# Patient Record
Sex: Male | Born: 1950 | Race: White | Hispanic: No | Marital: Married | Smoking: Never smoker
Health system: Southern US, Community
[De-identification: ages and names within clinical notes are randomized; demographics above are authoritative.]

## PROBLEM LIST (undated history)

## (undated) DIAGNOSIS — R32 Unspecified urinary incontinence: Secondary | ICD-10-CM

## (undated) DIAGNOSIS — Z86711 Personal history of pulmonary embolism: Secondary | ICD-10-CM

## (undated) DIAGNOSIS — Z8719 Personal history of other diseases of the digestive system: Secondary | ICD-10-CM

## (undated) DIAGNOSIS — T7840XA Allergy, unspecified, initial encounter: Secondary | ICD-10-CM

## (undated) DIAGNOSIS — J45909 Unspecified asthma, uncomplicated: Secondary | ICD-10-CM

## (undated) DIAGNOSIS — Z8711 Personal history of peptic ulcer disease: Secondary | ICD-10-CM

## (undated) DIAGNOSIS — R569 Unspecified convulsions: Secondary | ICD-10-CM

## (undated) DIAGNOSIS — J449 Chronic obstructive pulmonary disease, unspecified: Secondary | ICD-10-CM

## (undated) DIAGNOSIS — M1990 Unspecified osteoarthritis, unspecified site: Secondary | ICD-10-CM

## (undated) DIAGNOSIS — IMO0002 Reserved for concepts with insufficient information to code with codable children: Secondary | ICD-10-CM

## (undated) DIAGNOSIS — J189 Pneumonia, unspecified organism: Secondary | ICD-10-CM

## (undated) DIAGNOSIS — Z8619 Personal history of other infectious and parasitic diseases: Secondary | ICD-10-CM

## (undated) DIAGNOSIS — K635 Polyp of colon: Secondary | ICD-10-CM

## (undated) DIAGNOSIS — G4730 Sleep apnea, unspecified: Secondary | ICD-10-CM

## (undated) DIAGNOSIS — G709 Myoneural disorder, unspecified: Secondary | ICD-10-CM

## (undated) DIAGNOSIS — C801 Malignant (primary) neoplasm, unspecified: Secondary | ICD-10-CM

## (undated) DIAGNOSIS — R911 Solitary pulmonary nodule: Secondary | ICD-10-CM

## (undated) DIAGNOSIS — M8580 Other specified disorders of bone density and structure, unspecified site: Secondary | ICD-10-CM

## (undated) DIAGNOSIS — K219 Gastro-esophageal reflux disease without esophagitis: Secondary | ICD-10-CM

## (undated) DIAGNOSIS — H269 Unspecified cataract: Secondary | ICD-10-CM

## (undated) DIAGNOSIS — D689 Coagulation defect, unspecified: Secondary | ICD-10-CM

## (undated) DIAGNOSIS — I10 Essential (primary) hypertension: Secondary | ICD-10-CM

## (undated) DIAGNOSIS — M109 Gout, unspecified: Secondary | ICD-10-CM

## (undated) DIAGNOSIS — E039 Hypothyroidism, unspecified: Secondary | ICD-10-CM

## (undated) DIAGNOSIS — E079 Disorder of thyroid, unspecified: Secondary | ICD-10-CM

## (undated) DIAGNOSIS — E7800 Pure hypercholesterolemia, unspecified: Secondary | ICD-10-CM

## (undated) DIAGNOSIS — G4733 Obstructive sleep apnea (adult) (pediatric): Secondary | ICD-10-CM

## (undated) DIAGNOSIS — Z5189 Encounter for other specified aftercare: Secondary | ICD-10-CM

## (undated) HISTORY — PX: TOOTH EXTRACTION: SUR596

## (undated) HISTORY — DX: Polyp of colon: K63.5

## (undated) HISTORY — PX: JOINT REPLACEMENT: SHX530

## (undated) HISTORY — PX: COLONOSCOPY: SHX174

## (undated) HISTORY — PX: COSMETIC SURGERY: SHX468

## (undated) HISTORY — PX: HIP SURGERY: SHX245

## (undated) HISTORY — DX: Encounter for other specified aftercare: Z51.89

## (undated) HISTORY — DX: Other specified disorders of bone density and structure, unspecified site: M85.80

## (undated) HISTORY — DX: Hypothyroidism, unspecified: E03.9

## (undated) HISTORY — DX: Essential (primary) hypertension: I10

## (undated) HISTORY — DX: Solitary pulmonary nodule: R91.1

## (undated) HISTORY — DX: Unspecified osteoarthritis, unspecified site: M19.90

## (undated) HISTORY — DX: Personal history of other diseases of the digestive system: Z87.19

## (undated) HISTORY — DX: Personal history of pulmonary embolism: Z86.711

## (undated) HISTORY — DX: Pneumonia, unspecified organism: J18.9

## (undated) HISTORY — DX: Myoneural disorder, unspecified: G70.9

## (undated) HISTORY — DX: Personal history of peptic ulcer disease: Z87.11

## (undated) HISTORY — DX: Unspecified urinary incontinence: R32

## (undated) HISTORY — DX: Personal history of other infectious and parasitic diseases: Z86.19

## (undated) HISTORY — DX: Allergy, unspecified, initial encounter: T78.40XA

## (undated) HISTORY — DX: Reserved for concepts with insufficient information to code with codable children: IMO0002

## (undated) HISTORY — DX: Gastro-esophageal reflux disease without esophagitis: K21.9

## (undated) HISTORY — PX: SINOSCOPY: SHX187

## (undated) HISTORY — DX: Unspecified cataract: H26.9

## (undated) HISTORY — DX: Sleep apnea, unspecified: G47.30

## (undated) HISTORY — PX: HERNIA REPAIR: SHX51

## (undated) HISTORY — PX: COLON SURGERY: SHX602

## (undated) HISTORY — PX: BRAIN SURGERY: SHX531

## (undated) HISTORY — DX: Unspecified convulsions: R56.9

## (undated) HISTORY — DX: Chronic obstructive pulmonary disease, unspecified: J44.9

## (undated) HISTORY — DX: Obstructive sleep apnea (adult) (pediatric): G47.33

## (undated) HISTORY — DX: Coagulation defect, unspecified: D68.9

---

## 1998-10-26 ENCOUNTER — Ambulatory Visit (HOSPITAL_COMMUNITY): Admission: RE | Admit: 1998-10-26 | Discharge: 1998-10-26 | Payer: Self-pay | Admitting: *Deleted

## 1998-10-29 ENCOUNTER — Encounter: Payer: Self-pay | Admitting: *Deleted

## 1998-10-29 ENCOUNTER — Ambulatory Visit (HOSPITAL_COMMUNITY): Admission: RE | Admit: 1998-10-29 | Discharge: 1998-10-29 | Payer: Self-pay | Admitting: *Deleted

## 1999-04-26 HISTORY — PX: CHOLECYSTECTOMY: SHX55

## 2002-12-13 ENCOUNTER — Observation Stay (HOSPITAL_COMMUNITY): Admission: EM | Admit: 2002-12-13 | Discharge: 2002-12-14 | Payer: Self-pay | Admitting: Emergency Medicine

## 2002-12-13 ENCOUNTER — Encounter: Payer: Self-pay | Admitting: Emergency Medicine

## 2002-12-13 ENCOUNTER — Encounter (INDEPENDENT_AMBULATORY_CARE_PROVIDER_SITE_OTHER): Payer: Self-pay | Admitting: *Deleted

## 2002-12-13 ENCOUNTER — Encounter: Payer: Self-pay | Admitting: General Surgery

## 2002-12-16 ENCOUNTER — Inpatient Hospital Stay (HOSPITAL_COMMUNITY): Admission: EM | Admit: 2002-12-16 | Discharge: 2002-12-19 | Payer: Self-pay | Admitting: Emergency Medicine

## 2002-12-16 ENCOUNTER — Encounter: Payer: Self-pay | Admitting: Emergency Medicine

## 2002-12-17 ENCOUNTER — Encounter (INDEPENDENT_AMBULATORY_CARE_PROVIDER_SITE_OTHER): Payer: Self-pay | Admitting: *Deleted

## 2005-10-05 ENCOUNTER — Encounter (INDEPENDENT_AMBULATORY_CARE_PROVIDER_SITE_OTHER): Payer: Self-pay | Admitting: Specialist

## 2005-10-05 ENCOUNTER — Ambulatory Visit (HOSPITAL_COMMUNITY): Admission: RE | Admit: 2005-10-05 | Discharge: 2005-10-05 | Payer: Self-pay | Admitting: *Deleted

## 2007-04-26 HISTORY — PX: PROSTATECTOMY: SHX69

## 2007-07-05 ENCOUNTER — Encounter: Payer: Self-pay | Admitting: Urology

## 2007-07-05 ENCOUNTER — Inpatient Hospital Stay (HOSPITAL_COMMUNITY): Admission: RE | Admit: 2007-07-05 | Discharge: 2007-07-06 | Payer: Self-pay | Admitting: Urology

## 2007-07-17 ENCOUNTER — Ambulatory Visit (HOSPITAL_COMMUNITY): Admission: RE | Admit: 2007-07-17 | Discharge: 2007-07-17 | Payer: Self-pay | Admitting: Urology

## 2010-09-07 NOTE — Op Note (Signed)
NAME:  [name], [name]             ACCOUNT NO.:  [id number]   MEDICAL RECORD NO.:  [id number]          PATIENT TYPE:  INP   LOCATION:  0001                         FACILITY:  [facility]   PHYSICIAN:  Thornton Park. Daphine Deutscher, MD  DATE OF BIRTH:  1950-11-18   DATE OF PROCEDURE:  07/05/2007  DATE OF DISCHARGE:                               OPERATIVE REPORT   PREOPERATIVE DIAGNOSIS:  Umbilical hernia.   POSTOPERATIVE DIAGNOSIS:  Umbilical hernia with previous port site  hernia   DESCRIPTION OF PROCEDURE:  Mr. Summerville had undergone a robotic  prostatectomy and after Dr. Annabell Howells had extracted the specimen through the  supraumbilical incision this was extended down into the hernia.  I went  ahead and excised the hernia sac which was beneath the umbilical skin.  Because of the prostatectomy  and although slight chance of  contamination, I  elected to go ahead and close this hernia primarily  rather than put in artificial material.  The edges were well delineated  and were closed with interrupted zero Prolene sutures.  I checked to  make sure nothing was incarcerated prior to doing the final closure, but  all these were tied down and had a nice secure closure present.  The  area was irrigated.  It was infiltrated with half percent Marcaine with  epinephrine and then closed with a stapler.  The patient was taken to  the recovery room in satisfactory condition.      Thornton Park Daphine Deutscher, MD  Electronically Signed     MBM/MEDQ  D:  07/05/2007  T:  07/06/2007  Job:  [id number]

## 2010-09-07 NOTE — Op Note (Signed)
NAME:  Michael [name], Michael [name]             ACCOUNT NO.:  [id number]   MEDICAL RECORD NO.:  [id number]          PATIENT TYPE:  INP   LOCATION:  1436                         FACILITY:  [facility]   PHYSICIAN:  Excell Seltzer. Annabell Howells, M.D.    DATE OF BIRTH:  May 30, 1950   DATE OF PROCEDURE:  07/05/2007  DATE OF DISCHARGE:  07/06/2007                               OPERATIVE REPORT   PROCEDURE:  Robot-assisted laparoscopic radical prostatectomy.   PREOPERATIVE DIAGNOSIS:  Prostate cancer.   POSTOPERATIVE DIAGNOSIS:  Prostate cancer.   SURGEON:  Bjorn Pippin, MD   ASSISTANT:  Heloise Purpura, MD   ANESTHESIA:  General.   SPECIMEN:  Prostate and seminal vesicles.   DRAIN:  A 20-French Foley catheter and #10 Blake drain.   BLOOD LOSS:  350 mL.   COMPLICATIONS:  None.   INDICATIONS:  Michael [name] is a 60 year old white male who was diagnosed  with prostate cancer following an elevated PSA.  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  He is potent and nerve-sparing prostatectomy was chosen for  therapy.   FINDINGS AND PROCEDURE:  The patient received a routine prep bowel prep.  He was given IV Ancef.  He was taken to the operating room, where a  general anesthetic was induced.  He was placed in the low lithotomy  position.  His lower abdomen was clipped.  He was prepped with Betadine  solution.  He was then placed in steep Trendelenburg in the standard  fashion with the Mayo stand over the head to protect the patient's face  and endotracheal tube.  He was then draped in the usual sterile fashion.  A 20-French Foley catheter was inserted.  The balloon was filled with 30  mL of sterile fluid and the bladder was drained.  The top of the pubis  was then marked and then an incision site was marked 18 cm superior to  the pubis just above the umbilicus.  The patient also has an umbilical  hernia and is to undergo an umbilical hernia repair by Dr.  Daphine Deutscher at the  end of the case, so it was felt that the supraumbilical incision was  most appropriate.  This incision was then made with a knife.  The fascia  was opened, allowing entry into the peritoneal cavity.  A finger was  placed to ensure a lack of adhesions.  The abdominal wall was clear.  The 12-mm camera port was placed without difficulty.  The skin and  subcutaneous tissues were secured with a figure-of-eight 0 Vicryl and  the abdomen was insufflated.  Once insufflated, the remaining port sites  were marked in the standard fashion with the 2 left robot ports, the 1  right robot port, a 12-mm working port and 5-mm working port of the  standard configuration.  Once the port sites were marked, they were  infiltrated with local anesthetic, the port site incisions were made and  the trocars were placed under visual guidance.  The robot was then  docked to the ports after  the legs were lowered.  The Foley catheter was  placed to irrigation tubing and the bladder was filled with  approximately 200 mL of fluid.  Using cautery scissors and the PK  dissector, the bladder was taken down off the anterior abdominal wall,  exposing the retropubic space.  The bladder was then drained.  The  endopelvic fascia was incised on each side of the pelvis and the lateral  prostate was exposed bilaterally.  The dissection was carried out to the  groove along the dorsal vein, the puboprostatic were divided and an Endo-  GIA stapler was then used to divide the dorsal vein complex.  We then  turned our attention to the bladder neck, which was identified with the  aid of the Foley balloon.  The bladder neck was then opened transversely  exposed.  Once the Foley catheter was exposed, it was brought up out of  the bladder neck incision and used to provide anterior traction using  the fourth arm.  The posterior bladder neck was then divided with great  care taken to avoid injury to the posterior bladder neck  mucosa and  ureters.  Indigo carmine was given to aid visualization.  Once the  posterior bladder neck was divided, the structures were identified.  The  vas deferens on the left was dissected out and divided and used to  provide anterior traction.  The left seminal vesicle was dissected out  using cautery and sharp dissection.  The right vasa was then dissected  out and divided, followed by the right seminal vesicle.  Once the  structures had been dissected out, they were used to provide anterior  traction.  Once again with the fourth arm, the Denonvilliers' fascia was  incised.  The PK dissector was then used to hold the structures while  the ProGrasp was used to develop the plane between the rectum and the  prostate along the Denonvilliers' fascia.  Once this plan had been fully  developed, we turned our attention to the nerve spare.  The right  prostatic fascia was incised and the neurovascular bundle was developed  off the lateral aspect of the prostate; this was then repeated on the  left side.  Once both nerve spares had been performed, the pedicles were  taken down first on the left using large clips.  There was a prostatic  pedicle vessel that was not initially controlled with a clip that  required a second clip for placement, leading to moderate, but not  excessive blood loss.  The right pedicle was then taken down in  identical fashion.  The residual lateral attachments beyond the pedicle  were taken down sharply.  The remaining dorsal vein complex was divided  using cautery and sharp dissection.  The anterior urethra was opened,  exposing the Foley.  The Foley was backed out, the posterior urethra was  divided and the residual apical attachments were taken down.  The  prostate was then moved out of the pelvic fossa.  The fossa was  irrigated.  The rectum was insufflated with a red rubber catheter and  Asepto syringe that had been placed preoperatively.  No evidence of  rectal  injury was identified.  The fossa was then aspirated free of  irrigant.  Some minor bleeding adjacent to the dorsal vein complex on  each side was controlled with the bipolar.  We then proceeded to the  anastomosis.  A 2-0 Vicryl was used to provide a traction suture and  initially  incorporated the cut edge of Denonvilliers' fascia, the  posterior bladder neck, the urethra and the rectourethralis muscles;  this was brought down using a slip-knot and then tied securely.  We then  used a bicolor 4-0 Monocryl to complete a running vesicourethral  anastomosis without complications.  Irrigation of the bladder following  the anastomosis revealed a watertight closure. The irrigant returned  blue, consistent with indigo carmine excretion.  Once the anastomosis  had been completed and hemostasis was assured, a #10 round Blake drain  was placed through the fourth arm port and the trocar was removed; this  was positioned appropriately in the pelvis.  The robot was then  undocked.  The prostate was then secured in an entrapment sac. The 12-mm  right assistant port was closed using a 0 Vicryl suture and a suture  passer.  The remaining ports were removed under direct vision.  The  umbilical port was then removed.  The fascial incision was extended down  into the umbilical hernia defect and the prostate within the entrapment  sac was removed.  At this point, Dr. Wenda Low came in and completed  an umbilical hernia repair.  The port sites were infiltrated with 0.25%  Marcaine and closed with skin clips.  Dressings were  applied.  The Foley catheter was irrigated with return of blue urine and  no clots and placed to straight drainage.  The patient was taken down  from the lithotomy position, his anesthetic was reversed and he was  moved to the recovery room in stable condition.  There were no  complications.      Excell Seltzer. Annabell Howells, M.D.  Electronically Signed     JJW/MEDQ  D:  07/05/2007  T:   07/07/2007  Job:  [id number]   cc:   Thornton Park Daphine Deutscher, MD  [address]  [place]  [place] [address]

## 2010-09-10 NOTE — H&P (Signed)
NAME:  Michael [name], LYNCH                         ACCOUNT NO.:  [id number]   MEDICAL RECORD NO.:  [id number]                   PATIENT TYPE:  EMS   LOCATION:  MAJO                                 FACILITY:  [facility]   PHYSICIAN:  Gabrielle Dare. Janee Morn, M.D.             DATE OF BIRTH:  08-Jan-1951   DATE OF ADMISSION:  12/13/2002  DATE OF DISCHARGE:                                HISTORY & PHYSICAL   CHIEF COMPLAINT:  Right upper quadrant abdominal pain.   HISTORY OF PRESENT ILLNESS:  The patient is a 60 year old gentleman who  works in [place] and commutes and lives in [place], mostly on the  weekends.  The patient has had some right upper quadrant pain a couple years  ago, but he developed it again on Wednesday when he was in [place].  He  went to see his doctor, and was diagnosed with likely symptomatic  cholelithiasis, and he was referred to come and be evaluated when he came  home.  Since that time, he has been taking some pain medication.  The  initial bad pain, which was on Wednesday night, has gone away, but every  time he eats, he gets a recurrence of symptoms with pain in the right upper  quadrant.  He is not having any fevers, and has no other complaints.   PAST MEDICAL HISTORY:  1. Noninsulin-dependent diabetes.  2. Asthma.   PAST SURGICAL HISTORY:  1. Endoscopic sinus surgery.  2. Tonsillectomy.   MEDICATIONS AT HOME:  1. Advair 500/550.  2. Singulair 10 mg daily.  3. Niaspan 1500 mg p.o. q.h.s.  4. Claritin one tablet q.a.m.   ALLERGIES:  1. PENICILLIN.  2. PHENOBARBITAL.   SOCIAL HISTORY:  He does not smoke, and rarely drinks alcohol.   REVIEW OF SYSTEMS:  GENERAL:  Negative.  PULMONARY:  Negative.  CARDIOVASCULAR:  Negative.  GI:  Please see the history of present illness.  MUSCULOSKELETAL:  Negative.  NEUROPSYCHIATRIC:  Negative.   PHYSICAL EXAMINATION:  VITAL SIGNS:  Temperature 98.9, blood pressure  114/63, pulse 53, respirations 18.  GENERAL:  He  is awake, alert, in no acute distress.  HEENT:  Pupils equal, round and reactive.  Sclerae are non-icteric.  NECK:  Supple.  LUNGS:  Clear to auscultation bilaterally.  HEART:  Regular rate and rhythm.  PMI is palpable in the left chest.  Distal  pulses are 2+ and equal bilaterally.  ABDOMEN:  Soft.  He has a primary ventral hernia with a defect a couple  centimeters above his umbilicus.  This is easily reducible.  He also has  some mild to moderate pain on deep palpation in his right upper quadrant.  No other masses or tenderness are noted.  SKIN:  Warm and dry with no rashes.   LABORATORY DATA:  Data reviewed includes ultrasound which shows  pericholecystic fluid and gallbladder wall thickening.  Labs revealed  amylase 58, lipase 26.  LFTs within normal limits.  Chemistry is also within  normal limits.  White blood cell count normal also.   IMPRESSION AND PLAN:  Acute cholecystitis in this patient who does have  diabetes and two days of pain.  I feel it would be safest to proceed with a  cholecystectomy at this time.  The procedures, risks and benefits including  bleeding, infection, ________ injury were discussed with the patient.  We  will plan on proceeding with laparoscopic cholecystectomy with  intraoperative cholangiogram.  The patient and his wife's questions were  answered.                                                Gabrielle Dare Janee Morn, M.D.    BET/MEDQ  D:  12/13/2002  T:  12/14/2002  Job:  [id number]

## 2010-09-10 NOTE — Discharge Summary (Signed)
   NAME:  Michael [name], Michael [name]                         ACCOUNT NO.:  [id number]   MEDICAL RECORD NO.:  [id number]                   PATIENT TYPE:  INP   LOCATION:  5739                                 FACILITY:  [facility]   PHYSICIAN:  Thornton Park. Daphine Deutscher, M.D.             DATE OF BIRTH:  1950-12-08   DATE OF ADMISSION:  12/16/2002  DATE OF DISCHARGE:  12/19/2002                                 DISCHARGE SUMMARY   ADMITTING DIAGNOSES:  Acute incarceration of epigastric hernia status post  laparoscopic cholecystectomy three days ago.   DISCHARGE DIAGNOSES:  Acute incarceration of epigastric hernia status post  laparoscopic cholecystectomy three days ago with viable bowel.   HOSPITAL COURSE:  Michael [name] is a 60 year old man who had previously  undergone a laparoscopic cholecystectomy on the Friday prior to this  admission.  He went home and started having nausea, vomiting, and abdominal  pain.  He was seen in the emergency room by myself and a CT scan obtained  showed a loop of bowel caught up in an epigastric hernia.  This was not a  trocar site hernia, but was a separate spontaneous epigastric hernia.  The  patient was then taken to the operating room where takedown was performed  and this loop of bowel was found to be not infarcted and it was felt to be  viable, and thus was allowed to pink up and returned to the abdomen.  The  transverse defect was enclosed with Prolene sutures and the patient seemed  to do fine.  He had cessation of pain.  Laboratory was checked and he had  begun having bowel movements and he was started on liquids and was ready for  discharge on December 19, 2002, which was postoperative day #3.  He was doing  well and was asked to return to the office in approximately 1-2 weeks.                                                Thornton Park Daphine Deutscher, M.D.    MBM/MEDQ  D:  12/31/2002  T:  12/31/2002  Job:  [id number]

## 2010-09-10 NOTE — Op Note (Signed)
NAME:  Michael [name], Michael [name]                         ACCOUNT NO.:  [id number]   MEDICAL RECORD NO.:  [id number]                   PATIENT TYPE:  OBV   LOCATION:  5733                                 FACILITY:  [facility]   PHYSICIAN:  Gabrielle Dare. Janee Morn, M.D.             DATE OF BIRTH:  February 11, 1951   DATE OF PROCEDURE:  12/13/2002  DATE OF DISCHARGE:  12/14/2002                                 OPERATIVE REPORT   PREOPERATIVE DIAGNOSIS:  Acute cholecystitis.   POSTOPERATIVE DIAGNOSIS:  Acute cholecystitis.   PROCEDURE:  Laparoscopic cholecystectomy with intraoperative cholangiogram.   SURGEON:  Gabrielle Dare. Janee Morn, M.D.   ASSISTANT:  Ollen Gross. Carolynne Edouard, M.D.   ANESTHESIA:  General.   HISTORY OF PRESENT ILLNESS:  The patient is a 60 year old white male with a  history of asthma and noninsulin-dependent diabetes who had a sudden-onset  development of right upper quadrant pain while working up in [place] on  Wednesday night.  He was evaluated by a physician up there and told to have  this addressed on his return to [facility]  He came to the  emergency room.  Workup revealed acute cholecystitis, and he is brought to  the operating room for urgent cholecystectomy.   PROCEDURE IN DETAIL:  Informed consent was obtained.  The patient received  intravenous antibiotics.  His abdomen was prepped and draped in a sterile  fashion after induction of general anesthesia.  A curvilinear infraumbilical  incision was made.  The patient was noted on preoperative evaluation to have  a primary ventral hernia.  This was avoided.  Subcutaneous tissues were  dissected down, anterior fascia was divided.  The peritoneal cavity was then  entered under direct vision without difficulty.  A 0 Vicryl pursestring  suture was placed around the fascial opening and the Hasson trocar was  inserted in the abdomen.  The abdomen was insufflated with carbon dioxide in  standard fashion.  Subsequently under direct vision an  11 mm epigastric and  two 5 mm lateral ports were placed.  The dome of the gallbladder was  retracted superomedially and the infundibulum was retracted inferolaterally.  The gallbladder was quite inflamed and edematous.  Dissection was begun  laterally and progressed medially, gradually revealing the cystic duct  without difficulty.  A nice window was made between the cystic duct and  infundibulum of the gallbladder and the liver.  During this dissection we  also revealed the cystic artery.  This was circumferentially dissected as  well.  Once the anatomy was clear, we placed a clip in the infundibulum-  cystic duct junction.  A small nick was made in the cystic duct and a  Reddick cholangiogram catheter was inserted.  An intraoperative  cholangiogram was short, which demonstrated no common bile duct filling  defect.  The patient's entire system was notably of small caliber, though,  barely larger than the Reddick cholangiogram catheter.  Once the  cholangiogram was completed, the Reddick cholangiogram catheter was removed  and three clips were placed proximally on the cystic duct and it was  divided.  Subsequently the cystic artery was clipped three times proximally,  once distally, and divided.  At this time the gallbladder was then taken off  the liver bed gradually with Bovie cautery.  Several areas of the liver bed  were cauterized to get excellent hemostasis.  The gallbladder was placed in  an EndoCatch bag and removed via the umbilical port site.  The liver bed was  then cauterized again for some scattered small areas of oozing.  Once  hemostasis was obtained the abdomen was copiously irrigated with 2 L of  saline until the irrigant returned clear and the gallbladder bed was  rechecked and noted to be hemostatic.  A piece of Surgicel was placed in the  gallbladder bed and after checking again that good hemostasis was obtained,  the rest of the irrigation fluid was removed and it was  clear.  Pneumoperitoneum was released.  The trocars were removed under direct vision  and the Hasson trocar was removed.  The anterior umbilical fascia was closed  by tying the pursestring suture, taking care not to entrap any of the intra-  abdominal contents.  Once this was completed, all four wounds were copiously  irrigated.  Marcaine 0.25% with epinephrine had been used for a local  anesthetic at all port sites.  The skin was then closed at each wound with  running 4-0 Vicryl subcuticular stitch.  Sponge, needle, and instrument  counts were correct.  Benzoin and Steri-Strips and sterile dressings were  applied.  The patient tolerated the procedure without any apparent  complications.  He was taken to the recovery room in stable condition.                                               Gabrielle Dare Janee Morn, M.D.    BET/MEDQ  D:  12/14/2002  T:  12/15/2002  Job:  [id number]

## 2010-09-10 NOTE — Op Note (Signed)
NAME:  Michael [name], Michael [name]             ACCOUNT NO.:  [id number]   MEDICAL RECORD NO.:  [id number]          PATIENT TYPE:  AMB   LOCATION:  ENDO                         FACILITY:  [facility]   PHYSICIAN:  Georgiana Spinner, M.D.    DATE OF BIRTH:  [birth year]   DATE OF PROCEDURE:  DATE OF DISCHARGE:                                 OPERATIVE REPORT   PROCEDURE:  Colonoscopy with polypectomy and biopsy.   INDICATIONS:  Colon polyps.   ANESTHESIA:  Demerol 80 and Versed 9 mg.   PROCEDURE:  With the patient mildly sedated in the left lateral decubitus  position, a rectal examination was performed and to my exam was  unremarkable.  Subsequently, the Olympus videoscopic colonoscope PCF-160 was  inserted in the rectum and passed under direct vision to the cecum with  pressure applied to the abdomen.  Cecum was identified by ileocecal valve  and appendiceal orifice and from this point colonoscope was slowly withdrawn  taking circumferential views of colonic mucosa stopping in the right colon  first and cecum, where there were 2 polyps seen and both were removed using  hot biopsy forceps technique, setting of 20/200 blended current.  We next  stopped in the right colon just above the cecum, where 2 more polyps were  seen and 1 was removed using hot biopsy forceps technique as was the second  and a third was removed using snare cautery technique.  It was photographed  prior to its removal.  The endoscope was then withdrawn as noted all the way  to the rectum, taking circumferential views of colonic mucosa stopping only  in the rectum, which appeared normal on direct and showed hemorrhoids on  retroflex view.  The endoscope was straightened and withdrawn.  The  patient's vital signs, pulse oximeter remained stable.  The patient  tolerated procedure well without apparent complications.   FINDINGS:  1.  Internal hemorrhoids,  2.  Diverticulosis mild of sigmoid colon.  3.  Polyps of right colon as  described.   PLAN:  Await biopsy report.  The patient will call me for results and follow-  up with me as an outpatient.           ______________________________  Georgiana Spinner, M.D.     GMO/MEDQ  D:  10/05/2005  T:  10/05/2005  Job:  [id number]

## 2010-09-10 NOTE — Op Note (Signed)
NAME:  Michael [name], Michael [name]                         ACCOUNT NO.:  [id number]   MEDICAL RECORD NO.:  [id number]                   PATIENT TYPE:  INP   LOCATION:  5739                                 FACILITY:  [facility]   PHYSICIAN:  Thornton Park. Daphine Deutscher, M.D.             DATE OF BIRTH:  1950/11/05   DATE OF PROCEDURE:  12/16/2002  DATE OF DISCHARGE:                                 OPERATIVE REPORT   PREOPERATIVE DIAGNOSIS:  Incarcerated epigastric hernia.   POSTOPERATIVE DIAGNOSIS:  Incarcerated epigastric hernia with viable bowel.   PROCEDURE:   SURGEON:  Thornton Park. Daphine Deutscher, M.D.   INDICATIONS:  Michael [name] is a 60 year old white male who underwent a  laparoscopic cholecystectomy on 12/13/02 for acute cholecystitis on December 13, 2002, for an acute cholecystitis having returned from [place] where he had been sick for a few days.  An epigastric hernia  was seen and felt and because of his cholecystitis it was not a good time to  try to repair that.  He was discharged over the weekend but began having  nausea and vomiting.  He was sent to the emergency room this morning and x-  rays suggested a partial small bowel obstruction, and a CT scan was obtained  to see if this could possibly involve a loop of bowel in the umbilical  hernia or any one of the trocar sites or if it was related to the epigastric  hernia.  This hernia involved bowel, so he was brought straight away to the  OR.   DESCRIPTION OF PROCEDURE:  The patient was taken back to room 16 where an  awake intubation was performed.  The abdomen was prepped with Betadine and  draped sterilely.  He received 1 g of Ancef.  I made a transverse incision  and cut down over this and encountered the hernia sac which I dissected free  down to the neck.  I then enlarged the fascial cut transversely and then  opened the incarcerated hernia sac and found the bowel to appear viable but  yet purplish.  After opening the  fascia, I brought it out further and then  put a Babcock clamp on the distal side that appeared normal and  decompressed, and then I allowed this to kind of return in the abdominal  cavity while I cleaned up the fascial defect and removed the hernia sac.  At  the completion of this, I brought everything else out of the body and looked  at the bowel and followed the peristaltic wave, and it seemed to go through  the area that had previously been incarcerated and was not a lot less  purple, and looking much more viable to conduct a peristaltic wave very  adequately, and the bowel appeared viable.  I elected at this point to  return the bowel to the abdominal cavity, and then I repaired the ventral  hernia.  This  was done with 0 Prolene popoffs with seven such sutures placed  transversely and then putting my finger in as I did just to make sure that  it did not encumber anything and these were tied down, and the defect was  completely repaired.  The fascia was injected with 0.5% Marcaine and was  closed with 4-0 Vicryl subcutaneously and with the stapler.  The patient  seemed to tolerate the procedure well.  He was taken to the recovery room in  satisfactory condition on December 16, 2002, at approximately 2035 hours.    FINAL DIAGNOSIS:  Incarcerated epigastric hernia away from the trocar site,  status post reduction of incarceration and return of viable bowel to the  abdomen.  The umbilical trocar site was palpated and appeared to be  unremarkable.  There were no other obvious abnormalities noted through the  limited incision that I made.                                               Thornton Park Daphine Deutscher, M.D.    MBM/MEDQ  D:  12/16/2002  T:  12/17/2002  Job:  [id number]   cc:   [facility]

## 2011-01-17 LAB — BASIC METABOLIC PANEL
BUN: 15
BUN: 7
Calcium: 8.2 — ABNORMAL LOW
Calcium: 9.2
Creatinine, Ser: 1.13
Creatinine, Ser: 1.14
GFR calc non Af Amer: >60
GFR calc non Af Amer: >60
Potassium: 3.8
Potassium: 4.2

## 2011-01-17 LAB — HEMOGLOBIN AND HEMATOCRIT, BLOOD
HCT: 39.6
HCT: 44
Hemoglobin: 13.9
Hemoglobin: 15.5

## 2011-01-17 LAB — DIFFERENTIAL
Basophils Absolute: 0
Basophils Relative: 1
Eosinophils Absolute: 0.1
Eosinophils Relative: 2
Lymphocytes Relative: 19
Lymphs Abs: 1.5
Neutro Abs: 5.7
Neutrophils Relative %: 70

## 2011-01-17 LAB — TYPE AND SCREEN
ABO/RH(D): A NEG
Antibody Screen: NEGATIVE

## 2011-01-17 LAB — CBC
Platelets: 221
WBC: 8.2

## 2011-01-17 LAB — URINALYSIS, ROUTINE W REFLEX MICROSCOPIC
Bilirubin Urine: NEGATIVE
Nitrite: NEGATIVE
Specific Gravity, Urine: 1.014
pH: 6

## 2011-04-26 LAB — HM COLONOSCOPY

## 2011-05-17 ENCOUNTER — Other Ambulatory Visit: Payer: Self-pay | Admitting: Dermatology

## 2012-04-25 HISTORY — PX: SINUS SURGERY WITH INSTATRAK: SHX5215

## 2012-09-11 ENCOUNTER — Emergency Department (HOSPITAL_BASED_OUTPATIENT_CLINIC_OR_DEPARTMENT_OTHER)
Admission: EM | Admit: 2012-09-11 | Discharge: 2012-09-11 | Disposition: A | Discharge status: Home / Self Care | Payer: BC Managed Care – PPO | Attending: Emergency Medicine | Admitting: Emergency Medicine

## 2012-09-11 ENCOUNTER — Encounter (HOSPITAL_BASED_OUTPATIENT_CLINIC_OR_DEPARTMENT_OTHER): Payer: Self-pay | Admitting: Emergency Medicine

## 2012-09-11 DIAGNOSIS — J45901 Unspecified asthma with (acute) exacerbation: Secondary | ICD-10-CM | POA: Insufficient documentation

## 2012-09-11 DIAGNOSIS — R42 Dizziness and giddiness: Secondary | ICD-10-CM | POA: Insufficient documentation

## 2012-09-11 DIAGNOSIS — Z862 Personal history of diseases of the blood and blood-forming organs and certain disorders involving the immune mechanism: Secondary | ICD-10-CM | POA: Insufficient documentation

## 2012-09-11 DIAGNOSIS — T368X5A Adverse effect of other systemic antibiotics, initial encounter: Secondary | ICD-10-CM | POA: Insufficient documentation

## 2012-09-11 DIAGNOSIS — Z8546 Personal history of malignant neoplasm of prostate: Secondary | ICD-10-CM | POA: Insufficient documentation

## 2012-09-11 DIAGNOSIS — Z8639 Personal history of other endocrine, nutritional and metabolic disease: Secondary | ICD-10-CM | POA: Insufficient documentation

## 2012-09-11 DIAGNOSIS — T7840XA Allergy, unspecified, initial encounter: Secondary | ICD-10-CM

## 2012-09-11 HISTORY — DX: Unspecified asthma, uncomplicated: J45.909

## 2012-09-11 HISTORY — DX: Disorder of thyroid, unspecified: E07.9

## 2012-09-11 HISTORY — DX: Malignant (primary) neoplasm, unspecified: C80.1

## 2012-09-11 HISTORY — DX: Pure hypercholesterolemia, unspecified: E78.00

## 2012-09-11 HISTORY — DX: Gout, unspecified: M10.9

## 2012-09-11 MED ORDER — PREDNISONE 10 MG PO TABS: 50.0000 mg | ORAL_TABLET | Freq: Every day | ORAL | Status: DC

## 2012-09-11 MED ORDER — PREDNISONE 50 MG PO TABS
60.0000 mg | ORAL_TABLET | Freq: Once | ORAL | Status: AC
  Administered 2012-09-11: 60 mg via ORAL
  Filled 2012-09-11: qty 1

## 2012-09-11 MED ORDER — DIPHENHYDRAMINE HCL 25 MG PO CAPS
50.0000 mg | ORAL_CAPSULE | Freq: Once | ORAL | Status: AC
  Administered 2012-09-11: 50 mg via ORAL
  Filled 2012-09-11: qty 2

## 2012-09-11 NOTE — ED Provider Notes (Signed)
History     CSN: [id number]  Arrival date & time 09/11/12  1637   First MD Initiated Contact with Patient 09/11/12 1645      Chief Complaint  Patient presents with  . Chest Pain    (Consider location/radiation/quality/duration/timing/severity/associated sxs/prior treatment) Patient is a 62 y.o. male presenting with chest pain.  Chest Pain Associated symptoms: shortness of breath    Patient saw his primary care physician Dr.Pang earlier today for sinus drainage and "asthma attack". He  was treated with with his steroid injection and with Avelox at 4 PM today. Upon driving home from the hospital he became lightheaded developed diffuse redness developed shortness of breath and had chest pain at left anterior chest lasting 10 minutes. He arrived home and was generally tremulous. He feels improved now without further treatment. History reviewed. No pertinent past medical history. Past medical history asthma prostate cancer gout No past surgical history on file.  No family history on file.  History  Substance Use Topics  . Smoking status: Not on file  . Smokeless tobacco: Not on file  . Alcohol Use: Not on file   no tobacco no alcohol no drugs    Review of Systems  Constitutional: Negative.   HENT: Positive for congestion.        Sinus congestion for approximately one week  Respiratory: Positive for shortness of breath.   Cardiovascular: Positive for chest pain.  Gastrointestinal: Negative.   Musculoskeletal: Negative.   Skin: Positive for color change.  Neurological: Positive for light-headedness.  Psychiatric/Behavioral: Negative.   All other systems reviewed and are negative.    Allergies  Review of patient's allergies indicates not on file.  Home Medications  No current outpatient prescriptions on file.  Pulse 84  Resp 30  Ht 5\' 8"  (1.727 m)  Wt 225 lb (102.059 kg)  BMI 34.22 kg/m2  SpO2 100%  Physical Exam  Nursing note and vitals  reviewed. Constitutional: He appears well-developed and well-nourished.  HENT:  Head: Normocephalic and atraumatic.  No swelling of tongue lips or uvula  Eyes: Conjunctivae are normal. Pupils are equal, round, and reactive to light.  Neck: Neck supple. No tracheal deviation present. No thyromegaly present.  Cardiovascular: Normal rate and regular rhythm.   No murmur heard. Pulmonary/Chest: Effort normal and breath sounds normal.  Abdominal: Soft. Bowel sounds are normal. He exhibits no distension. There is no tenderness.  Musculoskeletal: Normal range of motion. He exhibits no edema and no tenderness.  Neurological: He is alert. Coordination normal.  Skin: Skin is warm and dry. No rash noted.  Reddened face trunk and extremities in large patchy areas.  Psychiatric: He has a normal mood and affect.    ED Course  Procedures (including critical care time)  Date: 09/11/2012  Rate: 80  Rhythm: normal sinus rhythm  QRS Axis: normal  Intervals: normal  ST/T Wave abnormalities: nonspecific T wave changes  Conduction Disutrbances:none  Narrative Interpretation:   Old EKG Reviewed: Unchanged from 06/29/2007 interpreted by me 7:30 PM patient resting comfortably. Asymptomatic. 8:33 resting comfortably. Asymptomatic. Watching television   10 PM rash is minimal patient is  Medical decision-making: I believe the patient had allergic reaction to Avelox Plan Benadryl 50 mg every 6 hours as needed for rash Stop Avelox patient's instructed to tell all of his physicians that he is allergic Prescription prednisone 50 mg daily for 3 days starting tomorrow Diagnosis allergic reaction  Doug Sou, MD 09/11/12 2208

## 2012-09-11 NOTE — ED Notes (Signed)
Patient ambulates to restroom without difficulty or assistance.  

## 2012-09-11 NOTE — ED Notes (Signed)
SOB all day with no relief from home meds.  Saw PMD and received Prednisone shot and Avelox.  CP started on the way home. No radiation. Intermittent pain.  Feels like he is going to pass out.  Skin flushed. Pt trembling.

## 2012-09-11 NOTE — ED Notes (Signed)
Pt reports feeling better, vs stable, respirations normal, pain to left shoulder resolves with position changes

## 2012-09-11 NOTE — ED Notes (Signed)
MD at bedside. 

## 2012-12-27 ENCOUNTER — Other Ambulatory Visit: Payer: Self-pay | Admitting: Dermatology

## 2013-04-25 DIAGNOSIS — G4733 Obstructive sleep apnea (adult) (pediatric): Secondary | ICD-10-CM

## 2013-04-25 HISTORY — DX: Obstructive sleep apnea (adult) (pediatric): G47.33

## 2014-04-25 DIAGNOSIS — Z86711 Personal history of pulmonary embolism: Secondary | ICD-10-CM

## 2014-04-25 DIAGNOSIS — Z8719 Personal history of other diseases of the digestive system: Secondary | ICD-10-CM

## 2014-04-25 HISTORY — DX: Personal history of pulmonary embolism: Z86.711

## 2014-04-25 HISTORY — DX: Personal history of other diseases of the digestive system: Z87.19

## 2014-04-25 HISTORY — PX: PENILE PROSTHESIS IMPLANT: SHX240

## 2015-04-26 HISTORY — PX: BASAL CELL CARCINOMA EXCISION: SHX1214

## 2016-06-23 HISTORY — PX: TOTAL HIP ARTHROPLASTY: SHX124

## 2017-01-23 HISTORY — PX: SQUAMOUS CELL CARCINOMA EXCISION: SHX2433

## 2017-02-09 ENCOUNTER — Encounter: Payer: Self-pay | Admitting: Allergy and Immunology

## 2017-02-09 ENCOUNTER — Ambulatory Visit (INDEPENDENT_AMBULATORY_CARE_PROVIDER_SITE_OTHER): Payer: Managed Care, Other (non HMO) | Admitting: Allergy and Immunology

## 2017-02-09 VITALS — BP 140/82 | HR 67 | Temp 98.0°F | Resp 16 | Ht 65.75 in | Wt 199.3 lb

## 2017-02-09 DIAGNOSIS — J3089 Other allergic rhinitis: Secondary | ICD-10-CM | POA: Diagnosis not present

## 2017-02-09 DIAGNOSIS — Z888 Allergy status to other drugs, medicaments and biological substances status: Secondary | ICD-10-CM | POA: Diagnosis not present

## 2017-02-09 DIAGNOSIS — Z886 Allergy status to analgesic agent status: Secondary | ICD-10-CM

## 2017-02-09 DIAGNOSIS — J309 Allergic rhinitis, unspecified: Secondary | ICD-10-CM | POA: Insufficient documentation

## 2017-02-09 DIAGNOSIS — Z8709 Personal history of other diseases of the respiratory system: Secondary | ICD-10-CM | POA: Diagnosis not present

## 2017-02-09 DIAGNOSIS — J320 Chronic maxillary sinusitis: Secondary | ICD-10-CM

## 2017-02-09 DIAGNOSIS — Z881 Allergy status to other antibiotic agents status: Secondary | ICD-10-CM | POA: Insufficient documentation

## 2017-02-09 DIAGNOSIS — J4550 Severe persistent asthma, uncomplicated: Secondary | ICD-10-CM | POA: Insufficient documentation

## 2017-02-09 DIAGNOSIS — J329 Chronic sinusitis, unspecified: Secondary | ICD-10-CM | POA: Insufficient documentation

## 2017-02-09 MED ORDER — MONTELUKAST SODIUM 10 MG PO TABS: 10.0000 mg | ORAL_TABLET | Freq: Every day | ORAL | 3 refills | Status: DC

## 2017-02-09 MED ORDER — MOMETASONE FURO-FORMOTEROL FUM 200-5 MCG/ACT IN AERO: 2.0000 | INHALATION_SPRAY | Freq: Two times a day (BID) | RESPIRATORY_TRACT | 3 refills | Status: DC

## 2017-02-09 MED ORDER — TIOTROPIUM BROMIDE MONOHYDRATE 1.25 MCG/ACT IN AERS: 2.0000 | INHALATION_SPRAY | Freq: Every day | RESPIRATORY_TRACT | 3 refills | Status: DC

## 2017-02-09 MED ORDER — FLUTICASONE PROPIONATE 93 MCG/ACT NA EXHU: 1.0000 | INHALANT_SUSPENSION | Freq: Two times a day (BID) | NASAL | 5 refills | Status: DC

## 2017-02-09 NOTE — Assessment & Plan Note (Signed)
   Continue appropriate allergen avoidance measures and montelukast 10 mg daily bedtime.  A prescription has been provided for [facility], 2 actuations per nostril twice a day. Proper technique has been discussed and demonstrated.  I have also recommended nasal saline spray (i.e., Simply Saline) or nasal saline lavage (i.e., NeilMed) as needed and prior to medicated nasal sprays.

## 2017-02-09 NOTE — Patient Instructions (Addendum)
Severe persistent asthma Currently with suboptimal control.  Lab order form has been provided for CBC with differential to assess candidacy for benralizumab or mepolizomab.  A prescription has been provided for [facility] (mometasone/formoterol) 200/5 g,  2 inhalations via spacer device twice a day.  A prescription has been provided for Spiriva Respimat 1.25 g, 2 inhalations daily.  Continue montelukast 10 mg daily at bedtime and DuoNeb every 6 hours if needed.  Discontinue Advair and Qvar.  Subjective and objective measures of pulmonary function will be followed and the treatment plan will be adjusted accordingly.  Allergic rhinitis  Continue appropriate allergen avoidance measures and montelukast 10 mg daily bedtime.  A prescription has been provided for [facility], 2 actuations per nostril twice a day. Proper technique has been discussed and demonstrated.  I have also recommended nasal saline spray (i.e., Simply Saline) or nasal saline lavage (i.e., NeilMed) as needed and prior to medicated nasal sprays.  History of nasal polyp  Xhance and nasal saline irrigation (as above).  The importance of consistent daily use of a nasal steroid is emphasized.  Aspirin sensitivity Persistent asthma, history of nasal polyposis, and aspirin sensitivity.  Aspirin triad.  Continue avoidance of aspirin and other related NSAIDs.  Acetaminophen may be used if needed.   Return in about 2 months (around 04/11/2017), or if symptoms worsen or fail to improve.

## 2017-02-09 NOTE — Assessment & Plan Note (Signed)
Currently with suboptimal control.  Lab order form has been provided for CBC with differential to assess candidacy for benralizumab or mepolizomab.  A prescription has been provided for [facility] (mometasone/formoterol) 200/5 g, 2 inhalations via spacer device twice a day.  A prescription has been provided for Spiriva Respimat 1.25 g, 2 inhalations daily.  Continue montelukast 10 mg daily at bedtime and DuoNeb every 6 hours if needed.  Discontinue Advair and Qvar.  Subjective and objective measures of pulmonary function will be followed and the treatment plan will be adjusted accordingly.

## 2017-02-09 NOTE — Progress Notes (Signed)
New Patient Note  RE: Michael [name] MRN: [id number] DOB: 03/25/1951 Date of Office Visit: 02/09/2017  Referring provider: No ref. provider found Primary care provider: Debbrah Alar, NP  Chief Complaint: Asthma and Sinus Problem   History of present illness: Michael [name] is a 66 y.o. male with a complex medical history including severe persistent asthma on omalizumab therapy, chronic sinusitis, history of nasal polyposis presenting today for his initial evaluation.  He is transferring his care from [place].  He reports that he was diagnosed with asthma when he was 66 years old.  His asthma symptoms consist of coughing, dyspnea, chest tightness, and wheezing, however coughing has been his most prominent feature over the past few years.  He states that his asthma is so severe that he is "lucky to be alive."  He currently receives omalizumab injections every 28 days and in addition takes Advair 230-21 g, 2 inhalations via spacer device twice a day, Qvar 80 g, 2 inhalations via spacer device twice a day, montelukast 10 mg daily at bedtime, and Combivent or DuoNeb every 6 hours as needed.  Despite these medications, he reports that his asthma is still poorly controlled and that he requires frequent courses of systemic steroids.  He states that he typically receives 80 mg Kenalog injection with every Xolair injection and requires prednisone burst/taper in addition.  He has just finished a prednisone taper which she has been taking over the past 2 or 3 weeks.  His asthma symptoms are more active during the spring and fall, with fall being his worst season.  Typically in September he requires DuoNeb 2 or 3 times per day.  He was started on the Xolair approximately one and half years ago but states that he would like to switch off of this medication if possible because of lack of efficacy and his concerns regarding potential side effects.  He reports that he had CBC with differential  drawn in March 2015 with 114 eosinophil count while on systemic steroids and in November 2015 the eosinophil count was 146 while on systemic steroids.  He reports that taking aspirin seems to exacerbate his asthma, so he typically takes acetaminophen when needed. He reports that he has had 4 surgical procedures on his sinuses, the most recent having taken place in 2015, including polypectomy.  He switches back and forth between Flonase nasal spray and Nasacort AQ still experiences nasal congestion and reports that his sense of smell is diminishing.   Assessment and plan: Severe persistent asthma Currently with suboptimal control.  Lab order form has been provided for CBC with differential to assess candidacy for benralizumab or mepolizomab.  A prescription has been provided for [facility] (mometasone/formoterol) 200/5 g,  2 inhalations via spacer device twice a day.  A prescription has been provided for Spiriva Respimat 1.25 g, 2 inhalations daily.  Continue montelukast 10 mg daily at bedtime and DuoNeb every 6 hours if needed.  Discontinue Advair and Qvar.  Subjective and objective measures of pulmonary function will be followed and the treatment plan will be adjusted accordingly.  Allergic rhinitis  Continue appropriate allergen avoidance measures and montelukast 10 mg daily bedtime.  A prescription has been provided for [facility], 2 actuations per nostril twice a day. Proper technique has been discussed and demonstrated.  I have also recommended nasal saline spray (i.e., Simply Saline) or nasal saline lavage (i.e., NeilMed) as needed and prior to medicated nasal sprays.  History of nasal polyp  Xhance and nasal saline  irrigation (as above).  The importance of consistent daily use of a nasal steroid is emphasized.  Aspirin sensitivity Persistent asthma, history of nasal polyposis, and aspirin sensitivity.  Aspirin triad.  Continue avoidance of aspirin and other related  NSAIDs.  Acetaminophen may be used if needed.   Meds ordered this encounter  Medications  . mometasone-formoterol (DULERA) 200-5 MCG/ACT AERO    Sig: Inhale 2 puffs into the lungs 2 (two) times daily.    Dispense:  3 Inhaler    Refill:  3  . Tiotropium Bromide Monohydrate (SPIRIVA RESPIMAT) 1.25 MCG/ACT AERS    Sig: Inhale 2 puffs into the lungs daily.    Dispense:  3 Inhaler    Refill:  3  . montelukast (SINGULAIR) 10 MG tablet    Sig: Take 1 tablet (10 mg total) by mouth at bedtime.    Dispense:  90 tablet    Refill:  3  . Fluticasone Propionate (XHANCE) 93 MCG/ACT EXHU    Sig: Place 1 spray into the nose 2 (two) times daily.    Dispense:  32 mL    Refill:  5    Diagnostics: Spirometry: FVC was 3.53 L (sees 88% predicted) with an FEV1 of 2.27 L (76% predicted), FEV1 ratio of 84% with 230 mL postbronchodilator improvement.   Please see scanned spirometry results for details.    Physical examination: Blood pressure 140/82, pulse 67, temperature 98 F (36.7 C), temperature source Oral, resp. rate 16, height 5' 5.75" (1.67 m), weight 199 lb 4.7 oz (90.4 kg), SpO2 96 %.  General: Alert, interactive, in no acute distress. HEENT: TMs pearly gray, turbinates moderately edematous with thick discharge, post-pharynx mildly erythematous. Neck: Supple without lymphadenopathy. Lungs: Mildly decreased breath sounds bilaterally without wheezing, rhonchi or rales. CV: Normal S1, S2 without murmurs. Abdomen: Nondistended, nontender. Skin: Warm and dry, without lesions or rashes. Thin skin and bruising of the upper extremities. Extremities:  No clubbing, cyanosis or edema. Neuro:   Grossly intact.  Review of systems:  Review of systems negative except as noted in HPI / PMHx or noted below: Review of Systems  Constitutional: Negative.   HENT: Negative.   Eyes: Negative.   Respiratory: Negative.   Cardiovascular: Negative.   Gastrointestinal: Negative.   Genitourinary: Negative.    Musculoskeletal: Negative.   Skin: Negative.   Neurological: Negative.   Endo/Heme/Allergies: Negative.   Psychiatric/Behavioral: Negative.     Past medical history:  Past Medical History:  Diagnosis Date  . Asthma   . Cancer ([place])   . Gout   . High cholesterol   . Thyroid disease     Past surgical history:  Past Surgical History:  Procedure Laterality Date  . PENILE PROSTHESIS IMPLANT  2016  . PROSTATECTOMY    . SINUS SURGERY WITH INSTATRAK  2014  . TOTAL HIP ARTHROPLASTY  2018    Family history: Family History  Problem Relation Age of Onset  . Asthma Mother   . Asthma Father     Social history: Social History   Social History  . Marital status: Married    Spouse name: N/A  . Number of children: N/A  . Years of education: N/A   Occupational History  . Not on file.   Social History Main Topics  . Smoking status: Never Smoker  . Smokeless tobacco: Never Used  . Alcohol use No  . Drug use: No  . Sexual activity: Not on file   Other Topics Concern  . Not on file  Social History Narrative  . No narrative on file   Environmental History: The patient lives in a 64 year old house with carpeting in the bedroom, gassy, and central air.  There are 3 dogs in the home which do not have access to his bedroom.  He is a nonsmoker.  There is no known mold/water damage in his home.  Allergies as of 02/09/2017      Reactions   Colchicine Other (See Comments)   Pancreatitis   Moxifloxacin Palpitations   Doxycycline Hives   Statins Other (See Comments)   Avelox [moxifloxacin Hcl In Nacl]    Penicillins    Adhesive  [tape] Other (See Comments)   "tears skin"      Medication List       Accurate as of 02/09/17  6:06 PM. Always use your most recent med list.          ADVAIR HFA 230-21 MCG/ACT inhaler Generic drug:  fluticasone-salmeterol Inhale into the lungs.   albuterol 108 (90 Base) MCG/ACT inhaler Commonly known as:  PROVENTIL HFA;VENTOLIN  HFA Inhale into the lungs.   beclomethasone 80 MCG/ACT inhaler Commonly known as:  QVAR Inhale into the lungs.   docusate sodium 100 MG capsule Commonly known as:  COLACE Take 200 mg by mouth.   EPINEPHrine 0.3 mg/0.3 mL Soaj injection Commonly known as:  EPI-PEN Inject 0.3 mg into the muscle.   febuxostat 40 MG tablet Commonly known as:  ULORIC TAKE 1 TABLET BY MOUTH EVERY DAY   fexofenadine 180 MG tablet Commonly known as:  ALLEGRA Take 180 mg by mouth.   Fluticasone Propionate 93 MCG/ACT Exhu Commonly known as:  XHANCE Place 1 spray into the nose 2 (two) times daily.   Ipratropium-Albuterol 20-100 MCG/ACT Aers respimat Commonly known as:  COMBIVENT Inhale into the lungs.   ipratropium-albuterol 0.5-2.5 (3) MG/3ML Soln Commonly known as:  DUONEB 3 mLs.   levothyroxine 75 MCG tablet Commonly known as:  SYNTHROID, LEVOTHROID Take 75 mcg by mouth daily before breakfast.   losartan 25 MG tablet Commonly known as:  COZAAR TAKE 1 TABLET BY MOUTH DAILY   MAGNESIUM PO Take 500 mg by mouth.   mometasone-formoterol 200-5 MCG/ACT Aero Commonly known as:  DULERA Inhale 2 puffs into the lungs 2 (two) times daily.   montelukast 10 MG tablet Commonly known as:  SINGULAIR TAKE 1 TABLET BY MOUTH NIGHTLY.   montelukast 10 MG tablet Commonly known as:  SINGULAIR Take 1 tablet (10 mg total) by mouth at bedtime.   mupirocin ointment 2 % Commonly known as:  BACTROBAN Apply topically.   OCUVITE EXTRA PO Take by mouth.   omalizumab 150 MG injection Commonly known as:  XOLAIR Inject 300 mg into the skin.   predniSONE 10 MG tablet Commonly known as:  DELTASONE Take 5 tablets (50 mg total) by mouth daily.   Tiotropium Bromide Monohydrate 1.25 MCG/ACT Aers Commonly known as:  SPIRIVA RESPIMAT Inhale 2 puffs into the lungs daily.   Vitamin D3 5000 units Caps Take by mouth.       Known medication allergies: Allergies  Allergen Reactions  . Colchicine Other  (See Comments)    Pancreatitis  . Moxifloxacin Palpitations  . Doxycycline Hives  . Statins Other (See Comments)  . Avelox [Moxifloxacin Hcl In Nacl]   . Penicillins   . Adhesive  [Tape] Other (See Comments)    "tears skin"    I appreciate the opportunity to take part in Salem's care. Please do not hesitate to contact  me with questions.  Sincerely,   R. Edgar Frisk, MD

## 2017-02-09 NOTE — Assessment & Plan Note (Addendum)
   Xhance and nasal saline irrigation (as above).  The importance of consistent daily use of a nasal steroid is emphasized.

## 2017-02-09 NOTE — Assessment & Plan Note (Signed)
Persistent asthma, history of nasal polyposis, and aspirin sensitivity.  Aspirin triad.  Continue avoidance of aspirin and other related NSAIDs.  Acetaminophen may be used if needed.

## 2017-02-10 ENCOUNTER — Telehealth: Payer: Self-pay | Admitting: *Deleted

## 2017-02-10 LAB — CBC WITH DIFFERENTIAL/PLATELET
Basophils Absolute: 0 10*3/uL (ref 0.0–0.2)
Basos: 0 %
EOS (ABSOLUTE): 0.1 10*3/uL (ref 0.0–0.4)
Eos: 0 %
Hematocrit: 45.3 % (ref 37.5–51.0)
Hemoglobin: 15.4 g/dL (ref 13.0–17.7)
Immature Grans (Abs): 0.1 10*3/uL (ref 0.0–0.1)
Immature Granulocytes: 1 %
Lymphocytes Absolute: 1.5 10*3/uL (ref 0.7–3.1)
Lymphs: 12 %
MCH: 32 pg (ref 26.6–33.0)
MCHC: 34 g/dL (ref 31.5–35.7)
MCV: 94 fL (ref 79–97)
Monocytes Absolute: 1 10*3/uL — ABNORMAL HIGH (ref 0.1–0.9)
Monocytes: 8 %
Neutrophils Absolute: 9.9 10*3/uL — ABNORMAL HIGH (ref 1.4–7.0)
Neutrophils: 79 %
Platelets: 340 10*3/uL (ref 150–379)
RBC: 4.82 x10E6/uL (ref 4.14–5.80)
RDW: 13.2 % (ref 12.3–15.4)
WBC: 12.5 10*3/uL — ABNORMAL HIGH (ref 3.4–10.8)

## 2017-02-10 NOTE — Telephone Encounter (Signed)
Plan does not cover Spiriva respimat 1.6mcg. Alternatives include incruse ellipta and anoro ellipta. Please advise.

## 2017-02-13 ENCOUNTER — Other Ambulatory Visit: Payer: Self-pay

## 2017-02-13 MED ORDER — UMECLIDINIUM BROMIDE 62.5 MCG/INH IN AEPB: 1.0000 | INHALATION_SPRAY | Freq: Every day | RESPIRATORY_TRACT | 5 refills | Status: DC

## 2017-02-13 NOTE — Telephone Encounter (Signed)
Will call Incruse in to pharmacy but pt also wanted to know if there was an alternative to [facility]. Please advise

## 2017-02-13 NOTE — Telephone Encounter (Signed)
What does his insurance cover to replace Dulera?

## 2017-02-13 NOTE — Telephone Encounter (Signed)
Sent in incruse

## 2017-02-13 NOTE — Telephone Encounter (Signed)
Incruse, 1 puff daily

## 2017-02-14 ENCOUNTER — Telehealth: Payer: Self-pay | Admitting: Allergy

## 2017-02-14 MED ORDER — BUDESONIDE-FORMOTEROL FUMARATE 160-4.5 MCG/ACT IN AERO: 2.0000 | INHALATION_SPRAY | Freq: Two times a day (BID) | RESPIRATORY_TRACT | 5 refills | Status: DC

## 2017-02-14 NOTE — Telephone Encounter (Signed)
Faxed in prescription and will informed patient.

## 2017-02-14 NOTE — Telephone Encounter (Signed)
Ok this is what I found on [employer]'s formulary, fluticasone propionate susp and budesonide susp both tier 2s

## 2017-02-14 NOTE — Telephone Encounter (Signed)
Confirm directions. Is it two puffs twice a day?

## 2017-02-14 NOTE — Telephone Encounter (Signed)
Dr. Verlin Fester, talked with insurance company and they said patient had to try Symbicort, Breo,or Fluticasone-Salmeterol powder before they will approve Dulera.

## 2017-02-14 NOTE — Telephone Encounter (Signed)
symbicort 160 

## 2017-02-16 ENCOUNTER — Telehealth: Payer: Self-pay

## 2017-02-16 ENCOUNTER — Ambulatory Visit: Payer: Self-pay | Admitting: Allergy and Immunology

## 2017-02-16 NOTE — Telephone Encounter (Signed)
Received a fax from pt.'s Pharmacy [facility] [place] for refill on dulera in which his insurance doesn't cover. Left message for  Pt. That I left a sample of symbicort 160 mcg and a discount card at front desk for pt. To pick up and will send into pt.'s pharmacy along with refill on spiriva respimat 1.25. Linda did call pt. And he is aware of medication change to symbicort 125mcg. I will call pharmacy regarding refills on these medications listed above.

## 2017-02-16 NOTE — Telephone Encounter (Signed)
Spoke to rachel the pharmacist and the incruse ellipta was taken the place of the spiriva respimat. Will call pt. Back to let him know that the incruse ellipta was sent in on October to the [facility] [place].

## 2017-02-16 NOTE — Telephone Encounter (Signed)
Spoke to pt. He is aware of the medication changes. Told pt. His symbicort 160 has been sent to his pharmacy  I told pt. I will leave a sample of symbicort 160 and dc card at front desk for him to pick up. Pt. Aware and know we are closed from 12:30-1:30 for lunch.

## 2017-03-01 ENCOUNTER — Ambulatory Visit: Payer: Self-pay | Admitting: Family

## 2017-03-10 ENCOUNTER — Ambulatory Visit: Payer: Self-pay | Admitting: Family

## 2017-03-10 ENCOUNTER — Telehealth: Payer: Self-pay | Admitting: Family

## 2017-03-10 NOTE — Telephone Encounter (Signed)
No charge. 
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
## 2017-03-20 ENCOUNTER — Ambulatory Visit: Payer: Self-pay | Admitting: Family

## 2017-03-24 ENCOUNTER — Encounter: Payer: Self-pay | Admitting: Family

## 2017-03-24 ENCOUNTER — Ambulatory Visit: Payer: Managed Care, Other (non HMO) | Admitting: Family

## 2017-03-24 VITALS — BP 141/98 | HR 65 | Temp 98.2°F | Resp 16 | Ht 66.0 in | Wt 209.2 lb

## 2017-03-24 DIAGNOSIS — J4550 Severe persistent asthma, uncomplicated: Secondary | ICD-10-CM | POA: Diagnosis not present

## 2017-03-24 DIAGNOSIS — I10 Essential (primary) hypertension: Secondary | ICD-10-CM

## 2017-03-24 DIAGNOSIS — Z86711 Personal history of pulmonary embolism: Secondary | ICD-10-CM | POA: Diagnosis not present

## 2017-03-24 DIAGNOSIS — M1A9XX0 Chronic gout, unspecified, without tophus (tophi): Secondary | ICD-10-CM | POA: Diagnosis not present

## 2017-03-24 DIAGNOSIS — J3089 Other allergic rhinitis: Secondary | ICD-10-CM

## 2017-03-24 DIAGNOSIS — G4733 Obstructive sleep apnea (adult) (pediatric): Secondary | ICD-10-CM | POA: Diagnosis not present

## 2017-03-24 DIAGNOSIS — C4492 Squamous cell carcinoma of skin, unspecified: Secondary | ICD-10-CM | POA: Diagnosis not present

## 2017-03-24 DIAGNOSIS — C61 Malignant neoplasm of prostate: Secondary | ICD-10-CM

## 2017-03-24 DIAGNOSIS — IMO0002 Reserved for concepts with insufficient information to code with codable children: Secondary | ICD-10-CM | POA: Insufficient documentation

## 2017-03-24 DIAGNOSIS — Z8546 Personal history of malignant neoplasm of prostate: Secondary | ICD-10-CM | POA: Insufficient documentation

## 2017-03-24 DIAGNOSIS — Z860100 Personal history of colon polyps, unspecified: Secondary | ICD-10-CM

## 2017-03-24 DIAGNOSIS — M109 Gout, unspecified: Secondary | ICD-10-CM | POA: Insufficient documentation

## 2017-03-24 DIAGNOSIS — Z1159 Encounter for screening for other viral diseases: Secondary | ICD-10-CM

## 2017-03-24 DIAGNOSIS — E039 Hypothyroidism, unspecified: Secondary | ICD-10-CM | POA: Diagnosis not present

## 2017-03-24 DIAGNOSIS — E785 Hyperlipidemia, unspecified: Secondary | ICD-10-CM

## 2017-03-24 LAB — LIPID PANEL
CHOLESTEROL: 198 mg/dL (ref 0–200)
HDL: 43.2 mg/dL (ref >39.00)
LDL CALC: 125 mg/dL — AB (ref 0–99)
NonHDL: 155.25
Total CHOL/HDL Ratio: 5
Triglycerides: 151 mg/dL — ABNORMAL HIGH (ref 0.0–149.0)
VLDL: 30.2 mg/dL (ref 0.0–40.0)

## 2017-03-24 LAB — COMPREHENSIVE METABOLIC PANEL
ALT: 15 U/L (ref 0–53)
AST: 12 U/L (ref 0–37)
Albumin: 4.2 g/dL (ref 3.5–5.2)
Alkaline Phosphatase: 58 U/L (ref 39–117)
BILIRUBIN TOTAL: 0.6 mg/dL (ref 0.2–1.2)
BUN: 13 mg/dL (ref 6–23)
CO2: 31 meq/L (ref 19–32)
Calcium: 9.6 mg/dL (ref 8.4–10.5)
Chloride: 104 mEq/L (ref 96–112)
Creatinine, Ser: 1.1 mg/dL (ref 0.40–1.50)
GFR: 71.43 mL/min (ref >60.00)
GLUCOSE: 98 mg/dL (ref 70–99)
POTASSIUM: 4.3 meq/L (ref 3.5–5.1)
Sodium: 139 mEq/L (ref 135–145)
Total Protein: 6.8 g/dL (ref 6.0–8.3)

## 2017-03-24 LAB — TSH: TSH: 2.27 u[IU]/mL (ref 0.35–4.50)

## 2017-03-24 MED ORDER — LOSARTAN POTASSIUM 25 MG PO TABS: 25.0000 mg | ORAL_TABLET | Freq: Every day | ORAL | 1 refills | Status: DC

## 2017-03-24 MED ORDER — FEBUXOSTAT 40 MG PO TABS: 40.0000 mg | ORAL_TABLET | Freq: Every day | ORAL | 1 refills | Status: DC

## 2017-03-24 NOTE — Assessment & Plan Note (Signed)
Completed 6 month course of xarelto.  Happened following surgery/5 hour plane ride.  (2016).

## 2017-03-24 NOTE — Assessment & Plan Note (Signed)
Stable/improved, management per Dr. Verlin Fester.

## 2017-03-24 NOTE — Patient Instructions (Signed)
Please complete lab work prior to leaving.   

## 2017-03-24 NOTE — Assessment & Plan Note (Signed)
Reports he is due for follow up colo- will refer to GI.

## 2017-03-24 NOTE — Assessment & Plan Note (Signed)
Stable on current meds, management per Dr. Verlin Fester.

## 2017-03-24 NOTE — Assessment & Plan Note (Signed)
Nasal dorsum, s/p Moh's procedure 10/18.

## 2017-03-24 NOTE — Assessment & Plan Note (Signed)
Obtain follow up TSH.  

## 2017-03-24 NOTE — Progress Notes (Signed)
Subjective:    Patient ID: Michael [name], male    DOB: July 12, 1950, 66 y.o.   MRN: [id number]  HPI   Michael [name] is a 66 yr old male who presents today to establish care.   Pmhx is significant for the following:  Thyroid disease- (hypothyroid)- diagnosed about 15 years ago. Reports that it has been fairly stable recently. Has been going to weight watchers and has 25 pounds over the last 4-5 months   Hyperlipidemia- intolerant to statins- due to joint stiffness.  Tries to watch his diet.   Gout- maintained on Uloric. Has taken colchicine in the past but had pancreatitis bout and it was d/c'd.  Reports that he tries to avoid offending foods.  Gets some knee/great toe and ankle flares intermittently.    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 Followed by phillip Williford- Dermatology.   Asthma (sever persistent)- followed by Dr. Verlin Fester- Asthma/Allergy, was maintained on omalizumab but this has been discontinued. Recently started on Incruse and spiriva respimat. Continues singulair.   Allergic rhinitis- managed by allergist.   S/p radical prostatectomy 2009 hx of prostate cancer. He sees urology Q6 months. Sees Dr Rosana Hoes.  (at [facility])   AVN- s/p L THA.    OSA- reports severe OSA.  Has had it "pretty much my whole life."  Reports 40-60 events/hour. Uses CPAP faithfully.  He is followed by Dr. Queen Slough.    Hx of PE- 2016,  He completed 6 months of xarelto.  Reports hx of admission with chest pain 4/18.  Denies chest pain.   Last colo 2013- was due for follow 2018  HTN- ran out medication.     Review of Systems  Constitutional: Negative for unexpected weight change.  HENT:       Nasal congestion is stable.   Respiratory:       Reports asthma symptoms are stable.    Cardiovascular:       Has some chronic RLE swelling- has been present since prior to his PE.   Gastrointestinal: Negative for constipation and diarrhea.  Genitourinary:         Has stress incontinence since his prostatectomy  Musculoskeletal: Positive for arthralgias.  Skin:       Reports + eczema  Neurological: Negative for headaches.  Hematological: Negative for adenopathy.  Psychiatric/Behavioral:       Denies depression/anxiety symptoms   Past Medical History:  Diagnosis Date  . Allergy   . Asthma   . Cancer (HCC)    basal cell and squamous cell carcinoma  . Cancer [facility])    prostate  . Colon polyps   . GERD (gastroesophageal reflux disease)   . Gout   . High cholesterol   . History of hepatitis    due to drug interaction?  . History of pancreatitis 2016   ?due to colchicine  . History of pulmonary embolus (PE) 2016  . History of stomach ulcers   . Hypertension   . OSA (obstructive sleep apnea) 2015   has CPAP  . Thyroid disease    hypothyroid  . Urinary incontinence      Social History   Socioeconomic History  . Marital status: Married    Spouse name: Not on file  . Number of children: Not on file  . Years of education: Not on file  . Highest education level: Not on file  Social Needs  . Financial resource strain: Not on file  . Food insecurity - worry: Not on  file  . Food insecurity - inability: Not on file  . Transportation needs - medical: Not on file  . Transportation needs - non-medical: Not on file  Occupational History  . Not on file  Tobacco Use  . Smoking status: Never Smoker  . Smokeless tobacco: Never Used  Substance and Sexual Activity  . Alcohol use: Yes    Alcohol/week: 0.6 oz    Types: 1 Glasses of wine per week  . Drug use: No  . Sexual activity: Not on file  Other Topics Concern  . Not on file  Social History Narrative   Former Lawyer at [employer], now Optician, dispensing.    Married   3 daughters- all live locally   Has 7 grandchildren and 1 great grandchild   Enjoys reading, Geophysicist/field seismologist    Past Surgical History:  Procedure Laterality Date  . BASAL CELL CARCINOMA EXCISION   2017   MOHS.  beneath right eye  . CHOLECYSTECTOMY  2001  . PENILE PROSTHESIS IMPLANT  2016  . PROSTATECTOMY  2009  . SINUS SURGERY WITH INSTATRAK  2014  . SQUAMOUS CELL CARCINOMA EXCISION  01/2017   nose  . TOTAL HIP ARTHROPLASTY  06/2016    Family History  Problem Relation Age of Onset  . Asthma Mother   . Arthritis Mother   . Skin cancer Mother        basal cell carcinoma  . Asthma Father   . Arthritis Father   . Diabetes Father   . Heart attack Father   . Hyperlipidemia Father   . Hypertension Father   . Kidney disease Father   . Diabetes Brother   . Breast cancer Maternal Grandmother   . Diabetes Paternal Grandfather   . Arthritis Brother   . Hyperlipidemia Brother   . Alcohol abuse Brother     Allergies  Allergen Reactions  . Colchicine Other (See Comments)    Pancreatitis  . Moxifloxacin Palpitations  . Doxycycline Hives  . Statins Other (See Comments)    Joint stiffness  . Avelox [Moxifloxacin Hcl In Nacl] Other (See Comments)    SEIZURE  . Adhesive  [Tape] Other (See Comments)    "tears skin"  . Penicillins Rash    Current Outpatient Medications on File Prior to Visit  Medication Sig Dispense Refill  . albuterol (PROVENTIL HFA;VENTOLIN HFA) 108 (90 Base) MCG/ACT inhaler Inhale into the lungs.    . budesonide-formoterol (SYMBICORT) 160-4.5 MCG/ACT inhaler Inhale 2 puffs into the lungs 2 (two) times daily. Rinse gargle and spit after use. 1 Inhaler 5  . Cholecalciferol (VITAMIN D3) 5000 units CAPS Take by mouth.    . docusate sodium (COLACE) 100 MG capsule Take 200 mg by mouth.    . EPINEPHrine 0.3 mg/0.3 mL IJ SOAJ injection Inject 0.3 mg into the muscle.    . fexofenadine (ALLEGRA) 180 MG tablet Take 180 mg by mouth.    . Fluticasone Propionate (XHANCE) 93 MCG/ACT EXHU Place 1 spray into the nose 2 (two) times daily. 32 mL 5  . ipratropium-albuterol (DUONEB) 0.5-2.5 (3) MG/3ML SOLN 3 mLs.    Marland Kitchen levothyroxine (SYNTHROID, LEVOTHROID) 75 MCG tablet Take  75 mcg by mouth daily before breakfast.    . MAGNESIUM PO Take 500 mg by mouth.    . montelukast (SINGULAIR) 10 MG tablet Take 1 tablet (10 mg total) by mouth at bedtime. 90 tablet 3  . Multiple Vitamins-Minerals (OCUVITE EXTRA PO) Take by mouth.    . Tiotropium Bromide Monohydrate (  SPIRIVA RESPIMAT) 1.25 MCG/ACT AERS Inhale 2 puffs into the lungs daily. 3 Inhaler 3  . umeclidinium bromide (INCRUSE ELLIPTA) 62.5 MCG/INH AEPB Inhale 1 puff into the lungs daily. 30 each 5   No current facility-administered medications on file prior to visit.     BP (!) 141/98 (BP Location: Right Arm, Cuff Size: Normal)   Pulse 65   Temp 98.2 F (36.8 C) (Oral)   Resp 16   Ht 5\' 6"  (1.676 m)   Wt 209 lb 3.2 oz (94.9 kg)   SpO2 99%   BMI 33.77 kg/m       Objective:   Physical Exam  Constitutional: He is oriented to person, place, and time. He appears well-developed and well-nourished. No distress.  HENT:  Head: Normocephalic and atraumatic.  Right Ear: Tympanic membrane and ear canal normal.  Left Ear: Tympanic membrane and ear canal normal.  Mouth/Throat: No oropharyngeal exudate, posterior oropharyngeal edema or posterior oropharyngeal erythema.  Eyes: No scleral icterus.  Neck: Neck supple. No thyromegaly present.  Cardiovascular: Normal rate and regular rhythm.  No murmur heard. Pulmonary/Chest: Effort normal and breath sounds normal. No respiratory distress. He has no wheezes. He has no rales.  Abdominal: Soft. Bowel sounds are normal.  + rectus diastasis  Musculoskeletal: He exhibits no edema.  Lymphadenopathy:    He has no cervical adenopathy.  Neurological: He is alert and oriented to person, place, and time.  Skin: Skin is warm and dry.  Psychiatric: He has a normal mood and affect. His behavior is normal. Thought content normal.          Assessment & Plan:

## 2017-03-24 NOTE — Assessment & Plan Note (Signed)
Stable on uloric, continue same.

## 2017-03-24 NOTE — Assessment & Plan Note (Signed)
Reports hx of severe OSA, excellent CPAP compliance.  CPAP managed by Dr. Queen Slough.

## 2017-03-24 NOTE — Assessment & Plan Note (Signed)
Intolerant to statin. Obtain follow up lipid panel. He has lost weight so I expect it will be improved.

## 2017-03-24 NOTE — Assessment & Plan Note (Signed)
S/p radical prostatectomy 2009.  Followed by Dr. Rosana Hoes at [facility], has some stress incontinence.

## 2017-03-24 NOTE — Assessment & Plan Note (Signed)
Uncontrolled, restart losartan.

## 2017-03-25 LAB — HEPATITIS C ANTIBODY
Hepatitis C Ab: NONREACTIVE
SIGNAL TO CUT-OFF: 0.02 (ref <1.00)

## 2017-03-26 ENCOUNTER — Encounter: Payer: Self-pay | Admitting: Family

## 2017-04-06 ENCOUNTER — Other Ambulatory Visit: Payer: Self-pay

## 2017-04-06 ENCOUNTER — Encounter: Payer: Self-pay | Admitting: Allergy and Immunology

## 2017-04-06 DIAGNOSIS — J4550 Severe persistent asthma, uncomplicated: Secondary | ICD-10-CM

## 2017-04-06 MED ORDER — TIOTROPIUM BROMIDE MONOHYDRATE 1.25 MCG/ACT IN AERS: 2.0000 | INHALATION_SPRAY | Freq: Every day | RESPIRATORY_TRACT | 3 refills | Status: DC

## 2017-04-24 ENCOUNTER — Encounter: Payer: Self-pay | Admitting: Family

## 2017-04-25 ENCOUNTER — Encounter: Payer: Self-pay | Admitting: Family

## 2017-05-01 ENCOUNTER — Encounter: Payer: Self-pay | Admitting: Internal Medicine

## 2017-05-12 ENCOUNTER — Telehealth: Payer: Self-pay | Admitting: *Deleted

## 2017-05-12 NOTE — Telephone Encounter (Signed)
Sounds good

## 2017-05-12 NOTE — Telephone Encounter (Signed)
I contacted patient regarding biologic therapy for his asthma.  I have gotten patient approved through appeal for Nucala but due to his age 67 this month and able to get [facility] they will not give copay card for drug. I can get copay for Berna Bue so I called and he is okay with starting [place] therapy.  I will send Rx and be in contact with him in the next week

## 2017-05-19 ENCOUNTER — Telehealth: Payer: Self-pay | Admitting: *Deleted

## 2017-05-19 NOTE — Telephone Encounter (Signed)
Patient has some concerns about Michael [name] and would like to discuss his concerns with you. Could you please call him to discuss his concerns? Patient is aware Dr. Verlin Fester is out of office until Monday.

## 2017-05-22 NOTE — Telephone Encounter (Signed)
Yes, Xolair and Berna Bue work via completely different mechanisms. Based upon his history, he needs to take PE precautions on a 5 hour flight regardless of the medications he is taking.

## 2017-05-22 NOTE — Telephone Encounter (Signed)
Patient aware.

## 2017-05-22 NOTE — Telephone Encounter (Signed)
I spoke with Michael [name] this morning. He was concerned about the fact that while on Xolair he developed multiple Pes and had a cardiac event. He said that it could have been the 5 hour flight he was on, but was just concerned about that occurring again. He also says that because he is a cancer survivor he wanted to check and see if you had to monitor that as well. I let him know that when an occurrence happens with any medication it is listed with the side effects/possible adverse reactions. He did tell me as well that he would like to move forward with the [place], he just had some concerns. He thanked me for easing his mind.

## 2017-05-22 NOTE — Telephone Encounter (Signed)
Could you ask what his concerns are? Thanks.

## 2017-06-08 ENCOUNTER — Ambulatory Visit: Payer: Managed Care, Other (non HMO) | Admitting: Allergy and Immunology

## 2017-06-08 ENCOUNTER — Encounter: Payer: Self-pay | Admitting: Allergy and Immunology

## 2017-06-08 VITALS — BP 110/80 | HR 62 | Temp 97.9°F | Resp 16

## 2017-06-08 DIAGNOSIS — J019 Acute sinusitis, unspecified: Secondary | ICD-10-CM | POA: Insufficient documentation

## 2017-06-08 DIAGNOSIS — J45901 Unspecified asthma with (acute) exacerbation: Secondary | ICD-10-CM

## 2017-06-08 DIAGNOSIS — J3089 Other allergic rhinitis: Secondary | ICD-10-CM

## 2017-06-08 DIAGNOSIS — Z8709 Personal history of other diseases of the respiratory system: Secondary | ICD-10-CM | POA: Diagnosis not present

## 2017-06-08 DIAGNOSIS — J0100 Acute maxillary sinusitis, unspecified: Secondary | ICD-10-CM

## 2017-06-08 MED ORDER — PREDNISONE 1 MG PO TABS: 10.0000 mg | ORAL_TABLET | Freq: Every day | ORAL | Status: AC

## 2017-06-08 MED ORDER — METHYLPREDNISOLONE ACETATE 80 MG/ML IJ SUSP
80.0000 mg | Freq: Once | INTRAMUSCULAR | Status: AC
  Administered 2017-06-08: 80 mg via INTRAMUSCULAR

## 2017-06-08 MED ORDER — FLUTICASONE PROPIONATE HFA 110 MCG/ACT IN AERO: 2.0000 | INHALATION_SPRAY | Freq: Two times a day (BID) | RESPIRATORY_TRACT | 5 refills | Status: DC

## 2017-06-08 NOTE — Assessment & Plan Note (Addendum)
   Depo-Medrol and prednisone have been provided (as above).  Continue Nasacort 2 sprays per nostril twice daily.  Nasal saline lavage (NeilMed) has been recommended as needed and prior to medicated nasal sprays along with instructions for proper administration.  For thick post nasal drainage, add guaifenesin [phone] mg (Mucinex)  twice daily as needed with adequate hydration as discussed.

## 2017-06-08 NOTE — Assessment & Plan Note (Addendum)
   Depo-Medrol 80 mg was administered in the office.  Prednisone has been provided and is to be started tomorrow as follows: 20 mg daily x 4 days, 10 mg x1 day, then stop.  Continue Symbicort 160-4.5 g, 2 inhalations via spacer device twice daily, Spiriva Respimat 1.25 mg, 2 inhalations daily, montelukast 10 mg daily bedtime, and DuoNeb every 6-8 hours if needed.   It is unclear how Incruse Ellipta ended up on his medication list, however this medication is to be discontinued.  For now, and during respiratory tract infections or asthma flares, add Flovent 110g 2 inhalations via spacer device 2 times per day until symptoms have returned to baseline.  Initiate benralizumab (Fasenra) injections once this exacerbation has resolved.  The patient has been asked to contact me if his symptoms persist or progress. Otherwise, he may return for follow up in 4 months.  Continue avoidance of aspirin/NSAIDs.

## 2017-06-08 NOTE — Assessment & Plan Note (Addendum)
   Continue Nasacort AQ (as above).

## 2017-06-08 NOTE — Progress Notes (Signed)
Follow-up Note  RE: Michael [name] MRN: [id number] DOB: 16-Apr-1951 Date of Office Visit: 06/08/2017  Primary care provider: Debbrah Alar, NP Referring provider: Debbrah Alar, NP  History of present illness: Michael [name] is a 67 y.o. male with a complex medical history including severe persistent asthma, chronic sinusitis, and history of nasal polyposis presenting today for sick visit.  He was previously seen in this clinic for his initial evaluation on February 09, 2017.  Approximately 3 weeks ago he is exposed to his grandchild who had symptoms consistent with an upper respiratory tract infection.  The patient began to experience nasal congestion, sinus pressure, thick postnasal drainage, and fever.  He went to an outpatient clinic and was treated with azithromycin.  The fever resolved and his sinus symptoms improved moderately, however over the past week his asthma has been "wildly out of control."  He is requiring DuoNeb multiple times per day.  He reports that he currently takes Symbicort 160-4.5 g, 2 inhalations via spacer device twice daily, Spiriva Respimat 1.25 mg 2 inhalations, montelukast 10 mg daily, and Incruse Ellipta 1 inhalation daily.  Incruse Ellipta was not prescribed during his previous visit in this office and he is uncertain why he is taking this medication.  He is scheduled for his first benralizumab Berna Bue) injection tomorrow.   Assessment and plan: Asthma with acute exacerbation  Depo-Medrol 80 mg was administered in the office.  Prednisone has been provided and is to be started tomorrow as follows: 20 mg daily x 4 days, 10 mg x1 day, then stop.  Continue Symbicort 160-4.5 g, 2 inhalations via spacer device twice daily, Spiriva Respimat 1.25 mg, 2 inhalations daily, montelukast 10 mg daily bedtime, and DuoNeb every 6-8 hours if needed.   It is unclear how Incruse Ellipta ended up on his medication list, however this medication is to  be discontinued.  For now, and during respiratory tract infections or asthma flares, add Flovent 110g 2 inhalations via spacer device 2 times per day until symptoms have returned to baseline.  Initiate benralizumab (Fasenra) injections once this exacerbation has resolved.  The patient has been asked to contact me if his symptoms persist or progress. Otherwise, he may return for follow up in 4 months.  Continue avoidance of aspirin/NSAIDs.  Acute sinusitis  Depo-Medrol and prednisone have been provided (as above).  Continue Nasacort 2 sprays per nostril twice daily.  Nasal saline lavage (NeilMed) has been recommended as needed and prior to medicated nasal sprays along with instructions for proper administration.  For thick post nasal drainage, add guaifenesin [phone] mg (Mucinex)  twice daily as needed with adequate hydration as discussed.  History of nasal polyp  Continue Nasacort AQ (as above).   Meds ordered this encounter  Medications  . fluticasone (FLOVENT HFA) 110 MCG/ACT inhaler    Sig: Inhale 2 puffs into the lungs 2 (two) times daily.    Dispense:  1 Inhaler    Refill:  5  . methylPREDNISolone acetate (DEPO-MEDROL) injection 80 mg  . predniSONE (DELTASONE) tablet 10 mg    Diagnostics: Spirometry reveals an FVC of 3.48 L and an FEV1 of 2.40 L (81% predicted) without significant postbronchodilator improvement.  Please see scanned spirometry results for details.    Physical examination: Blood pressure 110/80, pulse 62, temperature 97.9 F (36.6 C), temperature source Oral, resp. rate 16, SpO2 96 %.  General: Alert, interactive, in no acute distress. HEENT: TMs pearly gray, turbinates moderately edematous with thick discharge, post-pharynx moderately  erythematous. Neck: Supple without lymphadenopathy. Lungs: Decreased breath sounds bilaterally without wheezing, rhonchi or rales. CV: Normal S1, S2 without murmurs. Skin: Warm and dry, without lesions or  rashes.  The following portions of the patient's history were reviewed and updated as appropriate: allergies, current medications, past family history, past medical history, past social history, past surgical history and problem list.  Allergies as of 06/08/2017      Reactions   Colchicine Other (See Comments)   Pancreatitis   Moxifloxacin Palpitations   Doxycycline Hives   Statins Other (See Comments)   Joint stiffness   Avelox [moxifloxacin Hcl In Nacl] Other (See Comments)   SEIZURE   Adhesive  [tape] Other (See Comments)   "tears skin"   Penicillins Rash      Medication List        Accurate as of 06/08/17  2:13 PM. Always use your most recent med list.          albuterol 108 (90 Base) MCG/ACT inhaler Commonly known as:  PROVENTIL HFA;VENTOLIN HFA Inhale into the lungs.   budesonide-formoterol 160-4.5 MCG/ACT inhaler Commonly known as:  SYMBICORT Inhale 2 puffs into the lungs 2 (two) times daily. Rinse gargle and spit after use.   docusate sodium 100 MG capsule Commonly known as:  COLACE Take 200 mg by mouth.   EPINEPHrine 0.3 mg/0.3 mL Soaj injection Commonly known as:  EPI-PEN Inject 0.3 mg into the muscle.   febuxostat 40 MG tablet Commonly known as:  ULORIC Take 1 tablet (40 mg total) by mouth daily.   fexofenadine 180 MG tablet Commonly known as:  ALLEGRA Take 180 mg by mouth.   fluticasone 110 MCG/ACT inhaler Commonly known as:  FLOVENT HFA Inhale 2 puffs into the lungs 2 (two) times daily.   Fluticasone Propionate 93 MCG/ACT Exhu Commonly known as:  XHANCE Place 1 spray into the nose 2 (two) times daily.   ipratropium-albuterol 0.5-2.5 (3) MG/3ML Soln Commonly known as:  DUONEB 3 mLs.   levothyroxine 75 MCG tablet Commonly known as:  SYNTHROID, LEVOTHROID Take 75 mcg by mouth daily before breakfast.   losartan 25 MG tablet Commonly known as:  COZAAR Take 1 tablet (25 mg total) by mouth daily.   MAGNESIUM PO Take 500 mg by mouth.    montelukast 10 MG tablet Commonly known as:  SINGULAIR Take 1 tablet (10 mg total) by mouth at bedtime.   OCUVITE EXTRA PO Take by mouth.   Tiotropium Bromide Monohydrate 1.25 MCG/ACT Aers Commonly known as:  SPIRIVA RESPIMAT Inhale 2 puffs into the lungs daily.   umeclidinium bromide 62.5 MCG/INH Aepb Commonly known as:  INCRUSE ELLIPTA Inhale 1 puff into the lungs daily.   Vitamin D3 5000 units Caps Take by mouth.       Allergies  Allergen Reactions  . Colchicine Other (See Comments)    Pancreatitis  . Moxifloxacin Palpitations  . Doxycycline Hives  . Statins Other (See Comments)    Joint stiffness  . Avelox [Moxifloxacin Hcl In Nacl] Other (See Comments)    SEIZURE  . Adhesive  [Tape] Other (See Comments)    "tears skin"  . Penicillins Rash   Review of systems: Review of systems negative except as noted in HPI / PMHx or noted below: Constitutional: Negative.  HENT: Negative.   Eyes: Negative.  Respiratory: Negative.   Cardiovascular: Negative.  Gastrointestinal: Negative.  Genitourinary: Negative.  Musculoskeletal: Negative.  Neurological: Negative.  Endo/Heme/Allergies: Negative.  Cutaneous: Negative.  Past Medical History:  Diagnosis Date  .  Allergy   . Asthma   . Cancer (HCC)    basal cell and squamous cell carcinoma  . Cancer [facility])    prostate  . Colon polyps   . GERD (gastroesophageal reflux disease)   . Gout   . High cholesterol   . History of hepatitis    due to drug interaction?  . History of pancreatitis 2016   ?due to colchicine  . History of pulmonary embolus (PE) 2016  . History of stomach ulcers   . Hypertension   . OSA (obstructive sleep apnea) 2015   has CPAP  . Thyroid disease    hypothyroid  . Urinary incontinence     Family History  Problem Relation Age of Onset  . Asthma Mother   . Arthritis Mother   . Skin cancer Mother        basal cell carcinoma  . Asthma Father   . Arthritis Father   . Diabetes Father   .  Heart attack Father   . Hyperlipidemia Father   . Hypertension Father   . Kidney disease Father   . Diabetes Brother   . Breast cancer Maternal Grandmother   . Diabetes Paternal Grandfather   . Arthritis Brother   . Hyperlipidemia Brother   . Alcohol abuse Brother     Social History   Socioeconomic History  . Marital status: Married    Spouse name: Not on file  . Number of children: Not on file  . Years of education: Not on file  . Highest education level: Not on file  Social Needs  . Financial resource strain: Not on file  . Food insecurity - worry: Not on file  . Food insecurity - inability: Not on file  . Transportation needs - medical: Not on file  . Transportation needs - non-medical: Not on file  Occupational History  . Not on file  Tobacco Use  . Smoking status: Never Smoker  . Smokeless tobacco: Never Used  Substance and Sexual Activity  . Alcohol use: Yes    Alcohol/week: 0.6 oz    Types: 1 Glasses of wine per week  . Drug use: No  . Sexual activity: Not on file  Other Topics Concern  . Not on file  Social History Narrative   Former Lawyer at [employer], now Optician, dispensing.    Married   3 daughters- all live locally   Has 7 grandchildren and 1 great grandchild   Enjoys reading, Geophysicist/field seismologist    I appreciate the opportunity to take part in [place] care. Please do not hesitate to contact me with questions.  Sincerely,   R. Edgar Frisk, MD

## 2017-06-08 NOTE — Patient Instructions (Addendum)
Asthma with acute exacerbation  Depo-Medrol 80 mg was administered in the office.  Prednisone has been provided and is to be started tomorrow as follows: 20 mg daily x 4 days, 10 mg x1 day, then stop.  Continue Symbicort 160-4.5 g, 2 inhalations via spacer device twice daily, Spiriva Respimat 1.25 mg, 2 inhalations daily, montelukast 10 mg daily bedtime, and DuoNeb every 6-8 hours if needed.   It is unclear how Incruse Ellipta ended up on his medication list, however this medication is to be discontinued.  For now, and during respiratory tract infections or asthma flares, add Flovent 110g 2 inhalations via spacer device 2 times per day until symptoms have returned to baseline.  Initiate benralizumab (Fasenra) injections once this exacerbation has resolved.  The patient has been asked to contact me if his symptoms persist or progress. Otherwise, he may return for follow up in 4 months.  Continue avoidance of aspirin/NSAIDs.  Acute sinusitis  Depo-Medrol and prednisone have been provided (as above).  Continue Nasacort 2 sprays per nostril twice daily.  Nasal saline lavage (NeilMed) has been recommended as needed and prior to medicated nasal sprays along with instructions for proper administration.  For thick post nasal drainage, add guaifenesin [phone] mg (Mucinex)  twice daily as needed with adequate hydration as discussed.  History of nasal polyp  Continue Nasacort AQ (as above).   Return in about 4 months (around 10/06/2017), or if symptoms worsen or fail to improve.

## 2017-06-09 ENCOUNTER — Ambulatory Visit: Payer: Self-pay

## 2017-06-13 ENCOUNTER — Telehealth: Payer: Self-pay

## 2017-06-13 ENCOUNTER — Encounter: Payer: Self-pay | Admitting: *Deleted

## 2017-06-13 NOTE — Telephone Encounter (Signed)
Ok. What dose of flovent did we prescribe?

## 2017-06-13 NOTE — Telephone Encounter (Signed)
PA for flovent was denied because pt has not tried an failed qvar. Please advise

## 2017-06-13 NOTE — Telephone Encounter (Signed)
flovent  2 puffs bid

## 2017-06-13 NOTE — Telephone Encounter (Signed)
Please advise 

## 2017-06-13 NOTE — Telephone Encounter (Signed)
How many micrograms? 44? 110? 220?

## 2017-06-15 ENCOUNTER — Other Ambulatory Visit: Payer: Self-pay

## 2017-06-15 MED ORDER — BECLOMETHASONE DIPROPIONATE 80 MCG/ACT IN AERS: 2.0000 | INHALATION_SPRAY | Freq: Two times a day (BID) | RESPIRATORY_TRACT | 5 refills | Status: DC

## 2017-06-15 NOTE — Telephone Encounter (Signed)
110 

## 2017-06-15 NOTE — Telephone Encounter (Signed)
Qvar 80 g Respiclick, 2 inhalations twice daily.  Thanks.

## 2017-06-15 NOTE — Telephone Encounter (Signed)
Sent in new rx 

## 2017-06-16 ENCOUNTER — Ambulatory Visit (INDEPENDENT_AMBULATORY_CARE_PROVIDER_SITE_OTHER): Payer: Managed Care, Other (non HMO) | Admitting: *Deleted

## 2017-06-16 DIAGNOSIS — J4550 Severe persistent asthma, uncomplicated: Secondary | ICD-10-CM | POA: Diagnosis not present

## 2017-06-16 DIAGNOSIS — L509 Urticaria, unspecified: Secondary | ICD-10-CM

## 2017-06-16 MED ORDER — EPINEPHRINE 0.3 MG/0.3ML IJ SOAJ: INTRAMUSCULAR | 1 refills | Status: DC

## 2017-06-16 MED ORDER — BENRALIZUMAB 30 MG/ML ~~LOC~~ SOSY
30.0000 mg | PREFILLED_SYRINGE | Freq: Once | SUBCUTANEOUS | Status: AC
  Administered 2017-06-16: 30 mg via SUBCUTANEOUS

## 2017-06-16 NOTE — Progress Notes (Signed)
Immunotherapy   Patient Details  Name: Michael [name] MRN: [id number] Date of Birth: March 31, 1951  06/16/2017  Michael [name] started injections for  Fasenra 30mg  once every 4 weeks for 3 doses then thereafter every 8 weeks Frequency: Every 4 weeks Epi-Pen:Epi-Pen Available  Consent signed and patient instructions given. Injection given in left arm.   Michael [name] 06/16/2017, 9:33 AM

## 2017-06-19 ENCOUNTER — Ambulatory Visit: Payer: Self-pay | Admitting: Allergy and Immunology

## 2017-06-23 ENCOUNTER — Encounter: Payer: Self-pay | Admitting: Family

## 2017-06-23 ENCOUNTER — Ambulatory Visit (INDEPENDENT_AMBULATORY_CARE_PROVIDER_SITE_OTHER): Payer: Managed Care, Other (non HMO) | Admitting: Family

## 2017-06-23 VITALS — BP 139/78 | HR 60 | Temp 98.2°F | Resp 16 | Ht 66.5 in | Wt 215.0 lb

## 2017-06-23 DIAGNOSIS — Z0000 Encounter for general adult medical examination without abnormal findings: Secondary | ICD-10-CM | POA: Diagnosis not present

## 2017-06-23 DIAGNOSIS — M109 Gout, unspecified: Secondary | ICD-10-CM | POA: Diagnosis not present

## 2017-06-23 DIAGNOSIS — N281 Cyst of kidney, acquired: Secondary | ICD-10-CM

## 2017-06-23 LAB — URINALYSIS, ROUTINE W REFLEX MICROSCOPIC
BILIRUBIN URINE: NEGATIVE
Hgb urine dipstick: NEGATIVE
Ketones, ur: NEGATIVE
LEUKOCYTES UA: NEGATIVE
NITRITE: NEGATIVE
Specific Gravity, Urine: 1.01 (ref 1.000–1.030)
Total Protein, Urine: NEGATIVE
UROBILINOGEN UA: 0.2 (ref 0.0–1.0)
Urine Glucose: NEGATIVE
WBC UA: NONE SEEN — AB (ref 0–?)
pH: 7 (ref 5.0–8.0)

## 2017-06-23 LAB — URIC ACID: URIC ACID, SERUM: 3.8 mg/dL — AB (ref 4.0–7.8)

## 2017-06-23 LAB — CBC WITH DIFFERENTIAL/PLATELET
Basophils Absolute: 0 10*3/uL (ref 0.0–0.1)
Basophils Relative: 0.3 % (ref 0.0–3.0)
EOS ABS: 0 10*3/uL (ref 0.0–0.7)
Eosinophils Relative: 0 % (ref 0.0–5.0)
HEMATOCRIT: 47 % (ref 39.0–52.0)
HEMOGLOBIN: 16.1 g/dL (ref 13.0–17.0)
LYMPHS PCT: 23.3 % (ref 12.0–46.0)
Lymphs Abs: 1.3 10*3/uL (ref 0.7–4.0)
MCHC: 34.2 g/dL (ref 30.0–36.0)
MCV: 92.7 fl (ref 78.0–100.0)
Monocytes Absolute: 0.7 10*3/uL (ref 0.1–1.0)
Monocytes Relative: 13 % — ABNORMAL HIGH (ref 3.0–12.0)
Neutro Abs: 3.6 10*3/uL (ref 1.4–7.7)
Neutrophils Relative %: 63.4 % (ref 43.0–77.0)
Platelets: 271 10*3/uL (ref 150.0–400.0)
RBC: 5.07 Mil/uL (ref 4.22–5.81)
RDW: 12.9 % (ref 11.5–15.5)
WBC: 5.7 10*3/uL (ref 4.0–10.5)

## 2017-06-23 MED ORDER — CEFDINIR 300 MG PO CAPS: 300.0000 mg | ORAL_CAPSULE | Freq: Two times a day (BID) | ORAL | 0 refills | Status: DC

## 2017-06-23 MED ORDER — ZOSTER VAC RECOMB ADJUVANTED 50 MCG/0.5ML IM SUSR: INTRAMUSCULAR | 1 refills | Status: DC

## 2017-06-23 NOTE — Progress Notes (Signed)
Subjective:    Patient ID: Michael [name], male    DOB: March 06, 1951, 67 y.o.   MRN: [id number]  HPI  Patient presents today for complete physical.  Immunizations:  Due for shingrix Diet:  Goes to weight watchers Wt Readings from Last 3 Encounters:  06/23/17 215 lb (97.5 kg)  03/24/17 209 lb 3.2 oz (94.9 kg)  02/09/17 199 lb 4.7 oz (90.4 kg)  Exercise: walks on a treadmill 30 minutes 3-4 times a week Colonoscopy: 2013- scheduled for 3/18  Reports that 2/18 he had [place] and he was told that he has a kidney cyst.   Completed zpak 2 week ago. Feels like he still has a left sided sinus infection.    Reports some "gout" in his left foot and right achilles tendon pain.  Reports that he took meloxicam and ibuprofen   Review of Systems  Constitutional: Positive for unexpected weight change.  HENT: Negative for rhinorrhea.   Respiratory: Positive for cough.        Attributes cough to asthma  Cardiovascular: Positive for leg swelling.       Wears compression socks when he flies  Gastrointestinal: Negative for constipation and diarrhea.  Genitourinary: Negative for dysuria and frequency.       Has stress incontinence  Musculoskeletal: Positive for arthralgias.  Skin: Negative for rash.  Neurological: Negative for headaches.  Hematological: Negative for adenopathy.  Psychiatric/Behavioral:       Denies depression/anxiety   Past Medical History:  Diagnosis Date  . Allergy   . Asthma   . Cancer (HCC)    basal cell and squamous cell carcinoma  . Cancer [facility])    prostate  . Colon polyps   . GERD (gastroesophageal reflux disease)   . Gout   . High cholesterol   . History of hepatitis    due to drug interaction?  . History of pancreatitis 2016   ?due to colchicine  . History of pulmonary embolus (PE) 2016  . History of stomach ulcers   . Hypertension   . OSA (obstructive sleep apnea) 2015   has CPAP  . Thyroid disease    hypothyroid  . Urinary incontinence        Social History   Socioeconomic History  . Marital status: Married    Spouse name: Not on file  . Number of children: Not on file  . Years of education: Not on file  . Highest education level: Not on file  Social Needs  . Financial resource strain: Not on file  . Food insecurity - worry: Not on file  . Food insecurity - inability: Not on file  . Transportation needs - medical: Not on file  . Transportation needs - non-medical: Not on file  Occupational History  . Not on file  Tobacco Use  . Smoking status: Never Smoker  . Smokeless tobacco: Never Used  Substance and Sexual Activity  . Alcohol use: Yes    Alcohol/week: 0.6 oz    Types: 1 Glasses of wine per week  . Drug use: No  . Sexual activity: Not on file  Other Topics Concern  . Not on file  Social History Narrative   Former Lawyer at [employer], now Optician, dispensing.    Married   3 daughters- all live locally   Has 7 grandchildren and 1 great grandchild   Enjoys reading, Geophysicist/field seismologist    Past Surgical History:  Procedure Laterality Date  . BASAL CELL CARCINOMA EXCISION  2017  MOHS.  beneath right eye  . CHOLECYSTECTOMY  2001  . PENILE PROSTHESIS IMPLANT  2016  . PROSTATECTOMY  2009  . SINUS SURGERY WITH INSTATRAK  2014  . SQUAMOUS CELL CARCINOMA EXCISION  01/2017   nose  . TOTAL HIP ARTHROPLASTY  06/2016    Family History  Problem Relation Age of Onset  . Asthma Mother   . Arthritis Mother   . Skin cancer Mother        basal cell carcinoma  . Asthma Father   . Arthritis Father   . Diabetes Father   . Heart attack Father   . Hyperlipidemia Father   . Hypertension Father   . Kidney disease Father   . Diabetes Brother   . Breast cancer Maternal Grandmother   . Diabetes Paternal Grandfather   . Arthritis Brother   . Hyperlipidemia Brother   . Alcohol abuse Brother     Allergies  Allergen Reactions  . Colchicine Other (See Comments)    Pancreatitis  . Moxifloxacin  Palpitations  . Doxycycline Hives  . Statins Other (See Comments)    Joint stiffness  . Avelox [Moxifloxacin Hcl In Nacl] Other (See Comments)    SEIZURE  . Adhesive  [Tape] Other (See Comments)    "tears skin"  . Penicillins Rash    Current Outpatient Medications on File Prior to Visit  Medication Sig Dispense Refill  . albuterol (PROVENTIL HFA;VENTOLIN HFA) 108 (90 Base) MCG/ACT inhaler Inhale into the lungs.    . beclomethasone (QVAR) 80 MCG/ACT inhaler Inhale 2 puffs into the lungs 2 (two) times daily. 8.7 g 5  . budesonide-formoterol (SYMBICORT) 160-4.5 MCG/ACT inhaler Inhale 2 puffs into the lungs 2 (two) times daily. Rinse gargle and spit after use. 1 Inhaler 5  . Cholecalciferol (VITAMIN D3) 5000 units CAPS Take by mouth.    . docusate sodium (COLACE) 100 MG capsule Take 200 mg by mouth.    . EPINEPHrine 0.3 mg/0.3 mL IJ SOAJ injection Use as directed for severe allergic reactions 2 Device 1  . febuxostat (ULORIC) 40 MG tablet Take 1 tablet (40 mg total) by mouth daily. 90 tablet 1  . fexofenadine (ALLEGRA) 180 MG tablet Take 180 mg by mouth.    . fluticasone (FLOVENT HFA) 110 MCG/ACT inhaler Inhale 2 puffs into the lungs 2 (two) times daily. 1 Inhaler 5  . Fluticasone Propionate (XHANCE) 93 MCG/ACT EXHU Place 1 spray into the nose 2 (two) times daily. 32 mL 5  . ipratropium-albuterol (DUONEB) 0.5-2.5 (3) MG/3ML SOLN 3 mLs.    Marland Kitchen levothyroxine (SYNTHROID, LEVOTHROID) 75 MCG tablet Take 75 mcg by mouth daily before breakfast.    . losartan (COZAAR) 25 MG tablet Take 1 tablet (25 mg total) by mouth daily. 90 tablet 1  . MAGNESIUM PO Take 500 mg by mouth.    . montelukast (SINGULAIR) 10 MG tablet Take 1 tablet (10 mg total) by mouth at bedtime. 90 tablet 3  . Multiple Vitamins-Minerals (OCUVITE EXTRA PO) Take by mouth.    . Tiotropium Bromide Monohydrate (SPIRIVA RESPIMAT) 1.25 MCG/ACT AERS Inhale 2 puffs into the lungs daily. 3 Inhaler 3  . umeclidinium bromide (INCRUSE ELLIPTA)  62.5 MCG/INH AEPB Inhale 1 puff into the lungs daily. 30 each 5   No current facility-administered medications on file prior to visit.     BP 139/78 (BP Location: Left Arm, Patient Position: Sitting, Cuff Size: Large)   Pulse 60   Temp 98.2 F (36.8 C) (Oral)   Resp 16  Ht 5' 6.5" (1.689 m)   Wt 215 lb (97.5 kg)   SpO2 98%   BMI 34.18 kg/m       Objective:   Physical Exam Physical Exam  Constitutional: Obese white male. He is oriented to person, place, and time. He appears well-developed and well-nourished. No distress.  HENT:  Head: Normocephalic and atraumatic.  Right Ear: Tympanic membrane and ear canal normal.  Left Ear: Tympanic membrane and ear canal normal.  Mouth/Throat: Oropharynx is clear and moist.  Eyes: Pupils are equal, round, and reactive to light. No scleral icterus.  Neck: Normal range of motion. No thyromegaly present.  Cardiovascular: Normal rate and regular rhythm.   No murmur heard. Pulmonary/Chest: Effort normal and breath sounds normal. No respiratory distress. He has no wheezes. He has no rales. He exhibits no tenderness.  Abdominal: Soft. Bowel sounds are normal. He exhibits no distension and no mass. There is no tenderness. There is no rebound and no guarding.  Musculoskeletal: He exhibits no edema.  Lymphadenopathy:    He has no cervical adenopathy.  Neurological: He is alert and oriented to person, place, and time. He has normal patellar reflexes. He exhibits normal muscle tone. Coordination normal.  Skin: Skin is warm and dry.  Psychiatric: He has a normal mood and affect. His behavior is normal. Judgment and thought content normal.           Assessment & Plan:    Preventative care- Due for shingrix, rx sent to pharmacy. Discussed healthy diet, exercise weight loss.  Obtain routine lab work.   Sinusitis- Rx with omnicef.   Gout- continue uloric obtain uric acid.     Assessment & Plan:

## 2017-06-23 NOTE — Patient Instructions (Addendum)
Add tylenol 1000mg  twice daily.  Continue to work on [employer], exercise and weight loss.

## 2017-06-25 ENCOUNTER — Telehealth: Payer: Self-pay | Admitting: Family

## 2017-06-25 ENCOUNTER — Ambulatory Visit (HOSPITAL_BASED_OUTPATIENT_CLINIC_OR_DEPARTMENT_OTHER)
Admission: RE | Admit: 2017-06-25 | Discharge: 2017-06-25 | Disposition: A | Discharge status: Home / Self Care | Payer: Managed Care, Other (non HMO) | Source: Ambulatory Visit | Attending: Family | Admitting: Family

## 2017-06-25 DIAGNOSIS — N281 Cyst of kidney, acquired: Secondary | ICD-10-CM | POA: Insufficient documentation

## 2017-06-25 NOTE — Telephone Encounter (Signed)
Could you please ask the lab to add on PSA, dx Z0.00?

## 2017-06-26 ENCOUNTER — Encounter: Payer: Self-pay | Admitting: Family

## 2017-06-26 MED ORDER — LEVOTHYROXINE SODIUM 75 MCG PO TABS: 75.0000 ug | ORAL_TABLET | Freq: Every day | ORAL | 5 refills | Status: DC

## 2017-06-26 NOTE — Telephone Encounter (Signed)
Lab is unable to add test. Sent mychart message to pt to schedule lab appt.

## 2017-06-30 ENCOUNTER — Ambulatory Visit (AMBULATORY_SURGERY_CENTER): Payer: Self-pay

## 2017-06-30 ENCOUNTER — Other Ambulatory Visit: Payer: Self-pay

## 2017-06-30 VITALS — Ht 66.5 in | Wt 215.6 lb

## 2017-06-30 DIAGNOSIS — Z86010 Personal history of colonic polyps: Secondary | ICD-10-CM

## 2017-06-30 NOTE — Progress Notes (Signed)
Denies allergies to eggs or soy products. Denies complication of anesthesia or sedation. Denies use of weight loss medication. Denies use of O2.   Emmi instructions declined.  

## 2017-07-10 ENCOUNTER — Ambulatory Visit (AMBULATORY_SURGERY_CENTER): Payer: Managed Care, Other (non HMO) | Admitting: Internal Medicine

## 2017-07-10 ENCOUNTER — Encounter: Payer: Self-pay | Admitting: Internal Medicine

## 2017-07-10 VITALS — BP 122/91 | HR 66 | Temp 97.7°F | Resp 16 | Ht 66.0 in | Wt 215.0 lb

## 2017-07-10 DIAGNOSIS — Z86010 Personal history of colonic polyps: Secondary | ICD-10-CM | POA: Diagnosis present

## 2017-07-10 DIAGNOSIS — D123 Benign neoplasm of transverse colon: Secondary | ICD-10-CM | POA: Diagnosis not present

## 2017-07-10 DIAGNOSIS — D120 Benign neoplasm of cecum: Secondary | ICD-10-CM

## 2017-07-10 MED ORDER — SODIUM CHLORIDE 0.9 % IV SOLN: 500.0000 mL | Freq: Once | INTRAVENOUS | Status: DC

## 2017-07-10 NOTE — Progress Notes (Signed)
Called to room to assist during endoscopic procedure.  Patient ID and intended procedure confirmed with present staff. Received instructions for my participation in the procedure from the performing physician.  

## 2017-07-10 NOTE — Op Note (Signed)
[place] Patient Name: Michael [name] Procedure Date: 07/10/2017 7:53 AM MRN: [id number] Endoscopist: Gatha Mayer , MD Age: 67 Referring MD:  Date of Birth: [birth year] Gender: Male Account #: [id number] Procedure:                Colonoscopy Indications:              Surveillance: Personal history of adenomatous                            polyps on last colonoscopy > 5 years ago Medicines:                Propofol per Anesthesia, Monitored Anesthesia Care Procedure:                Pre-Anesthesia Assessment:                           - Prior to the procedure, a History and Physical                            was performed, and patient medications and                            allergies were reviewed. The patient's tolerance of                            previous anesthesia was also reviewed. The risks                            and benefits of the procedure and the sedation                            options and risks were discussed with the patient.                            All questions were answered, and informed consent                            was obtained. Prior Anticoagulants: The patient has                            taken no previous anticoagulant or antiplatelet                            agents. ASA Grade Assessment: II - A patient with                            mild systemic disease. After reviewing the risks                            and benefits, the patient was deemed in                            satisfactory condition to undergo the procedure.  After obtaining informed consent, the colonoscope                            was passed under direct vision. Throughout the                            procedure, the patient's blood pressure, pulse, and                            oxygen saturations were monitored continuously. The                            Colonoscope was introduced through the anus and   advanced to the the cecum, identified by                            appendiceal orifice and ileocecal valve. The                            colonoscopy was performed without difficulty. The                            patient tolerated the procedure well. The quality                            of the bowel preparation was good. The ileocecal                            valve, appendiceal orifice, and rectum were                            photographed. Scope In: 8:10:57 AM Scope Out: 8:33:14 AM Scope Withdrawal Time: 0 hours 17 minutes 44 seconds  Total Procedure Duration: 0 hours 22 minutes 17 seconds  Findings:                 The perianal examination was normal.                           The digital rectal exam findings include surgically                            absent prostate.                           Two sessile polyps were found in the transverse                            colon. The polyps were diminutive in size. These                            polyps were removed with a cold snare. Resection                            and retrieval were complete. Verification of  patient identification for the specimen was done.                            Estimated blood loss was minimal.                           A 1 to 2 mm polyp was found in the cecum. The polyp                            was sessile. The polyp was removed with a cold                            biopsy forceps. Resection and retrieval were                            complete. Verification of patient identification                            for the specimen was done. Estimated blood loss was                            minimal.                           Diverticula were found in the sigmoid colon.                           The exam was otherwise without abnormality on                            direct and retroflexion views. Complications:            No immediate complications. Estimated Blood  Loss:     Estimated blood loss was minimal. 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                           - Two diminutive polyps in the transverse colon,                            removed with a cold snare. Resected and retrieved.                           - One 1 to 2 mm polyp in the cecum, removed with a                            cold biopsy forceps. Resected and retrieved.                           - Diverticulosis in the sigmoid colon.                           -  The examination was otherwise normal on direct                            and retroflexion views. Recommendation:           - Patient has a contact number available for                            emergencies. The signs and symptoms of potential                            delayed complications were discussed with the                            patient. Return to normal activities tomorrow.                            Written discharge instructions were provided to the                            patient.                           - Resume previous diet.                           - Continue present medications.                           - Repeat colonoscopy is recommended for                            surveillance. The colonoscopy date will be                            determined after pathology results from today's                            exam become available for review. Gatha Mayer, MD 07/10/2017 8:45:35 AM This report has been signed electronically.

## 2017-07-10 NOTE — Patient Instructions (Addendum)
I found and removed 3 tiny polyp - do not suggest cancer. I will let you know pathology results and when to have another routine colonoscopy by mail and/or My Chart.  You also have diverticulosis - thickened muscle rings and pouches in the colon wall. Please read the handout about this condition.  I appreciate the opportunity to care for you. Gatha Mayer, MD, FACG  YOU HAD AN ENDOSCOPIC PROCEDURE TODAY AT [facility]:   Refer to the procedure report that was given to you for any specific questions about what was found during the examination.  If the procedure report does not answer your questions, please call your gastroenterologist to clarify.  If you requested that your care partner not be given the details of your procedure findings, then the procedure report has been included in a sealed envelope for you to review at your convenience later.  YOU SHOULD EXPECT: Some feelings of bloating in the abdomen. Passage of more gas than usual.  Walking can help get rid of the air that was put into your GI tract during the procedure and reduce the bloating. If you had a lower endoscopy (such as a colonoscopy or flexible sigmoidoscopy) you may notice spotting of blood in your stool or on the toilet paper. If you underwent a bowel prep for your procedure, you may not have a normal bowel movement for a few days.  Please Note:  You might notice some irritation and congestion in your nose or some drainage.  This is from the oxygen used during your procedure.  There is no need for concern and it should clear up in a day or so.  SYMPTOMS TO REPORT IMMEDIATELY:   Following lower endoscopy (colonoscopy or flexible sigmoidoscopy):  Excessive amounts of blood in the stool  Significant tenderness or worsening of abdominal pains  Swelling of the abdomen that is new, acute  Fever of 100F or higher For urgent or emergent issues, a gastroenterologist can be reached at any hour by calling  [phone].   DIET:  We do recommend a small meal at first, but then you may proceed to your regular diet.  Drink plenty of fluids but you should avoid alcoholic beverages for 24 hours.  ACTIVITY:  You should plan to take it easy for the rest of today and you should NOT DRIVE or use heavy machinery until tomorrow (because of the sedation medicines used during the test).    FOLLOW UP: Our staff will call the number listed on your records the next business day following your procedure to check on you and address any questions or concerns that you may have regarding the information given to you following your procedure. If we do not reach you, we will leave a message.  However, if you are feeling well and you are not experiencing any problems, there is no need to return our call.  We will assume that you have returned to your regular daily activities without incident.  If any biopsies were taken you will be contacted by phone or by letter within the next 1-3 weeks.  Please call us at [phone] if you have not heard about the biopsies in 3 weeks.    SIGNATURES/CONFIDENTIALITY: You and/or your care partner have signed paperwork which will be entered into your electronic medical record.  These signatures attest to the fact that that the information above on your After Visit Summary has been reviewed and is understood.  Full responsibility of the  confidentiality of this discharge information lies with you and/or your care-partner. 

## 2017-07-10 NOTE — Progress Notes (Signed)
Report to PACU, RN, vss, BBS= Clear.  

## 2017-07-10 NOTE — Progress Notes (Signed)
Pt's states no medical or surgical changes since previsit or office visit. 

## 2017-07-11 ENCOUNTER — Telehealth: Payer: Self-pay

## 2017-07-11 NOTE — Telephone Encounter (Signed)
  Follow up Call-  Call back number 07/10/2017  Post procedure Call Back phone  # [phone]  Permission to leave phone message Yes  Some recent data might be hidden     Patient questions:  Do you have a fever, pain , or abdominal swelling? No. Pain Score  0 *  Have you tolerated food without any problems? Yes.    Have you been able to return to your normal activities? Yes.    Do you have any questions about your discharge instructions: Diet   No. Medications  No. Follow up visit  No.  Do you have questions or concerns about your Care? No.  Actions: * If pain score is 4 or above: No action needed, pain <4.

## 2017-07-14 ENCOUNTER — Ambulatory Visit (INDEPENDENT_AMBULATORY_CARE_PROVIDER_SITE_OTHER): Payer: Managed Care, Other (non HMO) | Admitting: *Deleted

## 2017-07-14 DIAGNOSIS — J4550 Severe persistent asthma, uncomplicated: Secondary | ICD-10-CM

## 2017-07-14 MED ORDER — BENRALIZUMAB 30 MG/ML ~~LOC~~ SOSY
30.0000 mg | PREFILLED_SYRINGE | SUBCUTANEOUS | Status: AC
  Administered 2017-07-14: 30 mg via SUBCUTANEOUS

## 2017-07-15 ENCOUNTER — Encounter: Payer: Self-pay | Admitting: Internal Medicine

## 2017-07-15 NOTE — Progress Notes (Signed)
1 diminutive  adenoma recall 2024 My Chart letter

## 2017-08-07 ENCOUNTER — Ambulatory Visit (INDEPENDENT_AMBULATORY_CARE_PROVIDER_SITE_OTHER): Payer: Managed Care, Other (non HMO) | Admitting: *Deleted

## 2017-08-07 DIAGNOSIS — J4550 Severe persistent asthma, uncomplicated: Secondary | ICD-10-CM | POA: Diagnosis not present

## 2017-08-09 ENCOUNTER — Encounter: Payer: Self-pay | Admitting: Family

## 2017-08-09 DIAGNOSIS — Z0000 Encounter for general adult medical examination without abnormal findings: Secondary | ICD-10-CM

## 2017-08-09 DIAGNOSIS — M109 Gout, unspecified: Secondary | ICD-10-CM

## 2017-08-09 NOTE — Telephone Encounter (Signed)
Dr Edilia Bo-- can you advise in PCP's absence?  There is a patient email from January where Melissa told pt she could switch him from Uloric to Allopurinol but I could find no other communication since then.

## 2017-08-10 MED ORDER — ALLOPURINOL 100 MG PO TABS: ORAL_TABLET | ORAL | 9 refills | Status: DC

## 2017-08-11 ENCOUNTER — Ambulatory Visit: Payer: Managed Care, Other (non HMO)

## 2017-08-16 ENCOUNTER — Telehealth: Payer: Self-pay | Admitting: Family

## 2017-08-16 ENCOUNTER — Encounter: Payer: Self-pay | Admitting: Family

## 2017-08-16 ENCOUNTER — Ambulatory Visit (HOSPITAL_BASED_OUTPATIENT_CLINIC_OR_DEPARTMENT_OTHER)
Admission: RE | Admit: 2017-08-16 | Discharge: 2017-08-16 | Disposition: A | Discharge status: Home / Self Care | Payer: Managed Care, Other (non HMO) | Source: Ambulatory Visit | Attending: Family | Admitting: Family

## 2017-08-16 ENCOUNTER — Ambulatory Visit: Payer: Managed Care, Other (non HMO) | Admitting: Family

## 2017-08-16 VITALS — BP 141/77 | HR 55 | Temp 97.8°F | Resp 16 | Ht 66.5 in | Wt 222.6 lb

## 2017-08-16 DIAGNOSIS — M25462 Effusion, left knee: Secondary | ICD-10-CM | POA: Insufficient documentation

## 2017-08-16 DIAGNOSIS — M25562 Pain in left knee: Secondary | ICD-10-CM

## 2017-08-16 DIAGNOSIS — M1712 Unilateral primary osteoarthritis, left knee: Secondary | ICD-10-CM | POA: Diagnosis not present

## 2017-08-16 LAB — CBC WITH DIFFERENTIAL/PLATELET
BASOS ABS: 0 10*3/uL (ref 0.0–0.1)
Basophils Relative: 0.1 % (ref 0.0–3.0)
EOS PCT: 0 % (ref 0.0–5.0)
Eosinophils Absolute: 0 10*3/uL (ref 0.0–0.7)
HCT: 45.7 % (ref 39.0–52.0)
HEMOGLOBIN: 15.7 g/dL (ref 13.0–17.0)
Lymphocytes Relative: 21.8 % (ref 12.0–46.0)
Lymphs Abs: 1.4 10*3/uL (ref 0.7–4.0)
MCHC: 34.4 g/dL (ref 30.0–36.0)
MCV: 91.9 fl (ref 78.0–100.0)
MONO ABS: 0.6 10*3/uL (ref 0.1–1.0)
MONOS PCT: 9 % (ref 3.0–12.0)
Neutro Abs: 4.6 10*3/uL (ref 1.4–7.7)
Neutrophils Relative %: 69.1 % (ref 43.0–77.0)
Platelets: 257 10*3/uL (ref 150.0–400.0)
RBC: 4.97 Mil/uL (ref 4.22–5.81)
RDW: 13.8 % (ref 11.5–15.5)
WBC: 6.6 10*3/uL (ref 4.0–10.5)

## 2017-08-16 LAB — URIC ACID: URIC ACID, SERUM: 5.7 mg/dL (ref 4.0–7.8)

## 2017-08-16 NOTE — Telephone Encounter (Signed)
See mychart.  

## 2017-08-16 NOTE — Progress Notes (Signed)
Subjective:    Patient ID: Michael [name], male    DOB: 07-15-1950, 67 y.o.   MRN: [id number]  HPI  Pt is a 67 yr old male who presents today with chief complaint of left sided knee pain. Reports that pain is isolated to the medial knee. Reports that symptoms started 2 months ago. Worsened this past weekend when he climbed up a ladder.  Now having trouble sleeping due to the pain.  No obvious trigger.  He reports that the knee has been swollen. He has tried meloxicam and ibuprofen which helps with the swelling.  Does not help the pain.  He reports that he increased from 100 to 200mg  over the weekend of allopurinol.     Review of Systems    see HPI  Past Medical History:  Diagnosis Date  . Allergy   . Arthritis   . Asthma   . Cancer (HCC)    basal cell and squamous cell carcinoma  . Cancer [facility])    prostate  . Clotting disorder ([place])   . Colon polyps   . GERD (gastroesophageal reflux disease)   . Gout   . High cholesterol   . History of hepatitis    due to drug interaction?  . History of pancreatitis 2016   ?due to colchicine  . History of pulmonary embolus (PE) 2016  . History of stomach ulcers   . Hypertension   . OSA (obstructive sleep apnea) 2015   has CPAP  . Sleep apnea   . Thyroid disease    hypothyroid  . Urinary incontinence      Social History   Socioeconomic History  . Marital status: Married    Spouse name: Not on file  . Number of children: Not on file  . Years of education: Not on file  . Highest education level: Not on file  Occupational History  . Not on file  Social Needs  . Financial resource strain: Not on file  . Food insecurity:    Worry: Not on file    Inability: Not on file  . Transportation needs:    Medical: Not on file    Non-medical: Not on file  Tobacco Use  . Smoking status: Never Smoker  . Smokeless tobacco: Never Used  Substance and Sexual Activity  . Alcohol use: Yes    Alcohol/week: 0.6 oz    Types: 1 Glasses  of wine per week  . Drug use: No  . Sexual activity: Not on file  Lifestyle  . Physical activity:    Days per week: Not on file    Minutes per session: Not on file  . Stress: Not on file  Relationships  . Social connections:    Talks on phone: Not on file    Gets together: Not on file    Attends religious service: Not on file    Active member of club or organization: Not on file    Attends meetings of clubs or organizations: Not on file    Relationship status: Not on file  . Intimate partner violence:    Fear of current or ex partner: Not on file    Emotionally abused: Not on file    Physically abused: Not on file    Forced sexual activity: Not on file  Other Topics Concern  . Not on file  Social History Narrative   Former Lawyer at [employer], now Optician, dispensing.    Married   3 daughters- all live  locally   Has 7 grandchildren and 1 great grandchild   Enjoys reading, Geophysicist/field seismologist    Past Surgical History:  Procedure Laterality Date  . BASAL CELL CARCINOMA EXCISION  2017   MOHS.  beneath right eye  . CHOLECYSTECTOMY  2001  . COLONOSCOPY     multiple - last 06/2017  . PENILE PROSTHESIS IMPLANT  2016  . PROSTATECTOMY  2009  . SINUS SURGERY WITH INSTATRAK  2014  . SQUAMOUS CELL CARCINOMA EXCISION  01/2017   nose  . TOTAL HIP ARTHROPLASTY  06/2016    Family History  Problem Relation Age of Onset  . Asthma Mother   . Arthritis Mother   . Skin cancer Mother        basal cell carcinoma  . Asthma Father   . Arthritis Father   . Diabetes Father   . Heart attack Father   . Hyperlipidemia Father   . Hypertension Father   . Kidney disease Father   . Diabetes Brother   . Breast cancer Maternal Grandmother   . Diabetes Paternal Grandfather   . Arthritis Brother   . Hyperlipidemia Brother   . Alcohol abuse Brother   . Colon cancer Neg Hx   . Esophageal cancer Neg Hx   . Liver cancer Neg Hx   . Pancreatic cancer Neg Hx   . Rectal cancer Neg Hx    . Stomach cancer Neg Hx     Allergies  Allergen Reactions  . Colchicine Other (See Comments)    Pancreatitis  . Moxifloxacin Palpitations  . Doxycycline Hives  . Statins Other (See Comments)    Joint stiffness  . Avelox [Moxifloxacin Hcl In Nacl] Other (See Comments)    SEIZURE  . Adhesive  [Tape] Other (See Comments)    "tears skin"  . Penicillins Rash    Current Outpatient Medications on File Prior to Visit  Medication Sig Dispense Refill  . albuterol (PROVENTIL HFA;VENTOLIN HFA) 108 (90 Base) MCG/ACT inhaler Inhale into the lungs.    Marland Kitchen allopurinol (ZYLOPRIM) 100 MG tablet Take 200 mg po daily (100 mg daily for the first week only) 60 tablet 9  . beclomethasone (QVAR) 80 MCG/ACT inhaler Inhale 2 puffs into the lungs 2 (two) times daily. 8.7 g 5  . Benralizumab (FASENRA) 30 MG/ML SOSY Inject 30 mg into the skin every 30 (thirty) days.    . budesonide-formoterol (SYMBICORT) 160-4.5 MCG/ACT inhaler Inhale 2 puffs into the lungs 2 (two) times daily. Rinse gargle and spit after use. 1 Inhaler 5  . Cholecalciferol (VITAMIN D3) 5000 units CAPS Take by mouth.    . docusate sodium (COLACE) 100 MG capsule Take 200 mg by mouth.    . EPINEPHrine 0.3 mg/0.3 mL IJ SOAJ injection Use as directed for severe allergic reactions 2 Device 1  . febuxostat (ULORIC) 40 MG tablet Take 1 tablet (40 mg total) by mouth daily. 90 tablet 1  . fexofenadine (ALLEGRA) 180 MG tablet Take 180 mg by mouth.    . fluticasone (FLOVENT HFA) 110 MCG/ACT inhaler Inhale 2 puffs into the lungs 2 (two) times daily. 1 Inhaler 5  . Fluticasone Propionate (XHANCE) 93 MCG/ACT EXHU Place 1 spray into the nose 2 (two) times daily. 32 mL 5  . ipratropium-albuterol (DUONEB) 0.5-2.5 (3) MG/3ML SOLN 3 mLs.    Marland Kitchen levothyroxine (SYNTHROID, LEVOTHROID) 75 MCG tablet Take 1 tablet (75 mcg total) by mouth daily before breakfast. 30 tablet 5  . losartan (COZAAR) 25 MG tablet Take 1 tablet (25 mg  total) by mouth daily. 90 tablet 1  .  MAGNESIUM PO Take 500 mg by mouth.    . montelukast (SINGULAIR) 10 MG tablet Take 1 tablet (10 mg total) by mouth at bedtime. 90 tablet 3  . Multiple Vitamins-Minerals (OCUVITE EXTRA PO) Take by mouth.    . Tiotropium Bromide Monohydrate (SPIRIVA RESPIMAT) 1.25 MCG/ACT AERS Inhale 2 puffs into the lungs daily. 3 Inhaler 3  . Zoster Vaccine Adjuvanted [facility]) injection Inject 0.5mg  IM now and again in 2-6 months. 0.5 mL 1   Current Facility-Administered Medications on File Prior to Visit  Medication Dose Route Frequency Provider Last Rate Last Dose  . 0.9 %  sodium chloride infusion  500 mL Intravenous Once Gatha Mayer, MD      . Benralizumab SOSY 30 mg  30 mg Subcutaneous Q28 days Bobbitt, [place] Muta, MD   30 mg at 07/14/17 0915    BP (!) 141/77 (BP Location: Left Arm, Patient Position: Sitting, Cuff Size: Large)   Pulse (!) 55   Temp 97.8 F (36.6 C) (Oral)   Resp 16   Ht 5' 6.5" (1.689 m)   Wt 222 lb 9.6 oz (101 kg)   SpO2 99%   BMI 35.39 kg/m    Objective:   Physical Exam  Constitutional: He is oriented to person, place, and time. He appears well-developed and well-nourished. No distress.  HENT:  Head: Normocephalic and atraumatic.  Cardiovascular: Normal rate and regular rhythm.  No murmur heard. Pulmonary/Chest: Effort normal and breath sounds normal. No respiratory distress. He has no wheezes. He has no rales.  Musculoskeletal: He exhibits no edema.  Left knee without erythema, mild swelling medially, + pain with flexion/extension  Neurological: He is alert and oriented to person, place, and time.  Skin: Skin is warm and dry.  Psychiatric: He has a normal mood and affect. His behavior is normal. Thought content normal.          Assessment & Plan:  Left knee pain- uric acid and cbc WNL. X ray shows degenerative changes. He has some oxycodone at home to use for severe pain, will refer to sports medicine for further evaluation.

## 2017-08-16 NOTE — Patient Instructions (Addendum)
Please complete lab work prior to leaving. Complete x-ray on the first floor. We will contact you with your results.

## 2017-08-22 ENCOUNTER — Ambulatory Visit: Payer: Managed Care, Other (non HMO) | Admitting: Family Medicine

## 2017-08-22 ENCOUNTER — Encounter: Payer: Self-pay | Admitting: Family Medicine

## 2017-08-22 DIAGNOSIS — M25562 Pain in left knee: Secondary | ICD-10-CM | POA: Diagnosis not present

## 2017-08-22 MED ORDER — METHYLPREDNISOLONE ACETATE 40 MG/ML IJ SUSP
40.0000 mg | Freq: Once | INTRAMUSCULAR | Status: AC
  Administered 2017-08-22: 40 mg via INTRA_ARTICULAR

## 2017-08-22 NOTE — Progress Notes (Signed)
PCP and consultation requested by: Debbrah Alar, NP  Subjective:   HPI: Patient is a 67 y.o. male here for left knee pain.  Patient reports he's had about 4 weeks of anterior left knee pain. No acute injury or trauma. He had been more active prior to this and is in the process of moving. Pain all the time up to 6/10 level and sharp. Swells more medially. Thought he was getting a flare of gout initially. Has been icing 2-3 times a day. No skin changes, numbness.  Past Medical History:  Diagnosis Date  . Allergy   . Arthritis   . Asthma   . Cancer (HCC)    basal cell and squamous cell carcinoma  . Cancer [facility])    prostate  . Clotting disorder ([place])   . Colon polyps   . GERD (gastroesophageal reflux disease)   . Gout   . High cholesterol   . History of hepatitis    due to drug interaction?  . History of pancreatitis 2016   ?due to colchicine  . History of pulmonary embolus (PE) 2016  . History of stomach ulcers   . Hypertension   . OSA (obstructive sleep apnea) 2015   has CPAP  . Sleep apnea   . Thyroid disease    hypothyroid  . Urinary incontinence     Current Outpatient Medications on File Prior to Visit  Medication Sig Dispense Refill  . albuterol (PROVENTIL HFA;VENTOLIN HFA) 108 (90 Base) MCG/ACT inhaler Inhale into the lungs.    Marland Kitchen allopurinol (ZYLOPRIM) 100 MG tablet Take 200 mg po daily (100 mg daily for the first week only) 60 tablet 9  . beclomethasone (QVAR) 80 MCG/ACT inhaler Inhale 2 puffs into the lungs 2 (two) times daily. 8.7 g 5  . Benralizumab (FASENRA) 30 MG/ML SOSY Inject 30 mg into the skin every 30 (thirty) days.    . budesonide-formoterol (SYMBICORT) 160-4.5 MCG/ACT inhaler Inhale 2 puffs into the lungs 2 (two) times daily. Rinse gargle and spit after use. 1 Inhaler 5  . Cholecalciferol (VITAMIN D3) 5000 units CAPS Take by mouth.    . docusate sodium (COLACE) 100 MG capsule Take 200 mg by mouth.    . EPINEPHrine 0.3 mg/0.3 mL IJ SOAJ  injection Use as directed for severe allergic reactions 2 Device 1  . febuxostat (ULORIC) 40 MG tablet Take 1 tablet (40 mg total) by mouth daily. 90 tablet 1  . fexofenadine (ALLEGRA) 180 MG tablet Take 180 mg by mouth.    . fluticasone (FLOVENT HFA) 110 MCG/ACT inhaler Inhale 2 puffs into the lungs 2 (two) times daily. 1 Inhaler 5  . Fluticasone Propionate (XHANCE) 93 MCG/ACT EXHU Place 1 spray into the nose 2 (two) times daily. 32 mL 5  . ipratropium-albuterol (DUONEB) 0.5-2.5 (3) MG/3ML SOLN 3 mLs.    Marland Kitchen levothyroxine (SYNTHROID, LEVOTHROID) 75 MCG tablet Take 1 tablet (75 mcg total) by mouth daily before breakfast. 30 tablet 5  . losartan (COZAAR) 25 MG tablet Take 1 tablet (25 mg total) by mouth daily. 90 tablet 1  . MAGNESIUM PO Take 500 mg by mouth.    . montelukast (SINGULAIR) 10 MG tablet Take 1 tablet (10 mg total) by mouth at bedtime. 90 tablet 3  . Multiple Vitamins-Minerals (OCUVITE EXTRA PO) Take by mouth.    . Tiotropium Bromide Monohydrate (SPIRIVA RESPIMAT) 1.25 MCG/ACT AERS Inhale 2 puffs into the lungs daily. 3 Inhaler 3  . Zoster Vaccine Adjuvanted [facility]) injection Inject 0.5mg  IM now and  again in 2-6 months. 0.5 mL 1   Current Facility-Administered Medications on File Prior to Visit  Medication Dose Route Frequency Provider Last Rate Last Dose  . 0.9 %  sodium chloride infusion  500 mL Intravenous Once Gatha Mayer, MD      . Benralizumab SOSY 30 mg  30 mg Subcutaneous Q28 days Bobbitt, [place] Muta, MD   30 mg at 07/14/17 0915    Past Surgical History:  Procedure Laterality Date  . BASAL CELL CARCINOMA EXCISION  2017   MOHS.  beneath right eye  . CHOLECYSTECTOMY  2001  . COLONOSCOPY     multiple - last 06/2017  . PENILE PROSTHESIS IMPLANT  2016  . PROSTATECTOMY  2009  . SINUS SURGERY WITH INSTATRAK  2014  . SQUAMOUS CELL CARCINOMA EXCISION  01/2017   nose  . TOTAL HIP ARTHROPLASTY  06/2016    Allergies  Allergen Reactions  . Colchicine Other (See  Comments)    Pancreatitis  . Moxifloxacin Palpitations  . Doxycycline Hives  . Statins Other (See Comments)    Joint stiffness  . Avelox [Moxifloxacin Hcl In Nacl] Other (See Comments)    SEIZURE  . Cefdinir   . Adhesive  [Tape] Other (See Comments)    "tears skin"  . Penicillins Rash    Social History   Socioeconomic History  . Marital status: Married    Spouse name: Not on file  . Number of children: Not on file  . Years of education: Not on file  . Highest education level: Not on file  Occupational History  . Not on file  Social Needs  . Financial resource strain: Not on file  . Food insecurity:    Worry: Not on file    Inability: Not on file  . Transportation needs:    Medical: Not on file    Non-medical: Not on file  Tobacco Use  . Smoking status: Never Smoker  . Smokeless tobacco: Never Used  Substance and Sexual Activity  . Alcohol use: Yes    Alcohol/week: 0.6 oz    Types: 1 Glasses of wine per week  . Drug use: No  . Sexual activity: Not on file  Lifestyle  . Physical activity:    Days per week: Not on file    Minutes per session: Not on file  . Stress: Not on file  Relationships  . Social connections:    Talks on phone: Not on file    Gets together: Not on file    Attends religious service: Not on file    Active member of club or organization: Not on file    Attends meetings of clubs or organizations: Not on file    Relationship status: Not on file  . Intimate partner violence:    Fear of current or ex partner: Not on file    Emotionally abused: Not on file    Physically abused: Not on file    Forced sexual activity: Not on file  Other Topics Concern  . Not on file  Social History Narrative   Former Lawyer at [employer], now Optician, dispensing.    Married   3 daughters- all live locally   Has 7 grandchildren and 1 great grandchild   Enjoys reading, Geophysicist/field seismologist    Family History  Problem Relation Age of Onset  . Asthma  Mother   . Arthritis Mother   . Skin cancer Mother        basal cell carcinoma  .  Asthma Father   . Arthritis Father   . Diabetes Father   . Heart attack Father   . Hyperlipidemia Father   . Hypertension Father   . Kidney disease Father   . Diabetes Brother   . Breast cancer Maternal Grandmother   . Diabetes Paternal Grandfather   . Arthritis Brother   . Hyperlipidemia Brother   . Alcohol abuse Brother   . Colon cancer Neg Hx   . Esophageal cancer Neg Hx   . Liver cancer Neg Hx   . Pancreatic cancer Neg Hx   . Rectal cancer Neg Hx   . Stomach cancer Neg Hx     BP (!) 150/97   Pulse 73   Ht 5\' 6"  (1.676 m)   Wt 220 lb (99.8 kg)   BMI 35.51 kg/m   Review of Systems: See HPI above.     Objective:  Physical Exam:  Gen: NAD, comfortable in exam room  Left knee: No gross deformity, ecchymoses.  Mild warmth and effusion. TTP medial joint line, less medial patellar facet. FROM with 5/5 strength. Negative ant/post drawers. Negative valgus/varus testing. Negative lachmanns. Negative mcmurrays, apleys, patellar apprehension. NV intact distally.  Right knee: No deformity. FROM with 5/5 strength. No tenderness to palpation. NVI distally.   Assessment & Plan:  1. Left knee pain - 2/2 flare of arthritis with synovitis confirmed on ultrsaound.  Discussed tylenol, topical medications, supplements that may help.  Intraarticular injection given today.  Icing.  Home exercises reviewed.  F/u in 1 month or prn.  After informed written consent timeout was performed, patient was lying supine on exam table. Left knee was prepped with alcohol swab and utilizing superolateral approach, patient's left knee was injected intraarticularly with 3:1 bupivicaine: depomedrol. Patient tolerated the procedure well without immediate complications.

## 2017-08-22 NOTE — Assessment & Plan Note (Signed)
2/2 flare of arthritis with synovitis confirmed on ultrsaound.  Discussed tylenol, topical medications, supplements that may help.  Intraarticular injection given today.  Icing.  Home exercises reviewed.  F/u in 1 month or prn.  After informed written consent timeout was performed, patient was lying supine on exam table. Left knee was prepped with alcohol swab and utilizing superolateral approach, patient's left knee was injected intraarticularly with 3:1 bupivicaine: depomedrol. Patient tolerated the procedure well without immediate complications.

## 2017-08-22 NOTE — Patient Instructions (Signed)
Your pain is due to arthritis. These are the different medications you can take for this: Tylenol 500mg  1-2 tabs three times a day for pain. Capsaicin, aspercreme, or biofreeze topically up to four times a day may also help with pain. Some supplements that may help for arthritis: Boswellia extract, curcumin, pycnogenol Minimize use of aleve, ibuprofen, or meloxicam Cortisone injections are an option - you were given this today. If cortisone injections do not help, there are different types of shots that may help but they take longer to take effect. It's important that you continue to stay active. Straight leg raises, knee extensions 3 sets of 10 once a day (add ankle weight if these become too easy). Consider physical therapy to strengthen muscles around the joint that hurts to take pressure off of the joint itself. Shoe inserts with good arch support may be helpful. Ice 15 minutes at a time 3-4 times a day as needed to help with pain. Water aerobics and cycling with low resistance are the best two types of exercise for arthritis though any exercise is ok as long as it doesn't worsen the pain. Follow up with me in 1 month or as needed.

## 2017-08-28 ENCOUNTER — Ambulatory Visit: Payer: Managed Care, Other (non HMO) | Admitting: Family Medicine

## 2017-08-28 ENCOUNTER — Encounter

## 2017-09-19 ENCOUNTER — Other Ambulatory Visit: Payer: Self-pay | Admitting: Family

## 2017-09-28 ENCOUNTER — Ambulatory Visit: Payer: Managed Care, Other (non HMO) | Admitting: Medical

## 2017-09-28 ENCOUNTER — Ambulatory Visit: Payer: Self-pay | Admitting: *Deleted

## 2017-09-28 ENCOUNTER — Encounter: Payer: Self-pay | Admitting: Medical

## 2017-09-28 VITALS — BP 130/78 | HR 62 | Temp 97.8°F | Resp 16 | Ht 66.0 in | Wt 217.6 lb

## 2017-09-28 DIAGNOSIS — M791 Myalgia, unspecified site: Secondary | ICD-10-CM

## 2017-09-28 DIAGNOSIS — M2550 Pain in unspecified joint: Secondary | ICD-10-CM | POA: Diagnosis not present

## 2017-09-28 LAB — URIC ACID: Uric Acid, Serum: 4.6 mg/dL (ref 4.0–7.8)

## 2017-09-28 LAB — SEDIMENTATION RATE: Sed Rate: 2 mm/hr (ref 0–20)

## 2017-09-28 LAB — C-REACTIVE PROTEIN: CRP: 0.3 mg/dL — ABNORMAL LOW (ref 0.5–20.0)

## 2017-09-28 MED ORDER — GABAPENTIN 100 MG PO CAPS: 100.0000 mg | ORAL_CAPSULE | Freq: Every day | ORAL | 0 refills | Status: DC

## 2017-09-28 NOTE — Patient Instructions (Addendum)
For your history of arthralgias and myalgias, we will get inflammatory studies.  You do have some limits on which medications you can take based on your medical history.  After discussion today decided to avoid prednisone.  Also want to avoid daily use of NSAIDs.  You can tolerate tramadol and would recommend max use of that to 1 tablet daily.  I think we could try gabapentin 100 mg 1 tablet at night presently.  If you use gabapentin advised that she use tramadol 8 to 12 hours apart..  Also you can use Tylenol and occasionally a supplement with low-dose ibuprofen 200 to 400 mg.  We will follow lab results and notify you of those.  Follow-up in 7 days or as needed.  If you are doing well with the gabapentin then would ask that you at least MyChart [place] update.  Regarding your slight tingling sensation to your nose, we discussed your history of shingles on the right side of your face.  If you had any shingles type outbreak and notify us immediately and I would make antiviral medication available.  Also if any gross motor or sensory function deficits occur with nose  Mackie Pai, PA-C tingling then advised ED evaluation as CT imaging would be needed.

## 2017-09-28 NOTE — Telephone Encounter (Signed)
Patient has appointment with Mackie Pai today.

## 2017-09-28 NOTE — Progress Notes (Signed)
Subjective:    Patient ID: Michael [name], male    DOB: October 07, 1950, 67 y.o.   MRN: [id number]  HPI   Pt in with some tingling in his left  thumb and 2nd digitt. Also some recently in his rt thumb. This has been going on for years.  He states his body aches as well. Different joint pain. He also has some gout and get occasional flares.  Pt states he was walking about 12-15 month about one and half month ago.  He states combination of join pains and stiffness. Known osteoarthritis. Left hip replacement.  Pt also states some rare intetmittent transient tingling sensation to tip of his nose. Pt has no rash to nose or on face. Last fall he had Mohr months ago .Tingling to nose only last couple of weeks. No associated gross motor or sensory function deficits.  Pt states pain in his knee keeping him up at night. Left side.No fall or trauma.  Pt above arthralgia and muscle aches seemed to flare when he stopped the meloxicam about 2 months ago.  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 In past he used prednisone very frequently.   In the past about 10 years ago had seen rheumatoid arthritis.   Left foot is most painful. Left knee also very painful.  Pt can't take colchicine(He states got pancreatitis from that). Only on allopurinol 200 mg a day for gout history.  Pt states he notes association with asthma symptoms and joint pain. He notes meloxicam may flare asthma.   Tylenol does not help.  Pt states he does have tramadol available to use at home if he needs. At most just uses once at night.  When uses left hand fingers get tingly. Rt hand with tingles as well.    Review of Systems  Constitutional: Negative for chills, fatigue and fever.  HENT: Negative for congestion.   Respiratory: Negative for cough, chest tightness, shortness of breath and wheezing.   Cardiovascular: Negative for chest pain and palpitations.  Gastrointestinal:  Negative for abdominal pain.  Musculoskeletal: Positive for myalgias. Negative for back pain.       Arthralgias  See hpi.  Skin: Negative for rash.  Neurological: Negative for dizziness, speech difficulty, weakness and headaches.  Hematological: Negative for adenopathy. Does not bruise/bleed easily.  Psychiatric/Behavioral: Negative for behavioral problems, decreased concentration and suicidal ideas. The patient is not nervous/anxious.     Past Medical History:  Diagnosis Date  . Allergy   . Arthritis   . Asthma   . Cancer (HCC)    basal cell and squamous cell carcinoma  . Cancer [facility])    prostate  . Clotting disorder ([place])   . Colon polyps   . GERD (gastroesophageal reflux disease)   . Gout   . High cholesterol   . History of hepatitis    due to drug interaction?  . History of pancreatitis 2016   ?due to colchicine  . History of pulmonary embolus (PE) 2016  . History of stomach ulcers   . Hypertension   . OSA (obstructive sleep apnea) 2015   has CPAP  . Sleep apnea   . Thyroid disease    hypothyroid  . Urinary incontinence      Social History   Socioeconomic History  . Marital status: Married    Spouse name: Not on file  . Number of children: Not on file  . Years of education: Not on file  .  Highest education level: Not on file  Occupational History  . Not on file  Social Needs  . Financial resource strain: Not on file  . Food insecurity:    Worry: Not on file    Inability: Not on file  . Transportation needs:    Medical: Not on file    Non-medical: Not on file  Tobacco Use  . Smoking status: Never Smoker  . Smokeless tobacco: Never Used  Substance and Sexual Activity  . Alcohol use: Yes    Alcohol/week: 0.6 oz    Types: 1 Glasses of wine per week  . Drug use: No  . Sexual activity: Not on file  Lifestyle  . Physical activity:    Days per week: Not on file    Minutes per session: Not on file  . Stress: Not on file  Relationships  . Social  connections:    Talks on phone: Not on file    Gets together: Not on file    Attends religious service: Not on file    Active member of club or organization: Not on file    Attends meetings of clubs or organizations: Not on file    Relationship status: Not on file  . Intimate partner violence:    Fear of current or ex partner: Not on file    Emotionally abused: Not on file    Physically abused: Not on file    Forced sexual activity: Not on file  Other Topics Concern  . Not on file  Social History Narrative   Former Lawyer at [employer], now Optician, dispensing.    Married   3 daughters- all live locally   Has 7 grandchildren and 1 great grandchild   Enjoys reading, Geophysicist/field seismologist    Past Surgical History:  Procedure Laterality Date  . BASAL CELL CARCINOMA EXCISION  2017   MOHS.  beneath right eye  . CHOLECYSTECTOMY  2001  . COLONOSCOPY     multiple - last 06/2017  . PENILE PROSTHESIS IMPLANT  2016  . PROSTATECTOMY  2009  . SINUS SURGERY WITH INSTATRAK  2014  . SQUAMOUS CELL CARCINOMA EXCISION  01/2017   nose  . TOTAL HIP ARTHROPLASTY  06/2016    Family History  Problem Relation Age of Onset  . Asthma Mother   . Arthritis Mother   . Skin cancer Mother        basal cell carcinoma  . Asthma Father   . Arthritis Father   . Diabetes Father   . Heart attack Father   . Hyperlipidemia Father   . Hypertension Father   . Kidney disease Father   . Diabetes Brother   . Breast cancer Maternal Grandmother   . Diabetes Paternal Grandfather   . Arthritis Brother   . Hyperlipidemia Brother   . Alcohol abuse Brother   . Colon cancer Neg Hx   . Esophageal cancer Neg Hx   . Liver cancer Neg Hx   . Pancreatic cancer Neg Hx   . Rectal cancer Neg Hx   . Stomach cancer Neg Hx     Allergies  Allergen Reactions  . Colchicine Other (See Comments)    Pancreatitis  . Moxifloxacin Palpitations  . Doxycycline Hives  . Statins Other (See Comments)    Joint  stiffness  . Avelox [Moxifloxacin Hcl In Nacl] Other (See Comments)    SEIZURE  . Cefdinir   . Adhesive  [Tape] Other (See Comments)    "tears skin"  . Penicillins  Rash    Current Outpatient Medications on File Prior to Visit  Medication Sig Dispense Refill  . albuterol (PROVENTIL HFA;VENTOLIN HFA) 108 (90 Base) MCG/ACT inhaler Inhale into the lungs.    Marland Kitchen allopurinol (ZYLOPRIM) 100 MG tablet Take 200 mg po daily (100 mg daily for the first week only) 60 tablet 9  . beclomethasone (QVAR) 80 MCG/ACT inhaler Inhale 2 puffs into the lungs 2 (two) times daily. 8.7 g 5  . Benralizumab (FASENRA) 30 MG/ML SOSY Inject 30 mg into the skin every 30 (thirty) days.    . budesonide-formoterol (SYMBICORT) 160-4.5 MCG/ACT inhaler Inhale 2 puffs into the lungs 2 (two) times daily. Rinse gargle and spit after use. 1 Inhaler 5  . Cholecalciferol (VITAMIN D3) 5000 units CAPS Take by mouth.    . docusate sodium (COLACE) 100 MG capsule Take 200 mg by mouth.    . EPINEPHrine 0.3 mg/0.3 mL IJ SOAJ injection Use as directed for severe allergic reactions 2 Device 1  . fexofenadine (ALLEGRA) 180 MG tablet Take 180 mg by mouth.    . fluticasone (FLOVENT HFA) 110 MCG/ACT inhaler Inhale 2 puffs into the lungs 2 (two) times daily. 1 Inhaler 5  . Fluticasone Propionate (XHANCE) 93 MCG/ACT EXHU Place 1 spray into the nose 2 (two) times daily. 32 mL 5  . ipratropium-albuterol (DUONEB) 0.5-2.5 (3) MG/3ML SOLN 3 mLs.    Marland Kitchen levothyroxine (SYNTHROID, LEVOTHROID) 75 MCG tablet Take 1 tablet (75 mcg total) by mouth daily before breakfast. 30 tablet 5  . losartan (COZAAR) 25 MG tablet TAKE 1 TABLET(25 MG) BY MOUTH DAILY 90 tablet 1  . MAGNESIUM PO Take 500 mg by mouth.    . montelukast (SINGULAIR) 10 MG tablet Take 1 tablet (10 mg total) by mouth at bedtime. 90 tablet 3  . Multiple Vitamins-Minerals (OCUVITE EXTRA PO) Take by mouth.    . Tiotropium Bromide Monohydrate (SPIRIVA RESPIMAT) 1.25 MCG/ACT AERS Inhale 2 puffs into the  lungs daily. 3 Inhaler 3  . ULORIC 40 MG tablet TAKE 1 TABLET(40 MG) BY MOUTH DAILY 90 tablet 1  . Zoster Vaccine Adjuvanted [facility]) injection Inject 0.5mg  IM now and again in 2-6 months. 0.5 mL 1   Current Facility-Administered Medications on File Prior to Visit  Medication Dose Route Frequency Provider Last Rate Last Dose  . 0.9 %  sodium chloride infusion  500 mL Intravenous Once Gatha Mayer, MD        BP 130/78   Pulse 62   Temp 97.8 F (36.6 C) (Oral)   Resp 16   Ht 5\' 6"  (1.676 m)   Wt 217 lb 9.6 oz (98.7 kg)   SpO2 100%   BMI 35.12 kg/m       Objective:   Physical Exam  General Mental Status- Alert. General Appearance- Not in acute distress.   Skin General: Color- Normal Color. Moisture- Normal Moisture.  Neck Carotid Arteries- Normal color. Moisture- Normal Moisture. No carotid bruits. No JVD.  Chest and Lung Exam Auscultation: Breath Sounds:-Normal.  Cardiovascular Auscultation:Rythm- Regular. Murmurs & Other Heart Sounds:Auscultation of the heart reveals- No Murmurs.  Abdomen Inspection:-Inspeection Normal. Palpation/Percussion:Note:No mass. Palpation and Percussion of the abdomen reveal- Non Tender, Non Distended + BS, no rebound or guarding.   Neurologic Cranial Nerve exam:- CN III-XII intact(No nystagmus), symmetric smile. Drift Test:- No drift. Romberg Exam:- Negative.  Heal to Toe Gait exam:-poor. But not good since his hip placement. Finger to Nose:- Normal/Intact Strength:- 5/5 equal and symmetric strength both upper and lower  extremities.   Left upper ext- negative phalens sign. Rt upper ext- negative phalens sign. Rt elbow- pain on range of motion. Left knee- some pain on rom.      Assessment & Plan:  For your history of arthralgias and myalgias, we will get inflammatory studies.  You do have some limits on which medications you can take based on your medical history.  After discussion today decided to avoid prednisone.  Also  want to avoid daily use of NSAIDs.  You can tolerate tramadol and would recommend max use of that to 1 tablet daily.  I think we could try gabapentin 100 mg 1 tablet at night presently.  If you use gabapentin advised that she use tramadol 8 to 12 hours apart..  Also you can use Tylenol and occasionally a supplement with low-dose ibuprofen 200 to 400 mg.  We will follow lab results and notify you of those.  Follow-up in 7 days or as needed.  If you are doing well with the gabapentin then would ask that you at least MyChart [place] update.  Regarding your slight tingling sensation to your nose, we discussed your history of shingles on the right side of your face.  If you had any shingles type outbreak and notify us immediately and I would make antiviral medication available.  Also if any gross motor or sensory function deficits occur with nose tingling then advised ED evaluation as CT imaging would be needed.

## 2017-09-28 NOTE — Telephone Encounter (Signed)
Pt called with numbness and tingling in his left hand esp the thumb and tip of his nose. The numbness never  go away.  He states also has trouble walking some times and has gotten worst and now some what better. He feels very stiff.  Appointment scheduled for today. Will route to flow at [facility]. Pt advised to call 911 if his symptoms worsen before his appointment. Pt voiced understanding.  Reason for Disposition . [1] Numbness (i.e., loss of sensation) of the face, arm / hand, or leg / foot on one side of the body AND [2] gradual onset (e.g., days to weeks) AND [3] present now  Answer Assessment - Initial Assessment Questions 1. SYMPTOM: "What is the main symptom you are concerned about?" (e.g., weakness, numbness)     Numbness, tingling 2. ONSET: "When did this start?" (minutes, hours, days; while sleeping)     3 weeks ago 3. LAST NORMAL: "When was the last time you were normal (no symptoms)?"     About 3 weeks ago 4. PATTERN "Does this come and go, or has it been constant since it started?"  "Is it present now?"     Numbness is constant 5. CARDIAC SYMPTOMS: "Have you had any of the following symptoms: chest pain, difficulty breathing, palpitations?"     no 6. NEUROLOGIC SYMPTOMS: "Have you had any of the following symptoms: headache, dizziness, vision loss, double vision, changes in speech, unsteady on your feet?"     Struggles with vision, being blurry and unsteady on feet now somewhat better 7. OTHER SYMPTOMS: "Do you have any other symptoms?"     Stiffness in legs, sometimes trouble lifting things 8. PREGNANCY: "Is there any chance you are pregnant?" "When was your last menstrual period?"     n/a  Protocols used: NEUROLOGIC DEFICIT-A-AH

## 2017-09-29 ENCOUNTER — Telehealth: Payer: Self-pay | Admitting: *Deleted

## 2017-09-29 NOTE — Telephone Encounter (Signed)
Patient called and requested call regarding his Fasenra injections and insurance payment. Looks like his 08/07/17 injection went towards deductible.  I wil advise him how to get reimbursement from [employer] for injection costs

## 2017-10-02 ENCOUNTER — Ambulatory Visit (INDEPENDENT_AMBULATORY_CARE_PROVIDER_SITE_OTHER): Payer: Managed Care, Other (non HMO)

## 2017-10-02 DIAGNOSIS — J4550 Severe persistent asthma, uncomplicated: Secondary | ICD-10-CM

## 2017-10-02 LAB — RHEUMATOID FACTOR

## 2017-10-02 LAB — ANA: Anti Nuclear Antibody(ANA): NEGATIVE

## 2017-10-02 MED ORDER — BENRALIZUMAB 30 MG/ML ~~LOC~~ SOSY
30.0000 mg | PREFILLED_SYRINGE | SUBCUTANEOUS | Status: DC
  Administered 2017-10-02 – 2019-07-12 (×10): 30 mg via SUBCUTANEOUS

## 2017-10-02 NOTE — Telephone Encounter (Signed)
Discussed with patient he had rcvd EOB for his drug that showed he had balance of $1000 after insurance paid.  I advised him to contact pharmacy he should have copay card on file that should take care of balance. His EOB only showed what Ins. Paid not copay card picked up

## 2017-10-05 ENCOUNTER — Other Ambulatory Visit: Payer: Self-pay

## 2017-10-05 MED ORDER — BUDESONIDE-FORMOTEROL FUMARATE 160-4.5 MCG/ACT IN AERO: 2.0000 | INHALATION_SPRAY | Freq: Two times a day (BID) | RESPIRATORY_TRACT | 0 refills | Status: DC

## 2017-10-26 ENCOUNTER — Telehealth: Payer: Self-pay | Admitting: Medical

## 2017-10-27 NOTE — Telephone Encounter (Signed)
Michael [name] -- pt saw Percell Miller on 09/28/17 and was prescribed gabapentin. Refill is being requested. Please advise if refills are appropriate for this medication?

## 2017-10-30 NOTE — Telephone Encounter (Signed)
Michael [name]- thanks.

## 2017-11-14 ENCOUNTER — Telehealth: Payer: Self-pay

## 2017-11-14 ENCOUNTER — Encounter: Payer: Self-pay | Admitting: *Deleted

## 2017-11-14 ENCOUNTER — Encounter: Payer: Self-pay | Admitting: Allergy and Immunology

## 2017-11-14 NOTE — Telephone Encounter (Signed)
-----   Message from [place], Generic sent at 11/14/2017 7:52 AM EDT -----    Dr. Starling Manns, I'm going to call today and cancel my Berna Bue shot for August 5th. My insurance company has decided that my portion of that cost is $1,000 every other month, basically 20% of the over $5,000 cost. Even after satisfying the deductible, it's over $400 a month. Obviously I'm not excited about giving up this therapy but I believe you can understand that it's not affordable until I get better health insurance. We have to consider alternatives at this point. Thanks for your understanding.

## 2017-11-14 NOTE — Telephone Encounter (Signed)
Is there anything we can do about this, Tammy?

## 2017-11-14 NOTE — Telephone Encounter (Signed)
I will reach out to patient.  I called and verified with Fasenra copay program and even with $1000 copay he has $10,000 left of his $13000 copay to offset cost of medication.  I did leave him message to contact me so I can advise this info.

## 2017-11-14 NOTE — Telephone Encounter (Signed)
Noted  

## 2017-11-16 ENCOUNTER — Encounter: Payer: Self-pay | Admitting: Pharmacist

## 2017-11-16 NOTE — Progress Notes (Signed)
Med clarification---patient taking allopurinol. Febuxostat removed from list.

## 2017-11-20 ENCOUNTER — Other Ambulatory Visit: Payer: Self-pay | Admitting: Allergy

## 2017-11-20 MED ORDER — BUDESONIDE-FORMOTEROL FUMARATE 160-4.5 MCG/ACT IN AERO: 2.0000 | INHALATION_SPRAY | Freq: Two times a day (BID) | RESPIRATORY_TRACT | 5 refills | Status: DC

## 2017-11-21 ENCOUNTER — Telehealth: Payer: Self-pay | Admitting: Allergy

## 2017-11-21 ENCOUNTER — Other Ambulatory Visit: Payer: Self-pay | Admitting: Allergy

## 2017-11-21 NOTE — Telephone Encounter (Signed)
Did not refill Incruse Ellipta. Dr. Verlin Fester discontinued on last visit.

## 2017-11-27 ENCOUNTER — Ambulatory Visit: Payer: Self-pay

## 2017-12-01 ENCOUNTER — Ambulatory Visit (INDEPENDENT_AMBULATORY_CARE_PROVIDER_SITE_OTHER): Payer: Managed Care, Other (non HMO) | Admitting: *Deleted

## 2017-12-01 DIAGNOSIS — J4550 Severe persistent asthma, uncomplicated: Secondary | ICD-10-CM

## 2017-12-04 ENCOUNTER — Other Ambulatory Visit: Payer: Self-pay | Admitting: *Deleted

## 2017-12-07 ENCOUNTER — Telehealth: Payer: Self-pay

## 2017-12-07 MED ORDER — UMECLIDINIUM BROMIDE 62.5 MCG/INH IN AEPB: 1.0000 | INHALATION_SPRAY | Freq: Every day | RESPIRATORY_TRACT | 1 refills | Status: DC

## 2017-12-07 NOTE — Telephone Encounter (Addendum)
Pharmacy requesting refill on Incruse Ellipta 62.5 mcg.  Per chart note 06/08/17, Dr. Verlin Fester states:  "Incruse Ellipta was not prescribed during his previous visit in this office and he is uncertain why he is taking this medication."  Patient is on Spiriva Respimat but this is not a covered drug per [employer].  PA was sent in but denied.  Please advise.

## 2017-12-07 NOTE — Addendum Note (Signed)
Addended byOralia Rud M on: 12/07/2017 12:26 PM   Modules accepted: Orders

## 2017-12-07 NOTE — Telephone Encounter (Signed)
Reviewed note, let us send in the Incruse Ellipta one puff once daily, which will replace the Spiriva.  Salvatore Marvel, MD [facility]

## 2017-12-07 NOTE — Telephone Encounter (Signed)
Sent in Rx.  Patient notified.

## 2017-12-14 ENCOUNTER — Telehealth: Payer: Self-pay

## 2017-12-14 NOTE — Telephone Encounter (Signed)
Spiriva Respimat was approved on 12/13/17 the representative Lennette Bihari) stated there isn't a case I.D. number associated with the approval.

## 2017-12-21 ENCOUNTER — Telehealth: Payer: Self-pay | Admitting: Family

## 2017-12-22 NOTE — Telephone Encounter (Signed)
Michael [name] -- levothyroxine refill sent to pharmacy. Pt last ov was acute 08/16/17 and has no future OV scheduled. Please advise next follow up?

## 2017-12-25 NOTE — Telephone Encounter (Signed)
I'd like to see him sometime in September please.

## 2017-12-26 NOTE — Telephone Encounter (Signed)
Please call pt to schedule appt. Thanks!

## 2017-12-27 NOTE — Telephone Encounter (Signed)
Called pt and LVM informing pt that his refill request for Levothyroxine was approved but he is due for a follow up. Melissa would like to see him sometime in Sept. Advised pt to call back to schedule an OV.

## 2018-01-19 ENCOUNTER — Encounter: Payer: Self-pay | Admitting: Family

## 2018-01-19 ENCOUNTER — Ambulatory Visit: Payer: Managed Care, Other (non HMO) | Admitting: Family

## 2018-01-19 VITALS — BP 155/96 | HR 58 | Temp 97.8°F | Resp 16 | Ht 66.0 in | Wt 229.0 lb

## 2018-01-19 DIAGNOSIS — G4733 Obstructive sleep apnea (adult) (pediatric): Secondary | ICD-10-CM | POA: Diagnosis not present

## 2018-01-19 DIAGNOSIS — E785 Hyperlipidemia, unspecified: Secondary | ICD-10-CM

## 2018-01-19 DIAGNOSIS — E039 Hypothyroidism, unspecified: Secondary | ICD-10-CM | POA: Diagnosis not present

## 2018-01-19 DIAGNOSIS — M109 Gout, unspecified: Secondary | ICD-10-CM

## 2018-01-19 DIAGNOSIS — Z23 Encounter for immunization: Secondary | ICD-10-CM | POA: Diagnosis not present

## 2018-01-19 DIAGNOSIS — I10 Essential (primary) hypertension: Secondary | ICD-10-CM

## 2018-01-19 LAB — BASIC METABOLIC PANEL
BUN: 19 mg/dL (ref 6–23)
CO2: 30 mEq/L (ref 19–32)
Calcium: 9.4 mg/dL (ref 8.4–10.5)
Chloride: 104 mEq/L (ref 96–112)
Creatinine, Ser: 1.27 mg/dL (ref 0.40–1.50)
GFR: 60.36 mL/min (ref >60.00)
Glucose, Bld: 91 mg/dL (ref 70–99)
POTASSIUM: 4.8 meq/L (ref 3.5–5.1)
Sodium: 139 mEq/L (ref 135–145)

## 2018-01-19 LAB — LIPID PANEL
Cholesterol: 173 mg/dL (ref 0–200)
HDL: 38.1 mg/dL — ABNORMAL LOW (ref >39.00)
LDL CALC: 102 mg/dL — AB (ref 0–99)
NONHDL: 135.23
Total CHOL/HDL Ratio: 5
Triglycerides: 168 mg/dL — ABNORMAL HIGH (ref 0.0–149.0)
VLDL: 33.6 mg/dL (ref 0.0–40.0)

## 2018-01-19 LAB — TSH: TSH: 1.63 u[IU]/mL (ref 0.35–4.50)

## 2018-01-19 MED ORDER — LOSARTAN POTASSIUM 50 MG PO TABS: 50.0000 mg | ORAL_TABLET | Freq: Every day | ORAL | 2 refills | Status: DC

## 2018-01-19 NOTE — Progress Notes (Signed)
Subjective:    Patient ID: Michael [name], male    DOB: 03-13-1951, 67 y.o.   MRN: [id number]  HPI  Patient is a 67 yr old male who presents today for follow up.  1) Hypothyroid- maintained on synthroid 75 mcg.   Lab Results  Component Value Date   TSH 2.27 03/24/2017   2) HTN- maintained on losartan 25mg .   BP Readings from Last 3 Encounters:  01/19/18 (!) 155/96  09/28/17 130/78  08/22/17 (!) 150/97   3) Hyperlipidemia-  Lab Results  Component Value Date   CHOL 198 03/24/2017   HDL 43.20 03/24/2017   LDLCALC 125 (H) 03/24/2017   TRIG 151.0 (H) 03/24/2017   CHOLHDL 5 03/24/2017   4) Gout- maintained on allopurinol  Reports LE edema in his legs.    Reports that he was taking gabapentin for knee pain.  In place of tramadol.  Review of Systems See HPI  Past Medical History:  Diagnosis Date  . Allergy   . Arthritis   . Asthma   . Cancer (HCC)    basal cell and squamous cell carcinoma  . Cancer [facility])    prostate  . Clotting disorder ([place])   . Colon polyps   . GERD (gastroesophageal reflux disease)   . Gout   . High cholesterol   . History of hepatitis    due to drug interaction?  . History of pancreatitis 2016   ?due to colchicine  . History of pulmonary embolus (PE) 2016  . History of stomach ulcers   . Hypertension   . OSA (obstructive sleep apnea) 2015   has CPAP  . Sleep apnea   . Thyroid disease    hypothyroid  . Urinary incontinence      Social History   Socioeconomic History  . Marital status: Married    Spouse name: Not on file  . Number of children: Not on file  . Years of education: Not on file  . Highest education level: Not on file  Occupational History  . Not on file  Social Needs  . Financial resource strain: Not on file  . Food insecurity:    Worry: Not on file    Inability: Not on file  . Transportation needs:    Medical: Not on file    Non-medical: Not on file  Tobacco Use  . Smoking status: Never Smoker  .  Smokeless tobacco: Never Used  Substance and Sexual Activity  . Alcohol use: Yes    Alcohol/week: 1.0 standard drinks    Types: 1 Glasses of wine per week  . Drug use: No  . Sexual activity: Not on file  Lifestyle  . Physical activity:    Days per week: Not on file    Minutes per session: Not on file  . Stress: Not on file  Relationships  . Social connections:    Talks on phone: Not on file    Gets together: Not on file    Attends religious service: Not on file    Active member of club or organization: Not on file    Attends meetings of clubs or organizations: Not on file    Relationship status: Not on file  . Intimate partner violence:    Fear of current or ex partner: Not on file    Emotionally abused: Not on file    Physically abused: Not on file    Forced sexual activity: Not on file  Other Topics Concern  . Not on  file  Social History Narrative   Former Lawyer at [employer], now Optician, dispensing.    Married   3 daughters- all live locally   Has 7 grandchildren and 1 great grandchild   Enjoys reading, Geophysicist/field seismologist    Past Surgical History:  Procedure Laterality Date  . BASAL CELL CARCINOMA EXCISION  2017   MOHS.  beneath right eye  . CHOLECYSTECTOMY  2001  . COLONOSCOPY     multiple - last 06/2017  . PENILE PROSTHESIS IMPLANT  2016  . PROSTATECTOMY  2009  . SINUS SURGERY WITH INSTATRAK  2014  . SQUAMOUS CELL CARCINOMA EXCISION  01/2017   nose  . TOTAL HIP ARTHROPLASTY  06/2016    Family History  Problem Relation Age of Onset  . Asthma Mother   . Arthritis Mother   . Skin cancer Mother        basal cell carcinoma  . Asthma Father   . Arthritis Father   . Diabetes Father   . Heart attack Father   . Hyperlipidemia Father   . Hypertension Father   . Kidney disease Father   . Diabetes Brother   . Breast cancer Maternal Grandmother   . Diabetes Paternal Grandfather   . Arthritis Brother   . Hyperlipidemia Brother   . Alcohol abuse  Brother   . Colon cancer Neg Hx   . Esophageal cancer Neg Hx   . Liver cancer Neg Hx   . Pancreatic cancer Neg Hx   . Rectal cancer Neg Hx   . Stomach cancer Neg Hx     Allergies  Allergen Reactions  . Colchicine Other (See Comments)    Pancreatitis  . Moxifloxacin Palpitations  . Doxycycline Hives  . Statins Other (See Comments)    Joint stiffness  . Avelox [Moxifloxacin Hcl In Nacl] Other (See Comments)    SEIZURE  . Cefdinir   . Adhesive  [Tape] Other (See Comments)    "tears skin"  . Penicillins Rash    Current Outpatient Medications on File Prior to Visit  Medication Sig Dispense Refill  . albuterol (PROVENTIL HFA;VENTOLIN HFA) 108 (90 Base) MCG/ACT inhaler Inhale into the lungs.    Marland Kitchen allopurinol (ZYLOPRIM) 100 MG tablet Take 200 mg po daily (100 mg daily for the first week only) 60 tablet 9  . beclomethasone (QVAR) 80 MCG/ACT inhaler Inhale 2 puffs into the lungs 2 (two) times daily. 8.7 g 5  . Benralizumab (FASENRA) 30 MG/ML SOSY Inject 30 mg into the skin every 30 (thirty) days.    . budesonide-formoterol (SYMBICORT) 160-4.5 MCG/ACT inhaler Inhale 2 puffs into the lungs 2 (two) times daily. Rinse gargle and spit after use 1 Inhaler 5  . Cholecalciferol (VITAMIN D3) 5000 units CAPS Take by mouth.    . docusate sodium (COLACE) 100 MG capsule Take 200 mg by mouth.    . EPINEPHrine 0.3 mg/0.3 mL IJ SOAJ injection Use as directed for severe allergic reactions 2 Device 1  . fexofenadine (ALLEGRA) 180 MG tablet Take 180 mg by mouth.    . fluticasone (FLOVENT HFA) 110 MCG/ACT inhaler Inhale 2 puffs into the lungs 2 (two) times daily. 1 Inhaler 5  . Fluticasone Propionate (XHANCE) 93 MCG/ACT EXHU Place 1 spray into the nose 2 (two) times daily. 32 mL 5  . gabapentin (NEURONTIN) 100 MG capsule TAKE 1 CAPSULE(100 MG) BY MOUTH AT BEDTIME 30 capsule 2  . ipratropium-albuterol (DUONEB) 0.5-2.5 (3) MG/3ML SOLN 3 mLs.    Marland Kitchen levothyroxine (SYNTHROID,  LEVOTHROID) 75 MCG tablet TAKE 1  TABLET(75 MCG) BY MOUTH DAILY BEFORE BREAKFAST 30 tablet 0  . MAGNESIUM PO Take 500 mg by mouth.    . montelukast (SINGULAIR) 10 MG tablet Take 1 tablet (10 mg total) by mouth at bedtime. 90 tablet 3  . Multiple Vitamins-Minerals (OCUVITE EXTRA PO) Take by mouth.    . umeclidinium bromide (INCRUSE ELLIPTA) 62.5 MCG/INH AEPB Inhale 1 puff into the lungs daily. 30 each 1  . Zoster Vaccine Adjuvanted [facility]) injection Inject 0.5mg  IM now and again in 2-6 months. 0.5 mL 1   Current Facility-Administered Medications on File Prior to Visit  Medication Dose Route Frequency Provider Last Rate Last Dose  . 0.9 %  sodium chloride infusion  500 mL Intravenous Once Gatha Mayer, MD      . Benralizumab SOSY 30 mg  30 mg Subcutaneous Q8 Weeks Bobbitt, [place] Muta, MD   30 mg at 12/01/17 1516    BP (!) 155/96 (BP Location: Left Arm, Patient Position: Sitting, Cuff Size: Small)   Pulse (!) 58   Temp 97.8 F (36.6 C) (Oral)   Resp 16   Ht 5\' 6"  (1.676 m)   Wt 229 lb (103.9 kg)   SpO2 98%   BMI 36.96 kg/m       Objective:   Physical Exam  Constitutional: He is oriented to person, place, and time. He appears well-developed and well-nourished. No distress.  HENT:  Head: Normocephalic and atraumatic.  Cardiovascular: Normal rate and regular rhythm.  No murmur heard. 1-2+bilateral LE edema  Pulmonary/Chest: Effort normal and breath sounds normal. No respiratory distress. He has no wheezes. He has no rales.  Neurological: He is alert and oriented to person, place, and time.  Skin: Skin is warm and dry.  Psychiatric: He has a normal mood and affect. His behavior is normal. Thought content normal.          Assessment & Plan:  HTN- BP elevated.  Increase losartan from 25mg  to 50mg .  I suspect his uncontrolled HTN is contributing to his LE edema.  We discussed addition of a diuretic but he is hesitant due to urinary incontinence issues.   Hypothyroid- obtain follow up tsh.  Lab Results    Component Value Date   TSH 2.27 03/24/2017   Hyperlipidemia- obtain follow up lipid panel.   Gout- stable on allopurinol.   OSA- reports good compliance with cpap.    He has been working a lot lately out of town. This is really wearing him down.  He is thinking about cutting back or working more locally. I encouraged him to do this if he can.   Shingrix #1 today and flu shot.

## 2018-01-19 NOTE — Patient Instructions (Signed)
Please increase losartan from 25mg  to 50mg  once daily.   Complete lab work prior to leaving.

## 2018-01-20 ENCOUNTER — Other Ambulatory Visit: Payer: Self-pay | Admitting: Family

## 2018-01-26 ENCOUNTER — Ambulatory Visit (INDEPENDENT_AMBULATORY_CARE_PROVIDER_SITE_OTHER): Payer: Managed Care, Other (non HMO)

## 2018-01-26 ENCOUNTER — Ambulatory Visit: Payer: Self-pay

## 2018-01-26 DIAGNOSIS — J4550 Severe persistent asthma, uncomplicated: Secondary | ICD-10-CM

## 2018-02-16 ENCOUNTER — Ambulatory Visit: Payer: Managed Care, Other (non HMO) | Admitting: Family

## 2018-02-16 ENCOUNTER — Encounter: Payer: Self-pay | Admitting: Family

## 2018-02-16 VITALS — BP 134/82 | HR 68 | Temp 97.8°F | Resp 18 | Ht 66.0 in | Wt 228.8 lb

## 2018-02-16 DIAGNOSIS — I10 Essential (primary) hypertension: Secondary | ICD-10-CM

## 2018-02-16 LAB — BASIC METABOLIC PANEL
BUN: 14 mg/dL (ref 6–23)
CALCIUM: 9 mg/dL (ref 8.4–10.5)
CO2: 27 mEq/L (ref 19–32)
Chloride: 105 mEq/L (ref 96–112)
Creatinine, Ser: 1.1 mg/dL (ref 0.40–1.50)
GFR: 71.23 mL/min (ref >60.00)
Glucose, Bld: 98 mg/dL (ref 70–99)
Potassium: 4.2 mEq/L (ref 3.5–5.1)
Sodium: 138 mEq/L (ref 135–145)

## 2018-02-16 MED ORDER — LOSARTAN POTASSIUM 50 MG PO TABS: 50.0000 mg | ORAL_TABLET | Freq: Every day | ORAL | 0 refills | Status: DC

## 2018-02-16 NOTE — Progress Notes (Signed)
Subjective:    Patient ID: Michael [name], male    DOB: Sep 15, 1950, 67 y.o.   MRN: [id number]  HPI  HTN- reports that he is feeling great on increased dose of losartan.  BP Readings from Last 3 Encounters:  02/16/18 134/82  01/19/18 (!) 155/96  09/28/17 130/78     Review of Systems    see HPI  Past Medical History:  Diagnosis Date  . Allergy   . Arthritis   . Asthma   . Cancer (HCC)    basal cell and squamous cell carcinoma  . Cancer [facility])    prostate  . Clotting disorder ([place])   . Colon polyps   . GERD (gastroesophageal reflux disease)   . Gout   . High cholesterol   . History of hepatitis    due to drug interaction?  . History of pancreatitis 2016   ?due to colchicine  . History of pulmonary embolus (PE) 2016  . History of stomach ulcers   . Hypertension   . OSA (obstructive sleep apnea) 2015   has CPAP  . Sleep apnea   . Thyroid disease    hypothyroid  . Urinary incontinence      Social History   Socioeconomic History  . Marital status: Married    Spouse name: Not on file  . Number of children: Not on file  . Years of education: Not on file  . Highest education level: Not on file  Occupational History  . Not on file  Social Needs  . Financial resource strain: Not on file  . Food insecurity:    Worry: Not on file    Inability: Not on file  . Transportation needs:    Medical: Not on file    Non-medical: Not on file  Tobacco Use  . Smoking status: Never Smoker  . Smokeless tobacco: Never Used  Substance and Sexual Activity  . Alcohol use: Yes    Alcohol/week: 1.0 standard drinks    Types: 1 Glasses of wine per week  . Drug use: No  . Sexual activity: Not on file  Lifestyle  . Physical activity:    Days per week: Not on file    Minutes per session: Not on file  . Stress: Not on file  Relationships  . Social connections:    Talks on phone: Not on file    Gets together: Not on file    Attends religious service: Not on file   Active member of club or organization: Not on file    Attends meetings of clubs or organizations: Not on file    Relationship status: Not on file  . Intimate partner violence:    Fear of current or ex partner: Not on file    Emotionally abused: Not on file    Physically abused: Not on file    Forced sexual activity: Not on file  Other Topics Concern  . Not on file  Social History Narrative   Former Lawyer at [employer], now Optician, dispensing.    Married   3 daughters- all live locally   Has 7 grandchildren and 1 great grandchild   Enjoys reading, Geophysicist/field seismologist    Past Surgical History:  Procedure Laterality Date  . BASAL CELL CARCINOMA EXCISION  2017   MOHS.  beneath right eye  . CHOLECYSTECTOMY  2001  . COLONOSCOPY     multiple - last 06/2017  . PENILE PROSTHESIS IMPLANT  2016  . PROSTATECTOMY  2009  .  SINUS SURGERY WITH INSTATRAK  2014  . SQUAMOUS CELL CARCINOMA EXCISION  01/2017   nose  . TOTAL HIP ARTHROPLASTY  06/2016    Family History  Problem Relation Age of Onset  . Asthma Mother   . Arthritis Mother   . Skin cancer Mother        basal cell carcinoma  . Asthma Father   . Arthritis Father   . Diabetes Father   . Heart attack Father   . Hyperlipidemia Father   . Hypertension Father   . Kidney disease Father   . Diabetes Brother   . Breast cancer Maternal Grandmother   . Diabetes Paternal Grandfather   . Arthritis Brother   . Hyperlipidemia Brother   . Alcohol abuse Brother   . Colon cancer Neg Hx   . Esophageal cancer Neg Hx   . Liver cancer Neg Hx   . Pancreatic cancer Neg Hx   . Rectal cancer Neg Hx   . Stomach cancer Neg Hx     Allergies  Allergen Reactions  . Colchicine Other (See Comments)    Pancreatitis  . Moxifloxacin Palpitations  . Doxycycline Hives  . Statins Other (See Comments)    Joint stiffness  . Avelox [Moxifloxacin Hcl In Nacl] Other (See Comments)    SEIZURE  . Cefdinir   . Adhesive  [Tape] Other (See  Comments)    "tears skin"  . Penicillins Rash    Current Outpatient Medications on File Prior to Visit  Medication Sig Dispense Refill  . albuterol (PROVENTIL HFA;VENTOLIN HFA) 108 (90 Base) MCG/ACT inhaler Inhale into the lungs.    Marland Kitchen allopurinol (ZYLOPRIM) 100 MG tablet Take 200 mg po daily (100 mg daily for the first week only) 60 tablet 9  . beclomethasone (QVAR) 80 MCG/ACT inhaler Inhale 2 puffs into the lungs 2 (two) times daily. 8.7 g 5  . Benralizumab (FASENRA) 30 MG/ML SOSY Inject 30 mg into the skin every 30 (thirty) days.    . budesonide-formoterol (SYMBICORT) 160-4.5 MCG/ACT inhaler Inhale 2 puffs into the lungs 2 (two) times daily. Rinse gargle and spit after use 1 Inhaler 5  . Cholecalciferol (VITAMIN D3) 5000 units CAPS Take by mouth.    . docusate sodium (COLACE) 100 MG capsule Take 200 mg by mouth.    . EPINEPHrine 0.3 mg/0.3 mL IJ SOAJ injection Use as directed for severe allergic reactions 2 Device 1  . fexofenadine (ALLEGRA) 180 MG tablet Take 180 mg by mouth.    . fluticasone (FLOVENT HFA) 110 MCG/ACT inhaler Inhale 2 puffs into the lungs 2 (two) times daily. 1 Inhaler 5  . Fluticasone Propionate (XHANCE) 93 MCG/ACT EXHU Place 1 spray into the nose 2 (two) times daily. 32 mL 5  . gabapentin (NEURONTIN) 100 MG capsule Take 1 capsule (100 mg total) by mouth at bedtime. 30 capsule 3  . ipratropium-albuterol (DUONEB) 0.5-2.5 (3) MG/3ML SOLN 3 mLs.    Marland Kitchen levothyroxine (SYNTHROID, LEVOTHROID) 75 MCG tablet Take 1 tablet (75 mcg total) by mouth daily before breakfast. 30 tablet 3  . MAGNESIUM PO Take 500 mg by mouth.    . montelukast (SINGULAIR) 10 MG tablet Take 1 tablet (10 mg total) by mouth at bedtime. 90 tablet 3  . Multiple Vitamins-Minerals (OCUVITE EXTRA PO) Take by mouth.    . umeclidinium bromide (INCRUSE ELLIPTA) 62.5 MCG/INH AEPB Inhale 1 puff into the lungs daily. 30 each 1   Current Facility-Administered Medications on File Prior to Visit  Medication Dose Route  Frequency Provider Last Rate Last Dose  . 0.9 %  sodium chloride infusion  500 mL Intravenous Once Gatha Mayer, MD      . Benralizumab SOSY 30 mg  30 mg Subcutaneous Q8 Weeks Bobbitt, [place] Muta, MD   30 mg at 01/31/18 1645    BP 134/82 (BP Location: Right Arm, Cuff Size: Large)   Pulse 68   Temp 97.8 F (36.6 C) (Oral)   Resp 18   Ht 5\' 6"  (1.676 m)   Wt 228 lb 12.8 oz (103.8 kg)   SpO2 98%   BMI 36.93 kg/m    Objective:   Physical Exam  Constitutional: He is oriented to person, place, and time. He appears well-developed and well-nourished. No distress.  HENT:  Head: Normocephalic and atraumatic.  Cardiovascular: Normal rate and regular rhythm.  No murmur heard. Pulmonary/Chest: Effort normal and breath sounds normal. No respiratory distress. He has no wheezes. He has no rales.  Musculoskeletal: He exhibits no edema.  Neurological: He is alert and oriented to person, place, and time.  Skin: Skin is warm and dry.  Psychiatric: He has a normal mood and affect. His behavior is normal. Thought content normal.          Assessment & Plan:  HTN- BP is improved on current dose of losartan, obtain follow up BMET, continue current dose.

## 2018-02-16 NOTE — Patient Instructions (Signed)
Continue current dose of losartan. Complete lab work prior to leaving.

## 2018-02-19 ENCOUNTER — Other Ambulatory Visit: Payer: Self-pay | Admitting: Allergy

## 2018-02-19 MED ORDER — MONTELUKAST SODIUM 10 MG PO TABS: 10.0000 mg | ORAL_TABLET | Freq: Every day | ORAL | 5 refills | Status: DC

## 2018-02-20 NOTE — Addendum Note (Signed)
Addended by: Jiles Prows on: 02/20/2018 07:24 AM   Modules accepted: Orders

## 2018-03-30 ENCOUNTER — Ambulatory Visit (INDEPENDENT_AMBULATORY_CARE_PROVIDER_SITE_OTHER): Payer: Managed Care, Other (non HMO)

## 2018-03-30 ENCOUNTER — Ambulatory Visit: Payer: Managed Care, Other (non HMO)

## 2018-03-30 DIAGNOSIS — J4550 Severe persistent asthma, uncomplicated: Secondary | ICD-10-CM

## 2018-04-05 ENCOUNTER — Other Ambulatory Visit: Payer: Self-pay

## 2018-04-11 ENCOUNTER — Encounter: Payer: Self-pay | Admitting: *Deleted

## 2018-04-13 ENCOUNTER — Other Ambulatory Visit: Payer: Self-pay

## 2018-04-13 MED ORDER — UMECLIDINIUM BROMIDE 62.5 MCG/INH IN AEPB: 1.0000 | INHALATION_SPRAY | Freq: Every day | RESPIRATORY_TRACT | 0 refills | Status: DC

## 2018-04-13 NOTE — Telephone Encounter (Signed)
Spoke with pt about refill for Michael [name]. 1 inhaler sent to [facility] with no refills. Pt needs an OV. Office visit is scheduled

## 2018-05-04 ENCOUNTER — Ambulatory Visit (HOSPITAL_BASED_OUTPATIENT_CLINIC_OR_DEPARTMENT_OTHER)
Admission: RE | Admit: 2018-05-04 | Discharge: 2018-05-04 | Disposition: A | Discharge status: Home / Self Care | Payer: Managed Care, Other (non HMO) | Source: Ambulatory Visit | Attending: Family Medicine | Admitting: Family Medicine

## 2018-05-04 ENCOUNTER — Encounter: Payer: Self-pay | Admitting: Family Medicine

## 2018-05-04 ENCOUNTER — Ambulatory Visit: Payer: Managed Care, Other (non HMO) | Admitting: Family Medicine

## 2018-05-04 VITALS — BP 136/86 | HR 63 | Temp 98.0°F | Resp 16 | Ht 66.0 in | Wt 235.4 lb

## 2018-05-04 DIAGNOSIS — J4541 Moderate persistent asthma with (acute) exacerbation: Secondary | ICD-10-CM | POA: Diagnosis present

## 2018-05-04 MED ORDER — AZITHROMYCIN 250 MG PO TABS: ORAL_TABLET | ORAL | 1 refills | Status: DC

## 2018-05-04 MED ORDER — ALBUTEROL SULFATE (2.5 MG/3ML) 0.083% IN NEBU
2.5000 mg | INHALATION_SOLUTION | Freq: Once | RESPIRATORY_TRACT | Status: AC
  Administered 2018-05-04: 2.5 mg via RESPIRATORY_TRACT

## 2018-05-04 MED ORDER — METHYLPREDNISOLONE ACETATE 80 MG/ML IJ SUSP
80.0000 mg | Freq: Once | INTRAMUSCULAR | Status: AC
  Administered 2018-05-04: 80 mg via INTRAMUSCULAR

## 2018-05-04 MED ORDER — PREDNISONE 10 MG PO TABS: ORAL_TABLET | ORAL | 0 refills | Status: DC

## 2018-05-04 NOTE — Patient Instructions (Signed)

## 2018-05-04 NOTE — Addendum Note (Signed)
Addended by: Kem Boroughs D on: 05/04/2018 05:13 PM   Modules accepted: Orders

## 2018-05-04 NOTE — Progress Notes (Signed)
+ Patient ID: Michael [name], male    DOB: 1950/11/18  Age: 68 y.o. MRN: [id number]    Subjective:  Subjective  HPI Michael [name] presents for sinus congestion , cough since dec ---  Pt took mucinex and decongestant.  Cough is worse at night.  + sinus pressure ,     Review of Systems  Constitutional: Positive for chills and fever.  HENT: Positive for congestion, postnasal drip, rhinorrhea and sinus pressure.   Respiratory: Positive for cough, chest tightness, shortness of breath and wheezing.   Cardiovascular: Negative for chest pain, palpitations and leg swelling.  Allergic/Immunologic: Negative for environmental allergies.    History Past Medical History:  Diagnosis Date  . Allergy   . Arthritis   . Asthma   . Cancer (HCC)    basal cell and squamous cell carcinoma  . Cancer [facility])    prostate  . Clotting disorder ([place])   . Colon polyps   . GERD (gastroesophageal reflux disease)   . Gout   . High cholesterol   . History of hepatitis    due to drug interaction?  . History of pancreatitis 2016   ?due to colchicine  . History of pulmonary embolus (PE) 2016  . History of stomach ulcers   . Hypertension   . OSA (obstructive sleep apnea) 2015   has CPAP  . Sleep apnea   . Thyroid disease    hypothyroid  . Urinary incontinence     He has a past surgical history that includes Prostatectomy (2009); Total hip arthroplasty (06/2016); Sinus surgery with Instatrak (2014); Penile prosthesis implant (2016); Cholecystectomy (2001); Excision basal cell carcinoma (2017); Squamous cell carcinoma excision (01/2017); and Colonoscopy.   His family history includes Alcohol abuse in his brother; Arthritis in his brother, father, and mother; Asthma in his father and mother; Breast cancer in his maternal grandmother; Diabetes in his brother, father, and paternal grandfather; Heart attack in his father; Hyperlipidemia in his brother and father; Hypertension in his father; Kidney  disease in his father; Skin cancer in his mother.He reports that he has never smoked. He has never used smokeless tobacco. He reports current alcohol use of about 1.0 standard drinks of alcohol per week. He reports that he does not use drugs.  Current Outpatient Medications on File Prior to Visit  Medication Sig Dispense Refill  . albuterol (PROVENTIL HFA;VENTOLIN HFA) 108 (90 Base) MCG/ACT inhaler Inhale into the lungs.    Marland Kitchen allopurinol (ZYLOPRIM) 100 MG tablet Take 200 mg po daily (100 mg daily for the first week only) 60 tablet 9  . Benralizumab (FASENRA) 30 MG/ML SOSY Inject 30 mg into the skin every 30 (thirty) days.    . budesonide-formoterol (SYMBICORT) 160-4.5 MCG/ACT inhaler Inhale 2 puffs into the lungs 2 (two) times daily. Rinse gargle and spit after use 1 Inhaler 5  . Cholecalciferol (VITAMIN D3) 5000 units CAPS Take by mouth.    . docusate sodium (COLACE) 100 MG capsule Take 200 mg by mouth.    . EPINEPHrine 0.3 mg/0.3 mL IJ SOAJ injection Use as directed for severe allergic reactions 2 Device 1  . fexofenadine (ALLEGRA) 180 MG tablet Take 180 mg by mouth.    . fluticasone (FLOVENT HFA) 110 MCG/ACT inhaler Inhale 2 puffs into the lungs 2 (two) times daily. 1 Inhaler 5  . Fluticasone Propionate (XHANCE) 93 MCG/ACT EXHU Place 1 spray into the nose 2 (two) times daily. 32 mL 5  . gabapentin (NEURONTIN) 100 MG capsule Take 1  capsule (100 mg total) by mouth at bedtime. 30 capsule 3  . ipratropium-albuterol (DUONEB) 0.5-2.5 (3) MG/3ML SOLN 3 mLs.    Marland Kitchen levothyroxine (SYNTHROID, LEVOTHROID) 75 MCG tablet Take 1 tablet (75 mcg total) by mouth daily before breakfast. 30 tablet 3  . losartan (COZAAR) 50 MG tablet Take 1 tablet (50 mg total) by mouth daily. 90 tablet 0  . MAGNESIUM PO Take 500 mg by mouth.    . montelukast (SINGULAIR) 10 MG tablet Take 1 tablet (10 mg total) by mouth at bedtime. 90 tablet 3  . Multiple Vitamins-Minerals (OCUVITE EXTRA PO) Take by mouth.    . umeclidinium  bromide (INCRUSE ELLIPTA) 62.5 MCG/INH AEPB Inhale 1 puff into the lungs daily. 30 each 0   Current Facility-Administered Medications on File Prior to Visit  Medication Dose Route Frequency Provider Last Rate Last Dose  . 0.9 %  sodium chloride infusion  500 mL Intravenous Once Gatha Mayer, MD      . Benralizumab SOSY 30 mg  30 mg Subcutaneous Q8 Weeks Bobbitt, [place] Muta, MD   30 mg at 03/30/18 3762     Objective:  Objective  Physical Exam Vitals signs and nursing note reviewed.  Constitutional:      Appearance: He is well-developed.  HENT:     Right Ear: External ear normal.     Left Ear: External ear normal.  Eyes:     General:        Right eye: No discharge.        Left eye: No discharge.     Conjunctiva/sclera: Conjunctivae normal.  Cardiovascular:     Rate and Rhythm: Normal rate and regular rhythm.     Heart sounds: Normal heart sounds. No murmur.  Pulmonary:     Effort: Pulmonary effort is normal. No respiratory distress.     Breath sounds: Decreased air movement present. Wheezing present. No rales.  Chest:     Chest wall: No tenderness.  Lymphadenopathy:     Cervical: Cervical adenopathy present.  Neurological:     Mental Status: He is alert and oriented to person, place, and time.    BP 136/86 (BP Location: Left Arm, Cuff Size: Normal)   Pulse 63   Temp 98 F (36.7 C) (Oral)   Resp 16   Ht 5\' 6"  (1.676 m)   Wt 235 lb 6.4 oz (106.8 kg)   SpO2 98%   BMI 37.99 kg/m  Wt Readings from Last 3 Encounters:  05/04/18 235 lb 6.4 oz (106.8 kg)  02/16/18 228 lb 12.8 oz (103.8 kg)  01/19/18 229 lb (103.9 kg)     Lab Results  Component Value Date   WBC 6.6 08/16/2017   HGB 15.7 08/16/2017   HCT 45.7 08/16/2017   PLT 257.0 08/16/2017   GLUCOSE 98 02/16/2018   CHOL 173 01/19/2018   TRIG 168.0 (H) 01/19/2018   HDL 38.10 (L) 01/19/2018   LDLCALC 102 (H) 01/19/2018   ALT 15 03/24/2017   AST 12 03/24/2017   NA 138 02/16/2018   K 4.2 02/16/2018   CL 105  02/16/2018   CREATININE 1.10 02/16/2018   BUN 14 02/16/2018   CO2 27 02/16/2018   TSH 1.63 01/19/2018    Dg Knee Complete 4 Views Left  Result Date: 08/17/2017 CLINICAL DATA:  Medial left knee pain for 3 weeks EXAM: LEFT KNEE - COMPLETE 4+ VIEW COMPARISON:  None. FINDINGS: Slight medial femorotibial joint space narrowing without fracture. Joint space narrowing of the patellofemoral compartment  with subchondral cystic change likely representing chondral thinning and/or fissuring. Small joint effusion. No malalignment or suspicious osseous lesions. IMPRESSION: Mild degenerative joint space narrowing of the medial femorotibial compartment. 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 Small suprapatellar joint effusion. Electronically Signed   By: Ashley Royalty M.D.   On: 08/17/2017 03:37     Assessment & Plan:  Plan  I have discontinued Michael [name]. Michael "Martin"'s beclomethasone. I am also having him start on azithromycin and predniSONE. Additionally, I am having him maintain his albuterol, Vitamin D3, docusate sodium, fexofenadine, ipratropium-albuterol, MAGNESIUM PO, Multiple Vitamins-Minerals (OCUVITE EXTRA PO), montelukast, Fluticasone Propionate, fluticasone, EPINEPHrine, Benralizumab, allopurinol, budesonide-formoterol, gabapentin, levothyroxine, losartan, and umeclidinium bromide. We administered albuterol and methylPREDNISolone acetate. We will continue to administer sodium chloride and Benralizumab.  Meds ordered this encounter  Medications  . azithromycin (ZITHROMAX Z-PAK) 250 MG tablet    Sig: As directed    Dispense:  6 each    Refill:  1  . predniSONE (DELTASONE) 10 MG tablet    Sig: TAKE 3 TABLETS PO QD FOR 3 DAYS THEN TAKE 2 TABLETS PO QD FOR 3 DAYS THEN TAKE 1 TABLET PO QD FOR 3 DAYS THEN TAKE 1/2 TAB PO QD FOR 3 DAYS    Dispense:  20 tablet    Refill:  0  . albuterol (PROVENTIL) (2.5 MG/3ML) 0.083% nebulizer solution 2.5  mg  . methylPREDNISolone acetate (DEPO-MEDROL) injection 80 mg    Problem List Items Addressed This Visit    None    Visit Diagnoses    Moderate persistent asthmatic bronchitis with acute exacerbation    -  Primary   Relevant Medications   azithromycin (ZITHROMAX Z-PAK) 250 MG tablet   predniSONE (DELTASONE) 10 MG tablet   albuterol (PROVENTIL) (2.5 MG/3ML) 0.083% nebulizer solution 2.5 mg (Completed)   methylPREDNISolone acetate (DEPO-MEDROL) injection 80 mg (Completed)   Other Relevant Orders   DG Chest 2 View    can use robitussin for cough or mucinex  Take abx per orders  Check cxr  Go to er over weekend   Follow-up: Return in about 2 weeks (around 05/18/2018), or if symptoms worsen or fail to improve, for f/u pcp for bronchitis.  Ann Held, DO

## 2018-05-07 ENCOUNTER — Other Ambulatory Visit: Payer: Self-pay | Admitting: Family

## 2018-05-10 ENCOUNTER — Ambulatory Visit: Payer: Managed Care, Other (non HMO) | Admitting: Family Medicine

## 2018-05-10 ENCOUNTER — Other Ambulatory Visit: Payer: Self-pay | Admitting: Allergy and Immunology

## 2018-05-10 ENCOUNTER — Other Ambulatory Visit: Payer: Self-pay | Admitting: Family Medicine

## 2018-05-10 ENCOUNTER — Other Ambulatory Visit: Payer: Self-pay | Admitting: Family

## 2018-05-10 DIAGNOSIS — M109 Gout, unspecified: Secondary | ICD-10-CM

## 2018-05-11 ENCOUNTER — Encounter: Payer: Self-pay | Admitting: Allergy

## 2018-05-11 ENCOUNTER — Ambulatory Visit (INDEPENDENT_AMBULATORY_CARE_PROVIDER_SITE_OTHER): Payer: Managed Care, Other (non HMO) | Admitting: Allergy

## 2018-05-11 ENCOUNTER — Ambulatory Visit: Payer: Managed Care, Other (non HMO) | Admitting: Allergy

## 2018-05-11 VITALS — BP 134/80 | HR 67 | Temp 97.9°F | Ht 66.5 in | Wt 227.4 lb

## 2018-05-11 DIAGNOSIS — Z8709 Personal history of other diseases of the respiratory system: Secondary | ICD-10-CM | POA: Diagnosis not present

## 2018-05-11 DIAGNOSIS — Z888 Allergy status to other drugs, medicaments and biological substances status: Secondary | ICD-10-CM | POA: Diagnosis not present

## 2018-05-11 DIAGNOSIS — J0100 Acute maxillary sinusitis, unspecified: Secondary | ICD-10-CM

## 2018-05-11 DIAGNOSIS — J3089 Other allergic rhinitis: Secondary | ICD-10-CM

## 2018-05-11 DIAGNOSIS — J4551 Severe persistent asthma with (acute) exacerbation: Secondary | ICD-10-CM

## 2018-05-11 DIAGNOSIS — Z886 Allergy status to analgesic agent status: Secondary | ICD-10-CM

## 2018-05-11 MED ORDER — FLUTICASONE PROPIONATE HFA 110 MCG/ACT IN AERO: 2.0000 | INHALATION_SPRAY | Freq: Two times a day (BID) | RESPIRATORY_TRACT | 5 refills | Status: DC

## 2018-05-11 MED ORDER — SULFAMETHOXAZOLE-TRIMETHOPRIM 800-160 MG PO TABS: ORAL_TABLET | ORAL | 0 refills | Status: DC

## 2018-05-11 MED ORDER — IPRATROPIUM-ALBUTEROL 0.5-2.5 (3) MG/3ML IN SOLN: RESPIRATORY_TRACT | 1 refills | Status: DC

## 2018-05-11 NOTE — Assessment & Plan Note (Signed)
No recent allergy testing. Consider retesting in future. See assessment and plan as above.

## 2018-05-11 NOTE — Patient Instructions (Addendum)
   Use duoneb every 4-6 hours while awake for the next 3-5 days - send in script.   Start Flovent 110 2 puffs twice a day with spacer and rinse mouth afterwards for 1-2 weeks.  Start prednisone taper.  40mg  x 3 days, 30mg  x 3 days, 20mg  x 2 days, 10mg  x 2 days.  Start bactrim DS antibiotic 1 pill twice a day for 10 days. Take with food. Take probiotics with this.    Continue Fasenra injections. . Daily controller medication(s): Symbicort 160 2 puffs twice a day, Spiriva 2 puffs daily, Singulair daily . Prior to physical activity: May use albuterol rescue inhaler 2 puffs 5 to 15 minutes prior to strenuous physical activities. Marland Kitchen Rescue medications: May use albuterol rescue inhaler 2 puffs or nebulizer every 4 to 6 hours as needed for shortness of breath, chest tightness, coughing, and wheezing. Monitor frequency of use.  . During upper respiratory infections: Start Flovent 110 2 puffs twice a day with spacer and rinse mouth afterwards for 1-2 weeks. . Asthma control goals:  o Full participation in all desired activities (may need albuterol before activity) o Albuterol use two times or less a week on average (not counting use with activity) o Cough interfering with sleep two times or less a month o Oral steroids no more than once a year o No hospitalizations  Continue Nasacort 2 spray twice a day. Nasal saline spray (i.e., Simply Saline) or nasal saline lavage (i.e., NeilMed) is recommended as needed and prior to medicated nasal sprays.  Take Mucinex 600mg -1200mg  twice a day for thick discharge and take with plenty of water. Avoid aspirin, Advil and NSAIDs.  Follow up in 6 months   Drink plenty of fluids.  Water, juice, clear broth or warm lemon water are good choices. Avoid caffeine and alcohol, which can dehydrate you.  Eat chicken soup.  Chicken soup and other warm fluids can be soothing and loosen congestion.  Rest.  Adjust your room's temperature and humidity.  Keep your  room warm but not overheated. If the air is dry, a cool-mist humidifier or vaporizer can moisten the air and help ease congestion and coughing. Keep the humidifier clean to prevent the growth of bacteria and molds.  Soothe your throat.  Perform a saltwater gargle. Dissolve one-quarter to a half teaspoon of salt in a 4- to 8-ounce glass of warm water. This can relieve a sore or scratchy throat temporarily.

## 2018-05-11 NOTE — Assessment & Plan Note (Signed)
Continue to avoid NSAID products.

## 2018-05-11 NOTE — Assessment & Plan Note (Addendum)
Most likely has acute sinus infection and zpak did not work. Patient is allergic to various antibiotics.  Continue Nasacort 2 spray twice a day.  Nasal saline spray (i.e., Simply Saline) or nasal saline lavage (i.e., NeilMed) is recommended as needed and prior to medicated nasal sprays.  Take Mucinex 600mg -1200mg  twice a day for thick discharge and take with plenty of water.  Start bactrim DS antibiotic 1 pill twice a day for 10 days.

## 2018-05-11 NOTE — Assessment & Plan Note (Signed)
Asthma exacerbated by URI and not improving despite IM depo, zpak and prednisone treatment. Previously had no issues with below regimen and has been doing well with the Fasenra injections.  Today's spirometry showed: severe restriction and moderate obstruction with 11% improvement in FEV1 post bronchodilator treatment.   Use duoneb every 4-6 hours while awake for the next 3-5 days - sent in new script as his medications were expired and most likely not as effective.   Start Flovent 110 2 puffs twice a day with spacer and rinse mouth afterwards for 1-2 weeks.  Start prednisone taper.  40mg  x 3 days, 30mg  x 3 days, 20mg  x 2 days, 10mg  x 2 days.  Start bactrim DS antibiotic 1 pill twice a day for 10 days. Take with food. Take probiotics with this.   Continue Fasenra injections. . Daily controller medication(s): Symbicort 160 2 puffs twice a day, Spiriva 2 puffs daily, Singulair daily . Prior to physical activity: May use albuterol rescue inhaler 2 puffs 5 to 15 minutes prior to strenuous physical activities. Marland Kitchen Rescue medications: May use albuterol rescue inhaler 2 puffs or nebulizer every 4 to 6 hours as needed for shortness of breath, chest tightness, coughing, and wheezing. Monitor frequency of use.  . During upper respiratory infections: Start Flovent 110 2 puffs twice a day with spacer and rinse mouth afterwards for 1-2 weeks.

## 2018-05-11 NOTE — Assessment & Plan Note (Addendum)
Continue Nasacort 2 sprays twice a day.

## 2018-05-11 NOTE — Progress Notes (Signed)
Follow Up Note  RE: Michael [name] MRN: [id number] DOB: [birth year] Date of Office Visit: 05/11/2018  Referring provider: Debbrah Alar, NP Primary care provider: Debbrah Alar, NP  Chief Complaint: Asthma (renewin fasenra, bronchitis for 2 weeks, chest xray last friday with pcp)  History of Present Illness: I had the pleasure of seeing Michael [name] for a follow up visit at the [facility] on 05/11/2018. Michael [name] is a 68 y.o. [name], who is being followed for asthma, allergic rhinitis, h/o nasal polyps and aspirin sensitivity. Today Michael [name] is here for follow up visit and new complaint of worsening asthma symptoms. His previous allergy office visit was on 06/08/2017 with Dr. Verlin Fester.   Asthma with acute exacerbation Currently on Fasenra every 8 weeks for about 1 year with good benefit and working better than Xolair. Still taking Symbicort 180 2 puffs BID, Spiriva 2 puffs daily and Singulair daily and was doing well for over 1 year.   However during Christmas Michael [name] was exposed to bronchitis by his coworker. Went to see PCP on 1/10 and was treated with IM depo, nebulizer treatment, zpak and oral prednisone with no benefit. Patient had some fevers with this but now the fevers are gone. Up to date with flu vaccine.   Using rescue inhaler 3 times a day and nebulizer nightly due to coughing, wheezing, chest tightness and SOB. Did not add Flovent to his regimen. The duoneb may have been expired.  CXR on 05/04/2018 was normal. Michael [name] is avoiding NSAIDs and taking Tylenol for pain.   Acute sinusitis Having nasal drainage with significant discoloration. Using Nasacort 2 sprays BID and rinsing BID. Taking Mucinex 600mg  twice a day. The zpak did not help and Michael [name] is allergic to multiple antibiotics. No issues with bactrim in the past.   Assessment and Plan: Michael [name] is a 68 y.o. [name] with: Asthma with acute exacerbation Asthma exacerbated by URI and not improving despite IM depo,  zpak and prednisone treatment. Previously had no issues with below regimen and has been doing well with the Fasenra injections.  Today's spirometry showed: severe restriction and moderate obstruction with 11% improvement in FEV1 post bronchodilator treatment.   Use duoneb every 4-6 hours while awake for the next 3-5 days - sent in new script as his medications were expired and most likely not as effective.   Start Flovent 110 2 puffs twice a day with spacer and rinse mouth afterwards for 1-2 weeks.  Start prednisone taper.  40mg  x 3 days, 30mg  x 3 days, 20mg  x 2 days, 10mg  x 2 days.  Start bactrim DS antibiotic 1 pill twice a day for 10 days. Take with food. Take probiotics with this.   Continue Fasenra injections. . Daily controller medication(s): Symbicort 160 2 puffs twice a day, Spiriva 2 puffs daily, Singulair daily . Prior to physical activity: May use albuterol rescue inhaler 2 puffs 5 to 15 minutes prior to strenuous physical activities. Marland Kitchen Rescue medications: May use albuterol rescue inhaler 2 puffs or nebulizer every 4 to 6 hours as needed for shortness of breath, chest tightness, coughing, and wheezing. Monitor frequency of use.  . During upper respiratory infections: Start Flovent 110 2 puffs twice a day with spacer and rinse mouth afterwards for 1-2 weeks.  Acute maxillary sinusitis Most likely has acute sinus infection and zpak did not work. Patient is allergic to various antibiotics.  Continue Nasacort 2 spray twice a day.  Nasal saline spray (i.e., Simply Saline) or nasal saline  lavage (i.e., NeilMed) is recommended as needed and prior to medicated nasal sprays.  Take Mucinex 600mg -1200mg  twice a day for thick discharge and take with plenty of water.  Start bactrim DS antibiotic 1 pill twice a day for 10 days.  Aspirin sensitivity Continue to avoid NSAID products.  History of nasal polyp Continue Nasacort 2 sprays twice a day.   Allergic rhinitis No recent allergy  testing. Consider retesting in future. See assessment and plan as above.   Return in about 6 months (around 11/09/2018).  Meds ordered this encounter  Medications  . ipratropium-albuterol (DUONEB) 0.5-2.5 (3) MG/3ML SOLN    Sig: 1 vial in nebulizer every 4-6 hours while awake for the next 3-5 days for coughing or wheezing.    Dispense:  360 mL    Refill:  1  . sulfamethoxazole-trimethoprim (BACTRIM DS) 800-160 MG tablet    Sig: 1 tablet twice a day for 10 days take with foods make sure to take probiotic along with it    Dispense:  20 tablet    Refill:  0  . fluticasone (FLOVENT HFA) 110 MCG/ACT inhaler    Sig: Inhale 2 puffs into the lungs 2 (two) times daily.    Dispense:  1 Inhaler    Refill:  5   Diagnostics: Spirometry:  Tracings reviewed. His effort: It was hard to get consistent efforts and there is a question as to whether this reflects a maximal maneuver. FVC: 1.95L FEV1: 1.38L, 46% predicted FEV1/FVC ratio: 71% Interpretation: severe restriction and moderate obstruction with 11% improvement in FEV1 post bronchodilator treatment.  Please see scanned spirometry results for details.  Medication List:  Current Outpatient Medications  Medication Sig Dispense Refill  . albuterol (PROVENTIL HFA;VENTOLIN HFA) 108 (90 Base) MCG/ACT inhaler Inhale into the lungs.    Marland Kitchen allopurinol (ZYLOPRIM) 100 MG tablet TAKE 1 TABLET BY MOUTH DAILY X 1 WEEK THEN 2 TABLETS BY MOUTH DAILY 120 tablet 5  . Benralizumab (FASENRA) 30 MG/ML SOSY Inject 30 mg into the skin every 30 (thirty) days.    . budesonide-formoterol (SYMBICORT) 160-4.5 MCG/ACT inhaler Inhale 2 puffs into the lungs 2 (two) times daily. Rinse gargle and spit after use 1 Inhaler 5  . Cholecalciferol (VITAMIN D3) 5000 units CAPS Take by mouth.    . docusate sodium (COLACE) 100 MG capsule Take 200 mg by mouth.    . EPINEPHrine 0.3 mg/0.3 mL IJ SOAJ injection Use as directed for severe allergic reactions 2 Device 1  . fexofenadine  (ALLEGRA) 180 MG tablet Take 180 mg by mouth.    . fluticasone (FLOVENT HFA) 110 MCG/ACT inhaler Inhale 2 puffs into the lungs 2 (two) times daily. 1 Inhaler 5  . Fluticasone Propionate (XHANCE) 93 MCG/ACT EXHU Place 1 spray into the nose 2 (two) times daily. 32 mL 5  . gabapentin (NEURONTIN) 100 MG capsule TAKE 1 CAPSULE(100 MG) BY MOUTH AT BEDTIME 30 capsule 3  . ipratropium-albuterol (DUONEB) 0.5-2.5 (3) MG/3ML SOLN 3 mLs.    Marland Kitchen levothyroxine (SYNTHROID, LEVOTHROID) 75 MCG tablet TAKE 1 TABLET(75 MCG) BY MOUTH DAILY BEFORE BREAKFAST 30 tablet 5  . losartan (COZAAR) 50 MG tablet TAKE 1 TABLET(50 MG) BY MOUTH DAILY 90 tablet 0  . MAGNESIUM PO Take 500 mg by mouth.    . montelukast (SINGULAIR) 10 MG tablet Take 1 tablet (10 mg total) by mouth at bedtime. 90 tablet 3  . Multiple Vitamins-Minerals (OCUVITE EXTRA PO) Take by mouth.    . predniSONE (DELTASONE) 10 MG tablet TAKE  3 TABLETS PO QD FOR 3 DAYS THEN TAKE 2 TABLETS PO QD FOR 3 DAYS THEN TAKE 1 TABLET PO QD FOR 3 DAYS THEN TAKE 1/2 TAB PO QD FOR 3 DAYS 20 tablet 0  . umeclidinium bromide (INCRUSE ELLIPTA) 62.5 MCG/INH AEPB Inhale 1 puff into the lungs daily. 30 each 0  . azithromycin (ZITHROMAX Z-PAK) 250 MG tablet As directed (Patient not taking: Reported on 05/11/2018) 6 each 1  . fluticasone (FLOVENT HFA) 110 MCG/ACT inhaler Inhale 2 puffs into the lungs 2 (two) times daily. 1 Inhaler 5  . ipratropium-albuterol (DUONEB) 0.5-2.5 (3) MG/3ML SOLN 1 vial in nebulizer every 4-6 hours while awake for the next 3-5 days for coughing or wheezing. 360 mL 1  . sulfamethoxazole-trimethoprim (BACTRIM DS) 800-160 MG tablet 1 tablet twice a day for 10 days take with foods make sure to take probiotic along with it 20 tablet 0   Current Facility-Administered Medications  Medication Dose Route Frequency Provider Last Rate Last Dose  . 0.9 %  sodium chloride infusion  500 mL Intravenous Once Gatha Mayer, MD      . Benralizumab SOSY 30 mg  30 mg  Subcutaneous Q8 Weeks Bobbitt, [place] Muta, MD   30 mg at 03/30/18 0272   Allergies: Allergies  Allergen Reactions  . Colchicine Other (See Comments)    Pancreatitis  . Moxifloxacin Palpitations  . Doxycycline Hives  . Statins Other (See Comments)    Joint stiffness  . Avelox [Moxifloxacin Hcl In Nacl] Other (See Comments)    SEIZURE  . Cefdinir   . Adhesive  [Tape] Other (See Comments)    "tears skin"  . Penicillins Rash   I reviewed his past medical history, social history, family history, and environmental history and no significant changes have been reported from previous visit on 06/08/2017.  Review of Systems  Constitutional: Negative for appetite change, chills, fever and unexpected weight change.  HENT: Positive for congestion, postnasal drip, rhinorrhea and sinus pressure.   Eyes: Negative for itching.  Respiratory: Positive for cough, chest tightness, shortness of breath and wheezing.   Gastrointestinal: Negative for abdominal pain.  Skin: Negative for rash.  Allergic/Immunologic: Positive for environmental allergies.  Neurological: Negative for headaches.   Objective: BP 134/80   Pulse 67   Temp 97.9 F (36.6 C) (Oral)   Ht 5' 6.5" (1.689 m)   Wt 227 lb 6.4 oz (103.1 kg)   SpO2 94%   BMI 36.15 kg/m  Body mass index is 36.15 kg/m. Physical Exam  Constitutional: Michael [name] is oriented to person, place, and time. Michael [name] appears well-developed and well-nourished.  HENT:  Head: Normocephalic and atraumatic.  Right Ear: External ear normal.  Left Ear: External ear normal.  Nose: Nose normal.  Mouth/Throat: Oropharynx is clear and moist.  Eyes: Conjunctivae and EOM are normal.  Neck: Neck supple.  Cardiovascular: Normal rate, regular rhythm and normal heart sounds. Exam reveals no gallop and no friction rub.  No murmur heard. Pulmonary/Chest: Effort normal. Michael [name] has wheezes (resolved after nebulizer treatment). Michael [name] has no rales.  Neurological: Michael [name] is alert and oriented to  person, place, and time.  Skin: Skin is warm. No rash noted.  Psychiatric: Michael [name] has a normal mood and affect. His behavior is normal.  Nursing note and vitals reviewed.  Previous notes and tests were reviewed. The plan was reviewed with the patient/family, and all questions/concerned were addressed.  It was my pleasure to see Michael [name] today and participate in his care. Please feel free  to contact me with any questions or concerns.  Sincerely,  Rexene Alberts, DO Allergy & Immunology  [facility] office: [phone] [facility] office: [phone]

## 2018-05-18 ENCOUNTER — Telehealth: Payer: Self-pay | Admitting: *Deleted

## 2018-05-18 ENCOUNTER — Other Ambulatory Visit: Payer: Self-pay | Admitting: *Deleted

## 2018-05-18 MED ORDER — BENRALIZUMAB 30 MG/ML ~~LOC~~ SOSY: 30.0000 mg | PREFILLED_SYRINGE | SUBCUTANEOUS | 6 refills | Status: DC

## 2018-05-18 MED ORDER — ALBUTEROL SULFATE HFA 108 (90 BASE) MCG/ACT IN AERS: 2.0000 | INHALATION_SPRAY | Freq: Four times a day (QID) | RESPIRATORY_TRACT | 1 refills | Status: DC | PRN

## 2018-05-18 NOTE — Telephone Encounter (Signed)
Spoke with patient.  He is aware the refill has been sent in.

## 2018-05-18 NOTE — Telephone Encounter (Signed)
Patient called requested a rescue inhaler to be sent to [facility] on [address]

## 2018-05-21 ENCOUNTER — Encounter: Payer: Self-pay | Admitting: Family

## 2018-05-24 ENCOUNTER — Ambulatory Visit: Payer: Self-pay

## 2018-05-25 ENCOUNTER — Encounter: Payer: Self-pay | Admitting: Family

## 2018-05-25 ENCOUNTER — Ambulatory Visit: Payer: Managed Care, Other (non HMO) | Admitting: Family

## 2018-05-25 ENCOUNTER — Ambulatory Visit: Payer: Managed Care, Other (non HMO)

## 2018-05-25 VITALS — BP 118/70 | HR 78 | Temp 97.5°F | Resp 16 | Ht 67.0 in | Wt 225.0 lb

## 2018-05-25 DIAGNOSIS — I10 Essential (primary) hypertension: Secondary | ICD-10-CM

## 2018-05-25 DIAGNOSIS — J4551 Severe persistent asthma with (acute) exacerbation: Secondary | ICD-10-CM

## 2018-05-25 DIAGNOSIS — E039 Hypothyroidism, unspecified: Secondary | ICD-10-CM | POA: Diagnosis not present

## 2018-05-25 LAB — BASIC METABOLIC PANEL
BUN: 16 mg/dL (ref 6–23)
CO2: 26 mEq/L (ref 19–32)
Calcium: 8.8 mg/dL (ref 8.4–10.5)
Chloride: 104 mEq/L (ref 96–112)
Creatinine, Ser: 1.06 mg/dL (ref 0.40–1.50)
GFR: 69.89 mL/min (ref >60.00)
Glucose, Bld: 108 mg/dL — ABNORMAL HIGH (ref 70–99)
Potassium: 5.1 mEq/L (ref 3.5–5.1)
Sodium: 138 mEq/L (ref 135–145)

## 2018-05-25 LAB — TSH: TSH: 2.66 u[IU]/mL (ref 0.35–4.50)

## 2018-05-25 NOTE — Progress Notes (Signed)
Subjective:    Patient ID: Michael [name], male    DOB: 02/25/1951, 68 y.o.   MRN: [id number]  HPI  Patient is a 68 yr old male who presents today for follow up.  HTN- maintained on losartan.  BP Readings from Last 3 Encounters:  05/25/18 118/70  05/11/18 134/80  05/04/18 136/86   He was treated for Bronchitis/acute asthma exacerbation on 05/04/18. He is maintained on his maintenance asthma medications including singulair, fasenra. He is taking qvar and not symbicort.  Reports that after initial treatment symptoms worsened. He saw his allergist and was placed back on steroid and also bactrim. Notes that it took 10 days on this regimen before he felt better but things are finally starting to improve.   Gout- maintained on allopurinol.  Reports that he has had a small problem in left foot x 1 week. Declines any further rx for gout at this time.   Hypothyroid- maintained on synthroid 75 mcg.   Lab Results  Component Value Date   TSH 1.63 01/19/2018    Review of Systems See HPI  Past Medical History:  Diagnosis Date  . Allergy   . Arthritis   . Asthma   . Cancer (HCC)    basal cell and squamous cell carcinoma  . Cancer [facility])    prostate  . Clotting disorder ([place])   . Colon polyps   . GERD (gastroesophageal reflux disease)   . Gout   . High cholesterol   . History of hepatitis    due to drug interaction?  . History of pancreatitis 2016   ?due to colchicine  . History of pulmonary embolus (PE) 2016  . History of stomach ulcers   . Hypertension   . OSA (obstructive sleep apnea) 2015   has CPAP  . Sleep apnea   . Thyroid disease    hypothyroid  . Urinary incontinence      Social History   Socioeconomic History  . Marital status: Married    Spouse name: Not on file  . Number of children: Not on file  . Years of education: Not on file  . Highest education level: Not on file  Occupational History  . Not on file  Social Needs  . Financial resource  strain: Not on file  . Food insecurity:    Worry: Not on file    Inability: Not on file  . Transportation needs:    Medical: Not on file    Non-medical: Not on file  Tobacco Use  . Smoking status: Never Smoker  . Smokeless tobacco: Never Used  Substance and Sexual Activity  . Alcohol use: Yes    Alcohol/week: 1.0 standard drinks    Types: 1 Glasses of wine per week  . Drug use: No  . Sexual activity: Not on file  Lifestyle  . Physical activity:    Days per week: Not on file    Minutes per session: Not on file  . Stress: Not on file  Relationships  . Social connections:    Talks on phone: Not on file    Gets together: Not on file    Attends religious service: Not on file    Active member of club or organization: Not on file    Attends meetings of clubs or organizations: Not on file    Relationship status: Not on file  . Intimate partner violence:    Fear of current or ex partner: Not on file    Emotionally abused: Not  on file    Physically abused: Not on file    Forced sexual activity: Not on file  Other Topics Concern  . Not on file  Social History Narrative   Former Lawyer at [employer], now Optician, dispensing.    Married   3 daughters- all live locally   Has 7 grandchildren and 1 great grandchild   Enjoys reading, Geophysicist/field seismologist    Past Surgical History:  Procedure Laterality Date  . BASAL CELL CARCINOMA EXCISION  2017   MOHS.  beneath right eye  . CHOLECYSTECTOMY  2001  . COLONOSCOPY     multiple - last 06/2017  . PENILE PROSTHESIS IMPLANT  2016  . PROSTATECTOMY  2009  . SINUS SURGERY WITH INSTATRAK  2014  . SQUAMOUS CELL CARCINOMA EXCISION  01/2017   nose  . TOTAL HIP ARTHROPLASTY  06/2016    Family History  Problem Relation Age of Onset  . Asthma Mother   . Arthritis Mother   . Skin cancer Mother        basal cell carcinoma  . Asthma Father   . Arthritis Father   . Diabetes Father   . Heart attack Father   . Hyperlipidemia  Father   . Hypertension Father   . Kidney disease Father   . Diabetes Brother   . Breast cancer Maternal Grandmother   . Diabetes Paternal Grandfather   . Arthritis Brother   . Hyperlipidemia Brother   . Alcohol abuse Brother   . Colon cancer Neg Hx   . Esophageal cancer Neg Hx   . Liver cancer Neg Hx   . Pancreatic cancer Neg Hx   . Rectal cancer Neg Hx   . Stomach cancer Neg Hx     Allergies  Allergen Reactions  . Colchicine Other (See Comments)    Pancreatitis  . Moxifloxacin Palpitations  . Doxycycline Hives  . Statins Other (See Comments)    Joint stiffness  . Avelox [Moxifloxacin Hcl In Nacl] Other (See Comments)    SEIZURE  . Cefdinir   . Adhesive  [Tape] Other (See Comments)    "tears skin"  . Penicillins Rash    Current Outpatient Medications on File Prior to Visit  Medication Sig Dispense Refill  . albuterol (PROVENTIL HFA;VENTOLIN HFA) 108 (90 Base) MCG/ACT inhaler Inhale 2 puffs into the lungs every 6 (six) hours as needed for wheezing or shortness of breath. 1 Inhaler 1  . allopurinol (ZYLOPRIM) 100 MG tablet TAKE 1 TABLET BY MOUTH DAILY X 1 WEEK THEN 2 TABLETS BY MOUTH DAILY 120 tablet 5  . Benralizumab (FASENRA) 30 MG/ML SOSY Inject 30 mg into the skin every 30 (thirty) days. 1 Syringe 6  . budesonide-formoterol (SYMBICORT) 160-4.5 MCG/ACT inhaler Inhale 2 puffs into the lungs 2 (two) times daily. Rinse gargle and spit after use 1 Inhaler 5  . Cholecalciferol (VITAMIN D3) 5000 units CAPS Take by mouth.    . docusate sodium (COLACE) 100 MG capsule Take 200 mg by mouth.    . EPINEPHrine 0.3 mg/0.3 mL IJ SOAJ injection Use as directed for severe allergic reactions 2 Device 1  . fexofenadine (ALLEGRA) 180 MG tablet Take 180 mg by mouth.    . fluticasone (FLOVENT HFA) 110 MCG/ACT inhaler Inhale 2 puffs into the lungs 2 (two) times daily. 1 Inhaler 5  . gabapentin (NEURONTIN) 100 MG capsule TAKE 1 CAPSULE(100 MG) BY MOUTH AT BEDTIME 30 capsule 3  .  ipratropium-albuterol (DUONEB) 0.5-2.5 (3) MG/3ML SOLN 3 mLs.    Marland Kitchen  ipratropium-albuterol (DUONEB) 0.5-2.5 (3) MG/3ML SOLN 1 vial in nebulizer every 4-6 hours while awake for the next 3-5 days for coughing or wheezing. 360 mL 1  . levothyroxine (SYNTHROID, LEVOTHROID) 75 MCG tablet TAKE 1 TABLET(75 MCG) BY MOUTH DAILY BEFORE BREAKFAST 30 tablet 5  . losartan (COZAAR) 50 MG tablet TAKE 1 TABLET(50 MG) BY MOUTH DAILY 90 tablet 0  . MAGNESIUM PO Take 500 mg by mouth.    . montelukast (SINGULAIR) 10 MG tablet Take 1 tablet (10 mg total) by mouth at bedtime. 90 tablet 3  . Multiple Vitamins-Minerals (OCUVITE EXTRA PO) Take by mouth.    . umeclidinium bromide (INCRUSE ELLIPTA) 62.5 MCG/INH AEPB Inhale 1 puff into the lungs daily. 30 each 0   Current Facility-Administered Medications on File Prior to Visit  Medication Dose Route Frequency Provider Last Rate Last Dose  . 0.9 %  sodium chloride infusion  500 mL Intravenous Once Gatha Mayer, MD      . Benralizumab SOSY 30 mg  30 mg Subcutaneous Q8 Weeks Bobbitt, Sedalia Muta, MD   30 mg at 03/30/18 0906    BP 118/70 (BP Location: Right Arm, Patient Position: Sitting, Cuff Size: Normal)   Pulse 78   Temp (!) 97.5 F (36.4 C) (Oral)   Resp 16   Ht 5\' 7"  (1.702 m)   Wt 225 lb (102.1 kg)   SpO2 96%   BMI 35.24 kg/m       Objective:   Physical Exam Constitutional:      General: He is not in acute distress.    Appearance: He is well-developed.  HENT:     Head: Normocephalic and atraumatic.  Cardiovascular:     Rate and Rhythm: Normal rate and regular rhythm.     Heart sounds: No murmur.  Pulmonary:     Effort: Pulmonary effort is normal. No respiratory distress.     Breath sounds: No wheezing or rales.     Comments: Breath sounds mildly diminished at the bases Skin:    General: Skin is warm and dry.  Neurological:     Mental Status: He is alert and oriented to person, place, and time.  Psychiatric:        Behavior: Behavior normal.         Thought Content: Thought content normal.           Assessment & Plan:  Asthma- acute exacerbation is improving but he is not back to baseline. He is not on a LABA currently. Has rx for symbicort. Will have him resume symbicort and refer him to pulmonology. He is advised to call if symptoms fail to continue to improve.  HTN-bp stable on current regimen, continue same. Obtain, bmet.  Hypothyroid- continues synthroid, obtain follow up TSH.    Gout- minor flare, declines rx at this time. Monitor.

## 2018-05-25 NOTE — Patient Instructions (Signed)
Please complete lab work prior to leaving.   

## 2018-06-01 ENCOUNTER — Ambulatory Visit: Payer: Self-pay

## 2018-06-01 ENCOUNTER — Ambulatory Visit: Payer: Managed Care, Other (non HMO)

## 2018-06-08 ENCOUNTER — Ambulatory Visit: Payer: Managed Care, Other (non HMO)

## 2018-06-08 ENCOUNTER — Ambulatory Visit (INDEPENDENT_AMBULATORY_CARE_PROVIDER_SITE_OTHER): Payer: Managed Care, Other (non HMO) | Admitting: *Deleted

## 2018-06-08 DIAGNOSIS — J4550 Severe persistent asthma, uncomplicated: Secondary | ICD-10-CM

## 2018-06-09 ENCOUNTER — Emergency Department (HOSPITAL_BASED_OUTPATIENT_CLINIC_OR_DEPARTMENT_OTHER)
Admission: EM | Admit: 2018-06-09 | Discharge: 2018-06-10 | Disposition: A | Discharge status: Home / Self Care | Payer: Managed Care, Other (non HMO) | Attending: Emergency Medicine | Admitting: Emergency Medicine

## 2018-06-09 ENCOUNTER — Other Ambulatory Visit: Payer: Self-pay | Admitting: Allergy and Immunology

## 2018-06-09 ENCOUNTER — Other Ambulatory Visit: Payer: Self-pay

## 2018-06-09 ENCOUNTER — Encounter (HOSPITAL_BASED_OUTPATIENT_CLINIC_OR_DEPARTMENT_OTHER): Payer: Self-pay | Admitting: *Deleted

## 2018-06-09 DIAGNOSIS — I10 Essential (primary) hypertension: Secondary | ICD-10-CM | POA: Diagnosis not present

## 2018-06-09 DIAGNOSIS — E039 Hypothyroidism, unspecified: Secondary | ICD-10-CM | POA: Insufficient documentation

## 2018-06-09 DIAGNOSIS — R112 Nausea with vomiting, unspecified: Secondary | ICD-10-CM | POA: Insufficient documentation

## 2018-06-09 DIAGNOSIS — Z8546 Personal history of malignant neoplasm of prostate: Secondary | ICD-10-CM | POA: Insufficient documentation

## 2018-06-09 DIAGNOSIS — J4550 Severe persistent asthma, uncomplicated: Secondary | ICD-10-CM | POA: Insufficient documentation

## 2018-06-09 DIAGNOSIS — K529 Noninfective gastroenteritis and colitis, unspecified: Secondary | ICD-10-CM

## 2018-06-09 DIAGNOSIS — R197 Diarrhea, unspecified: Secondary | ICD-10-CM | POA: Insufficient documentation

## 2018-06-09 DIAGNOSIS — Z85828 Personal history of other malignant neoplasm of skin: Secondary | ICD-10-CM | POA: Insufficient documentation

## 2018-06-09 DIAGNOSIS — Z79899 Other long term (current) drug therapy: Secondary | ICD-10-CM | POA: Insufficient documentation

## 2018-06-09 LAB — COMPREHENSIVE METABOLIC PANEL
ALT: 24 U/L (ref 0–44)
AST: 22 U/L (ref 15–41)
Albumin: 4.2 g/dL (ref 3.5–5.0)
Alkaline Phosphatase: 63 U/L (ref 38–126)
Anion gap: 8 (ref 5–15)
BUN: 27 mg/dL — ABNORMAL HIGH (ref 8–23)
CO2: 21 mmol/L — AB (ref 22–32)
Calcium: 8.8 mg/dL — ABNORMAL LOW (ref 8.9–10.3)
Chloride: 106 mmol/L (ref 98–111)
Creatinine, Ser: 1.1 mg/dL (ref 0.61–1.24)
GFR calc Af Amer: >60 mL/min (ref >60)
GFR calc non Af Amer: >60 mL/min (ref >60)
Glucose, Bld: 139 mg/dL — ABNORMAL HIGH (ref 70–99)
Potassium: 3.7 mmol/L (ref 3.5–5.1)
Sodium: 135 mmol/L (ref 135–145)
Total Bilirubin: 0.9 mg/dL (ref 0.3–1.2)
Total Protein: 7.2 g/dL (ref 6.5–8.1)

## 2018-06-09 LAB — CBC
HCT: 49.4 % (ref 39.0–52.0)
HEMOGLOBIN: 16.2 g/dL (ref 13.0–17.0)
MCH: 30.8 pg (ref 26.0–34.0)
MCHC: 32.8 g/dL (ref 30.0–36.0)
MCV: 93.9 fL (ref 80.0–100.0)
Platelets: 253 10*3/uL (ref 150–400)
RBC: 5.26 MIL/uL (ref 4.22–5.81)
RDW: 13.7 % (ref 11.5–15.5)
WBC: 12.4 10*3/uL — ABNORMAL HIGH (ref 4.0–10.5)
nRBC: 0 % (ref 0.0–0.2)

## 2018-06-09 LAB — LIPASE, BLOOD: Lipase: 31 U/L (ref 11–51)

## 2018-06-09 MED ORDER — KETOROLAC TROMETHAMINE 30 MG/ML IJ SOLN
30.0000 mg | Freq: Once | INTRAMUSCULAR | Status: AC
  Administered 2018-06-09: 30 mg via INTRAVENOUS
  Filled 2018-06-09: qty 1

## 2018-06-09 MED ORDER — ONDANSETRON 4 MG PO TBDP
4.0000 mg | ORAL_TABLET | Freq: Once | ORAL | Status: AC | PRN
  Administered 2018-06-09: 4 mg via ORAL
  Filled 2018-06-09: qty 1

## 2018-06-09 MED ORDER — PROMETHAZINE HCL 25 MG/ML IJ SOLN
INTRAMUSCULAR | Status: AC
  Filled 2018-06-09: qty 1

## 2018-06-09 MED ORDER — PROMETHAZINE HCL 25 MG/ML IJ SOLN
12.5000 mg | Freq: Once | INTRAMUSCULAR | Status: AC
  Administered 2018-06-09: 12.5 mg via INTRAVENOUS

## 2018-06-09 MED ORDER — SODIUM CHLORIDE 0.9 % IV BOLUS
1000.0000 mL | Freq: Once | INTRAVENOUS | Status: AC
  Administered 2018-06-09: 1000 mL via INTRAVENOUS

## 2018-06-09 NOTE — ED Triage Notes (Signed)
Pt reports n/v/d since 2pm today, "too many to count"

## 2018-06-09 NOTE — ED Provider Notes (Signed)
[facility] EMERGENCY DEPARTMENT Provider Note   CSN: [id number] Arrival date & time: 06/09/18  2041     History   Chief Complaint Chief Complaint  Patient presents with  . Emesis  . Diarrhea    HPI Michael [name] is a 68 y.o. male.  Patient is a 68 year old male with past medical history of asthma, prostate cancer, prior cholecystectomy, and hypertension.  He presents today for evaluation of nausea, vomiting, and diarrhea.  This started earlier this afternoon.  He describes his symptoms as severe with multiple episodes of each.  All have been nonbloody.  He denies fevers or chills.  He denies ill contacts.  He denies having consumed any suspicious foods.  The history is provided by the patient.  Emesis  Severity:  Severe Duration:  8 hours Timing:  Constant Quality:  Stomach contents Progression:  Improving Chronicity:  New Recent urination:  Decreased Relieved by:  Nothing Worsened by:  Nothing Ineffective treatments:  None tried   Past Medical History:  Diagnosis Date  . Allergy   . Arthritis   . Asthma   . Cancer (HCC)    basal cell and squamous cell carcinoma  . Cancer [facility])    prostate  . Clotting disorder ([place])   . Colon polyps   . GERD (gastroesophageal reflux disease)   . Gout   . High cholesterol   . History of hepatitis    due to drug interaction?  . History of pancreatitis 2016   ?due to colchicine  . History of pulmonary embolus (PE) 2016  . History of stomach ulcers   . Hypertension   . OSA (obstructive sleep apnea) 2015   has CPAP  . Sleep apnea   . Thyroid disease    hypothyroid  . Urinary incontinence     Patient Active Problem List   Diagnosis Date Noted  . Left knee pain 08/22/2017  . Asthma with acute exacerbation 06/08/2017  . Acute maxillary sinusitis 06/08/2017  . Hypothyroid 03/24/2017  . Hyperlipidemia 03/24/2017  . Gout 03/24/2017  . Squamous cell carcinoma 03/24/2017  . History of pulmonary embolism  03/24/2017  . Prostate cancer ([place]) 03/24/2017  . OSA (obstructive sleep apnea) 03/24/2017  . History of colon polyps 03/24/2017  . Hypertension 03/24/2017  . Severe persistent asthma 02/09/2017  . Allergic rhinitis 02/09/2017  . Chronic sinusitis 02/09/2017  . History of nasal polyp 02/09/2017  . Aspirin sensitivity 02/09/2017    Past Surgical History:  Procedure Laterality Date  . BASAL CELL CARCINOMA EXCISION  2017   MOHS.  beneath right eye  . CHOLECYSTECTOMY  2001  . COLONOSCOPY     multiple - last 06/2017  . PENILE PROSTHESIS IMPLANT  2016  . PROSTATECTOMY  2009  . SINUS SURGERY WITH INSTATRAK  2014  . SQUAMOUS CELL CARCINOMA EXCISION  01/2017   nose  . TOTAL HIP ARTHROPLASTY  06/2016        Home Medications    Prior to Admission medications   Medication Sig Start Date End Date Taking? Authorizing Provider  albuterol (PROVENTIL HFA;VENTOLIN HFA) 108 (90 Base) MCG/ACT inhaler Inhale 2 puffs into the lungs every 6 (six) hours as needed for wheezing or shortness of breath. 05/18/18   Kozlow, Donnamarie Poag, MD  allopurinol (ZYLOPRIM) 100 MG tablet TAKE 1 TABLET BY MOUTH DAILY X 1 WEEK THEN 2 TABLETS BY MOUTH DAILY 05/10/18   Copland, Gay Filler, MD  Benralizumab (FASENRA) 30 MG/ML SOSY Inject 30 mg into the skin every  30 (thirty) days. 05/18/18   Bobbitt, Sedalia Muta, MD  budesonide-formoterol [facility]) 160-4.5 MCG/ACT inhaler Inhale 2 puffs into the lungs 2 (two) times daily. Rinse gargle and spit after use 11/20/17   Bobbitt, Sedalia Muta, MD  Cholecalciferol (VITAMIN D3) 5000 units CAPS Take by mouth.    [provider]  docusate sodium (COLACE) 100 MG capsule Take 200 mg by mouth.    [provider]  EPINEPHrine 0.3 mg/0.3 mL IJ SOAJ injection Use as directed for severe allergic reactions 06/16/17   Bobbitt, Sedalia Muta, MD  fexofenadine (ALLEGRA) 180 MG tablet Take 180 mg by mouth.    [provider]  fluticasone (FLOVENT HFA) 110 MCG/ACT inhaler  Inhale 2 puffs into the lungs 2 (two) times daily. 05/11/18   Garnet Sierras, DO  gabapentin (NEURONTIN) 100 MG capsule TAKE 1 CAPSULE(100 MG) BY MOUTH AT BEDTIME 05/10/18   Debbrah Alar, NP  ipratropium-albuterol (DUONEB) 0.5-2.5 (3) MG/3ML SOLN 1 vial in nebulizer every 4-6 hours while awake for the next 3-5 days for coughing or wheezing. 05/11/18   Garnet Sierras, DO  levothyroxine (SYNTHROID, LEVOTHROID) 75 MCG tablet TAKE 1 TABLET(75 MCG) BY MOUTH DAILY BEFORE BREAKFAST 05/08/18   Debbrah Alar, NP  losartan (COZAAR) 50 MG tablet TAKE 1 TABLET(50 MG) BY MOUTH DAILY 05/10/18   Debbrah Alar, NP  MAGNESIUM PO Take 500 mg by mouth.    [provider]  montelukast (SINGULAIR) 10 MG tablet Take 1 tablet (10 mg total) by mouth at bedtime. 02/09/17   Bobbitt, Sedalia Muta, MD  Multiple Vitamins-Minerals (OCUVITE EXTRA PO) Take by mouth.    [provider]    Family History Family History  Problem Relation Age of Onset  . Asthma Mother   . Arthritis Mother   . Skin cancer Mother        basal cell carcinoma  . Asthma Father   . Arthritis Father   . Diabetes Father   . Heart attack Father   . Hyperlipidemia Father   . Hypertension Father   . Kidney disease Father   . Diabetes Brother   . Breast cancer Maternal Grandmother   . Diabetes Paternal Grandfather   . Arthritis Brother   . Hyperlipidemia Brother   . Alcohol abuse Brother   . Colon cancer Neg Hx   . Esophageal cancer Neg Hx   . Liver cancer Neg Hx   . Pancreatic cancer Neg Hx   . Rectal cancer Neg Hx   . Stomach cancer Neg Hx     Social History Social History   Tobacco Use  . Smoking status: Never Smoker  . Smokeless tobacco: Never Used  Substance Use Topics  . Alcohol use: Yes    Alcohol/week: 1.0 standard drinks    Types: 1 Glasses of wine per week  . Drug use: No     Allergies   Colchicine; Moxifloxacin; Doxycycline; Statins; Avelox [moxifloxacin hcl in nacl]; Cefdinir; Adhesive   [tape]; and Penicillins   Review of Systems Review of Systems  All other systems reviewed and are negative.    Physical Exam Updated Vital Signs BP 107/71 (BP Location: Right Arm)   Pulse 76   Temp 99.7 F (37.6 C) (Oral)   Resp 16   Ht 5\' 7"  (1.702 m)   Wt 102 kg   SpO2 93%   BMI 35.22 kg/m   Physical Exam Vitals signs and nursing note reviewed.  Constitutional:      General: He is not in acute  distress.    Appearance: He is well-developed. He is not diaphoretic.  HENT:     Head: Normocephalic and atraumatic.     Mouth/Throat:     Mouth: Mucous membranes are dry.  Neck:     Musculoskeletal: Normal range of motion and neck supple.  Cardiovascular:     Rate and Rhythm: Normal rate and regular rhythm.     Heart sounds: No murmur. No friction rub.  Pulmonary:     Effort: Pulmonary effort is normal. No respiratory distress.     Breath sounds: Normal breath sounds. No wheezing or rales.  Abdominal:     General: Bowel sounds are normal. There is no distension.     Palpations: Abdomen is soft.     Tenderness: There is no abdominal tenderness.  Musculoskeletal: Normal range of motion.  Skin:    General: Skin is warm and dry.  Neurological:     Mental Status: He is alert and oriented to person, place, and time.     Coordination: Coordination normal.      ED Treatments / Results  Labs (all labs ordered are listed, but only abnormal results are displayed) Labs Reviewed  COMPREHENSIVE METABOLIC PANEL - Abnormal; Notable for the following components:      Result Value   CO2 21 (*)    Glucose, Bld 139 (*)    BUN 27 (*)    Calcium 8.8 (*)    All other components within normal limits  CBC - Abnormal; Notable for the following components:   WBC 12.4 (*)    All other components within normal limits  LIPASE, BLOOD  URINALYSIS, ROUTINE W REFLEX MICROSCOPIC    EKG None  Radiology No results found.  Procedures Procedures (including critical care  time)  Medications Ordered in ED Medications  sodium chloride 0.9 % bolus 1,000 mL (has no administration in time range)  ketorolac (TORADOL) 30 MG/ML injection 30 mg (has no administration in time range)  promethazine (PHENERGAN) injection 12.5 mg (has no administration in time range)  ondansetron (ZOFRAN-ODT) disintegrating tablet 4 mg (4 mg Oral Given 06/09/18 2108)     Initial Impression / Assessment and Plan / ED Course  I have reviewed the triage vital signs and the nursing notes.  Pertinent labs & imaging results that were available during my care of the patient were reviewed by me and considered in my medical decision making (see chart for details).  Patient's presentation, exam, laboratory studies, and response to medications most consistent with a viral gastroenteritis.  He was given IV fluids and antiemetics and he is now feeling much better.  He is tolerating oral liquid.  At this point, I feel as though he is appropriate for discharge.  He will be given ODT Zofran which he can take as needed.  He is to return as needed.  Final Clinical Impressions(s) / ED Diagnoses   Final diagnoses:  None    ED Discharge Orders    None       Veryl Speak, MD 06/10/18 [phone]

## 2018-06-09 NOTE — ED Notes (Addendum)
Pt states he has N/V/D since approx 2 pm today. States he "felt fine upon awakening". Pt ate lunch at 12pm and felt sick 2 hours later. Family member ate same food. Pt also c/o abdominal cramping an, Headache and chills. Low grade temp in triage. Denies chest pain, states weak and light headed.

## 2018-06-09 NOTE — ED Notes (Signed)
EMT updated pt's vitals and pt and visitor stated they were upset "we've been waiting for two hours and have not seen a doctor" EMT apologized for the wait and stated the doctor would be in as soon as he could. EMT encouraged pt to try and attempt a urine sample while waiting.

## 2018-06-10 ENCOUNTER — Telehealth (HOSPITAL_BASED_OUTPATIENT_CLINIC_OR_DEPARTMENT_OTHER): Payer: Self-pay | Admitting: Emergency Medicine

## 2018-06-10 LAB — URINALYSIS, ROUTINE W REFLEX MICROSCOPIC
Glucose, UA: NEGATIVE mg/dL
Hgb urine dipstick: NEGATIVE
Ketones, ur: NEGATIVE mg/dL
Leukocytes,Ua: NEGATIVE
Nitrite: NEGATIVE
Protein, ur: 30 mg/dL — AB
Specific Gravity, Urine: >1.03 — ABNORMAL HIGH (ref 1.005–1.030)
pH: 6 (ref 5.0–8.0)

## 2018-06-10 LAB — URINALYSIS, MICROSCOPIC (REFLEX): SQUAMOUS EPITHELIAL / LPF: NONE SEEN (ref 0–5)

## 2018-06-10 MED ORDER — ONDANSETRON 8 MG PO TBDP: ORAL_TABLET | ORAL | 0 refills | Status: DC

## 2018-06-10 NOTE — ED Notes (Signed)
Urine sample collected and sent to lab incase needed

## 2018-06-10 NOTE — Discharge Instructions (Addendum)
Zofran as prescribed as needed for nausea.  Clear liquid diet as tolerated for the next 12 hours, then advance to bland foods such as crackers and toast.  Return to the emergency department if you develop severe abdominal pain, high fevers, bloody stools, or other new and concerning symptoms.

## 2018-06-10 NOTE — ED Notes (Signed)
Pt denies pain and nausea. Given Gingerale for PO challenge.

## 2018-06-15 ENCOUNTER — Ambulatory Visit (INDEPENDENT_AMBULATORY_CARE_PROVIDER_SITE_OTHER): Payer: Managed Care, Other (non HMO)

## 2018-06-15 ENCOUNTER — Ambulatory Visit: Payer: Managed Care, Other (non HMO)

## 2018-06-15 DIAGNOSIS — Z23 Encounter for immunization: Secondary | ICD-10-CM

## 2018-06-15 NOTE — Progress Notes (Signed)
Patient is here for shingles injection per [facility]. 0.37mL shingrix given IM in left deltoid. VIS given. Patient tolerated well.

## 2018-07-19 ENCOUNTER — Institutional Professional Consult (permissible substitution): Payer: Managed Care, Other (non HMO) | Admitting: Pulmonary Disease

## 2018-08-08 ENCOUNTER — Other Ambulatory Visit: Payer: Self-pay | Admitting: Allergy and Immunology

## 2018-08-08 DIAGNOSIS — J4550 Severe persistent asthma, uncomplicated: Secondary | ICD-10-CM

## 2018-08-10 ENCOUNTER — Ambulatory Visit (INDEPENDENT_AMBULATORY_CARE_PROVIDER_SITE_OTHER): Payer: Managed Care, Other (non HMO)

## 2018-08-10 ENCOUNTER — Other Ambulatory Visit: Payer: Self-pay

## 2018-08-10 DIAGNOSIS — J4550 Severe persistent asthma, uncomplicated: Secondary | ICD-10-CM

## 2018-08-16 ENCOUNTER — Institutional Professional Consult (permissible substitution): Payer: Managed Care, Other (non HMO) | Admitting: Pulmonary Disease

## 2018-08-24 ENCOUNTER — Other Ambulatory Visit: Payer: Self-pay

## 2018-08-24 ENCOUNTER — Telehealth: Payer: Self-pay | Admitting: Cardiology

## 2018-08-24 ENCOUNTER — Telehealth (INDEPENDENT_AMBULATORY_CARE_PROVIDER_SITE_OTHER): Payer: Managed Care, Other (non HMO) | Admitting: Family

## 2018-08-24 DIAGNOSIS — I10 Essential (primary) hypertension: Secondary | ICD-10-CM

## 2018-08-24 DIAGNOSIS — E039 Hypothyroidism, unspecified: Secondary | ICD-10-CM

## 2018-08-24 DIAGNOSIS — J4550 Severe persistent asthma, uncomplicated: Secondary | ICD-10-CM | POA: Diagnosis not present

## 2018-08-24 DIAGNOSIS — M1A9XX0 Chronic gout, unspecified, without tophus (tophi): Secondary | ICD-10-CM

## 2018-08-24 DIAGNOSIS — R001 Bradycardia, unspecified: Secondary | ICD-10-CM

## 2018-08-24 DIAGNOSIS — G4733 Obstructive sleep apnea (adult) (pediatric): Secondary | ICD-10-CM

## 2018-08-24 NOTE — Progress Notes (Signed)
Virtual Visit via Video Note  I connected with Michael [name] on 08/24/18 at  8:00 AM EDT by a video enabled telemedicine application and verified that I am speaking with the correct person using two identifiers. This visit type was conducted due to national recommendations for restrictions regarding the COVID-19 Pandemic (e.g. social distancing).  This format is felt to be most appropriate for this patient at this time.   I discussed the limitations of evaluation and management by telemedicine and the availability of in person appointments. The patient expressed understanding and agreed to proceed.  Only the patient and myself were on today's video visit. The patient was at home and I was in my office at the time of today's visit.   History of Present Illness:  Patient presents today for routine follow up.  Asthma- following with allergist- Dr. Starling Manns.  Maintained on Fasenra, singulair, symbicort.     Gout- maintained on allopurinol.  Reports that he had a flare up about 4 weeks ago.   HTN- mainatined on losartan 50mg .  He reports that his bp has been 115-120.   BP Readings from Last 3 Encounters:  06/10/18 97/61  05/25/18 118/70  05/11/18 134/80   OSA- maintained on cpap. Reports good compliance.    Hypothyroid- maintained on synthroid 60mcg.   Lab Results  Component Value Date   TSH 2.66 05/25/2018   Reports that his HR gets down 40-45 bpm in the afternoon. Reports that it bounces up to the 50's.  Reports he is usually sitting down when this occurs. Denies, associated dizziness or sob. Reports that 2.5 years ago he had echo, stress test.   Observations/Objective:   Gen: Awake, alert, no acute distress Resp: Breathing is even and non-labored Psych: calm/pleasant demeanor Neuro: Alert and Oriented x 3, + facial symmetry, speech is clear.  Assessment and Plan:  Asthma- stable, management per allergy.  HTN- had low bp back in February but he was dehydrated in the ED. Home  reading stable.  Bradycardia- will refer to cardiology for further evaluation.  Gout- had flare up about 1 month ago- continue allopurinol.  OSA- stable on cpap, continue same.   Hypothyroid- stable on current dose of synthroid.   Follow Up Instructions:  Follow up in 3 months. Will plan follow up lab work at that time.  I discussed the assessment and treatment plan with the patient. The patient was provided an opportunity to ask questions and all were answered. The patient agreed with the plan and demonstrated an understanding of the instructions.   The patient was advised to call back or seek an in-person evaluation if the symptoms worsen or if the condition fails to improve as anticipated.    Nance Pear, NP

## 2018-08-24 NOTE — Telephone Encounter (Signed)
Virtual Visit Pre-Appointment Phone Call  "(Name), I am calling you today to discuss your upcoming appointment. We are currently trying to limit exposure to the virus that causes COVID-19 by seeing patients at home rather than in the office."  1. "What is the BEST phone number to call the day of the visit?" - include this in appointment notes  2. Do you have or have access to (through a family member/friend) a smartphone with video capability that we can use for your visit?" a. If yes - list this number in appt notes as cell (if different from BEST phone #) and list the appointment type as a VIDEO visit in appointment notes b. If no - list the appointment type as a PHONE visit in appointment notes  Confirm consent - "In the setting of the current Covid19 crisis, you are scheduled for a (phone or video) visit with your provider on (date) at (time).  Just as we do with many in-office visits, in order for you to participate in this visit, we must obtain consent.  If you'd like, I can send this to your mychart (if signed up) or email for you to review.  Otherwise, I can obtain your verbal consent now.  All virtual visits are billed to your insurance company just like a normal visit would be.  By agreeing to a virtual visit, we'd like you to understand that the technology does not allow for your provider to perform an examination, and thus may limit your provider's ability to fully assess your condition. If your provider identifies any concerns that need to be evaluated in person, we will make arrangements to do so.  Finally, though the technology is pretty good, we cannot assure that it will always work on either your or our end, and in the setting of a video visit, we may have to convert it to a phone-only visit.  In either situation, we cannot ensure that we have a secure connection.  Are you willing to proceed?" STAFF: Did the patient verbally acknowledge consent to telehealth visit? Document  YES/NO here: YES 3. Advise patient to be prepared - "Two hours prior to your appointment, go ahead and check your blood pressure, pulse, oxygen saturation, and your weight (if you have the equipment to check those) and write them all down. When your visit starts, your provider will ask you for this information. If you have an Apple Watch or Kardia device, please plan to have heart rate information ready on the day of your appointment. Please have a pen and paper handy nearby the day of the visit as well."  4. Give patient instructions for MyChart download to smartphone OR Doximity/Doxy.me as below if video visit (depending on what platform provider is using)  5. Inform patient they will receive a phone call 15 minutes prior to their appointment time (may be from unknown caller ID) so they should be prepared to answer    TELEPHONE CALL NOTE  Michael [name] has been deemed a candidate for a follow-up tele-health visit to limit community exposure during the Covid-19 pandemic. I spoke with the patient via phone to ensure availability of phone/video source, confirm preferred email & phone number, and discuss instructions and expectations.  I reminded Michael [name] to be prepared with any vital sign and/or heart rhythm information that could potentially be obtained via home monitoring, at the time of his visit. I reminded Michael [name] to expect a phone call prior to his visit.  Michael [name] 08/24/2018 11:46 AM   INSTRUCTIONS FOR DOWNLOADING THE MYCHART APP TO SMARTPHONE  - The patient must first make sure to have activated MyChart and know their login information - If Apple, go to [employer] and type in MyChart in the search bar and download the app. If Android, ask patient to go to [employer] and type in [place] in the search bar and download the app. The app is free but as with any other app downloads, their phone may require them to verify saved payment information or  Apple/Android password.  - The patient will need to then log into the app with their MyChart username and password, and select [facility] as their healthcare provider to link the account. When it is time for your visit, go to the MyChart app, find appointments, and click Begin Video Visit. Be sure to Select Allow for your device to access the Microphone and Camera for your visit. You will then be connected, and your provider will be with you shortly.  **If they have any issues connecting, or need assistance please contact MyChart service desk [phone])**  **If using a computer, in order to ensure the best quality for their visit they will need to use either of the following Internet Browsers: [employer], or Google Chrome**  IF USING DOXIMITY or DOXY.ME - The patient will receive a link just prior to their visit by text.     FULL LENGTH CONSENT FOR TELE-HEALTH VISIT   I hereby voluntarily request, consent and authorize [facility] and its employed or contracted physicians, physician assistants, nurse practitioners or other licensed health care professionals (the Practitioner), to provide me with telemedicine health care services (the Services") as deemed necessary by the treating Practitioner. I acknowledge and consent to receive the Services by the Practitioner via telemedicine. I understand that the telemedicine visit will involve communicating with the Practitioner through live audiovisual communication technology and the disclosure of certain medical information by electronic transmission. I acknowledge that I have been given the opportunity to request an in-person assessment or other available alternative prior to the telemedicine visit and am voluntarily participating in the telemedicine visit.  I understand that I have the right to withhold or withdraw my consent to the use of telemedicine in the course of my care at any time, without affecting my right to future care  or treatment, and that the Practitioner or I may terminate the telemedicine visit at any time. I understand that I have the right to inspect all information obtained and/or recorded in the course of the telemedicine visit and may receive copies of available information for a reasonable fee.  I understand that some of the potential risks of receiving the Services via telemedicine include:   Delay or interruption in medical evaluation due to technological equipment failure or disruption;  Information transmitted may not be sufficient (e.g. poor resolution of images) to allow for appropriate medical decision making by the Practitioner; and/or   In rare instances, security protocols could fail, causing a breach of personal health information.  Furthermore, I acknowledge that it is my responsibility to provide information about my medical history, conditions and care that is complete and accurate to the best of my ability. I acknowledge that Practitioner's advice, recommendations, and/or decision may be based on factors not within their control, such as incomplete or inaccurate data provided by me or distortions of diagnostic images or specimens that may result from electronic transmissions. I understand that the  practice of medicine is not an Chief Strategy Officer and that Practitioner makes no warranties or guarantees regarding treatment outcomes. I acknowledge that I will receive a copy of this consent concurrently upon execution via email to the email address I last provided but may also request a printed copy by calling the office of [facility].    I understand that my insurance will be billed for this visit.   I have read or had this consent read to me.  I understand the contents of this consent, which adequately explains the benefits and risks of the Services being provided via telemedicine.   I have been provided ample opportunity to ask questions regarding this consent and the Services and have had  my questions answered to my satisfaction.  I give my informed consent for the services to be provided through the use of telemedicine in my medical care  By participating in this telemedicine visit I agree to the above.

## 2018-08-27 ENCOUNTER — Other Ambulatory Visit: Payer: Self-pay

## 2018-08-27 ENCOUNTER — Telehealth (INDEPENDENT_AMBULATORY_CARE_PROVIDER_SITE_OTHER): Payer: Managed Care, Other (non HMO) | Admitting: Cardiology

## 2018-08-27 ENCOUNTER — Encounter: Payer: Self-pay | Admitting: Cardiology

## 2018-08-27 VITALS — BP 139/90 | Ht 67.0 in | Wt 224.0 lb

## 2018-08-27 DIAGNOSIS — E782 Mixed hyperlipidemia: Secondary | ICD-10-CM

## 2018-08-27 DIAGNOSIS — R001 Bradycardia, unspecified: Secondary | ICD-10-CM

## 2018-08-27 DIAGNOSIS — G4733 Obstructive sleep apnea (adult) (pediatric): Secondary | ICD-10-CM

## 2018-08-27 DIAGNOSIS — I10 Essential (primary) hypertension: Secondary | ICD-10-CM | POA: Diagnosis not present

## 2018-08-28 ENCOUNTER — Telehealth: Payer: Self-pay

## 2018-08-28 NOTE — Patient Instructions (Signed)

## 2018-08-28 NOTE — Progress Notes (Signed)
Virtual Visit via Video Note   This visit type was conducted due to national recommendations for restrictions regarding the COVID-19 Pandemic (e.g. social distancing) in an effort to limit this patient's exposure and mitigate transmission in our community.  Due to his co-morbid illnesses, this patient is at least at moderate risk for complications without adequate follow up.  This format is felt to be most appropriate for this patient at this time.  All issues noted in this document were discussed and addressed.  A limited physical exam was performed with this format.  Please refer to the patient's chart for his consent to telehealth for [facility].   Date:  08/28/2018   ID:  [place], DOB 04/10/1951, MRN [id number]  Patient Location: Home Provider Location: Home  PCP:  Debbrah Alar, NP  Cardiologist:  No primary care provider on file.  Electrophysiologist:  None   Evaluation Performed:  New Patient Evaluation  Chief Complaint: Bradycardia  History of Present Illness:    Michael [name] is a 68 y.o. male with past medical history of essential hypertension, dyslipidemia and obesity.  The patient mentions to me that he notices his heart rate to be on the lower side pretty much in the low 40s at rest the 50s when he is taking care of activities of daily living and in the 70s when he exerts himself.  No chest pain orthopnea or PND.  He has never passed out or any such issues.  No shortness of breath on exertion but he leads a sedentary lifestyle and is significantly overweight.  At the time of my evaluation, the patient is alert awake oriented and in no distress.  The patient does not have symptoms concerning for COVID-19 infection (fever, chills, cough, or new shortness of breath).    Past Medical History:  Diagnosis Date  . Allergy   . Arthritis   . Asthma   . Cancer (HCC)    basal cell and squamous cell carcinoma  . Cancer [facility])    prostate  .  Clotting disorder ([place])   . Colon polyps   . GERD (gastroesophageal reflux disease)   . Gout   . High cholesterol   . History of hepatitis    due to drug interaction?  . History of pancreatitis 2016   ?due to colchicine  . History of pulmonary embolus (PE) 2016  . History of stomach ulcers   . Hypertension   . OSA (obstructive sleep apnea) 2015   has CPAP  . Sleep apnea   . Thyroid disease    hypothyroid  . Urinary incontinence    Past Surgical History:  Procedure Laterality Date  . BASAL CELL CARCINOMA EXCISION  2017   MOHS.  beneath right eye  . CHOLECYSTECTOMY  2001  . COLONOSCOPY     multiple - last 06/2017  . PENILE PROSTHESIS IMPLANT  2016  . PROSTATECTOMY  2009  . SINUS SURGERY WITH INSTATRAK  2014  . SQUAMOUS CELL CARCINOMA EXCISION  01/2017   nose  . TOTAL HIP ARTHROPLASTY  06/2016     Current Meds  Medication Sig  . albuterol (PROVENTIL HFA;VENTOLIN HFA) 108 (90 Base) MCG/ACT inhaler Inhale 2 puffs into the lungs every 6 (six) hours as needed for wheezing or shortness of breath.  . allopurinol (ZYLOPRIM) 100 MG tablet TAKE 1 TABLET BY MOUTH DAILY X 1 WEEK THEN 2 TABLETS BY MOUTH DAILY  . Cholecalciferol (VITAMIN D3) 5000 units CAPS Take by mouth.  . docusate  sodium (COLACE) 100 MG capsule Take 200 mg by mouth daily.   Marland Kitchen EPINEPHrine 0.3 mg/0.3 mL IJ SOAJ injection Use as directed for severe allergic reactions  . fexofenadine (ALLEGRA) 180 MG tablet Take 180 mg by mouth daily.   . fluticasone (FLOVENT HFA) 110 MCG/ACT inhaler Inhale 2 puffs into the lungs 2 (two) times daily.  Marland Kitchen gabapentin (NEURONTIN) 100 MG capsule TAKE 1 CAPSULE(100 MG) BY MOUTH AT BEDTIME  . INCRUSE ELLIPTA 62.5 MCG/INH AEPB INHALE 1 PUFF INTO THE LUNGS DAILY  . ipratropium-albuterol (DUONEB) 0.5-2.5 (3) MG/3ML SOLN 1 vial in nebulizer every 4-6 hours while awake for the next 3-5 days for coughing or wheezing.  Marland Kitchen levothyroxine (SYNTHROID, LEVOTHROID) 75 MCG tablet TAKE 1 TABLET(75 MCG) BY MOUTH  DAILY BEFORE BREAKFAST  . losartan (COZAAR) 50 MG tablet TAKE 1 TABLET(50 MG) BY MOUTH DAILY  . montelukast (SINGULAIR) 10 MG tablet TAKE 1 TABLET(10 MG) BY MOUTH AT BEDTIME  . SYMBICORT 160-4.5 MCG/ACT inhaler INHALE 2 PUFFS BY MOUTH INTO THE LUNGS TWICE DAILY, RINSE GARGLE AND SPIT AFTER USE   Current Facility-Administered Medications for the 08/27/18 encounter (Telemedicine) with Avamae Dehaan, Reita Cliche, MD  Medication  . 0.9 %  sodium chloride infusion  . Benralizumab SOSY 30 mg     Allergies:   Colchicine; Moxifloxacin; Doxycycline; Statins; Avelox [moxifloxacin hcl in nacl]; Cefdinir; Adhesive  [tape]; and Penicillins   Social History   Tobacco Use  . Smoking status: Never Smoker  . Smokeless tobacco: Never Used  Substance Use Topics  . Alcohol use: Yes    Alcohol/week: 1.0 standard drinks    Types: 1 Glasses of wine per week  . Drug use: No     Family Hx: The patient's family history includes Alcohol abuse in his brother; Arthritis in his brother, father, and mother; Asthma in his father and mother; Breast cancer in his maternal grandmother; Diabetes in his brother, father, and paternal grandfather; Heart attack in his father; Hyperlipidemia in his brother and father; Hypertension in his father; Kidney disease in his father; Skin cancer in his mother. There is no history of Colon cancer, Esophageal cancer, Liver cancer, Pancreatic cancer, Rectal cancer, or Stomach cancer.  ROS:   Please see the history of present illness.    Records from primary care physician's office were reviewed All other systems reviewed and are negative.   Prior CV studies:   The following studies were reviewed today:  I discussed my findings with the patient's today.  Labs/Other Tests and Data Reviewed:    EKG:    Recent Labs: 05/25/2018: TSH 2.66 06/09/2018: ALT 24; BUN 27; Creatinine, Ser 1.10; Hemoglobin 16.2; Platelets 253; Potassium 3.7; Sodium 135   Recent Lipid Panel Lab Results  Component  Value Date/Time   CHOL 173 01/19/2018 10:35 AM   TRIG 168.0 (H) 01/19/2018 10:35 AM   HDL 38.10 (L) 01/19/2018 10:35 AM   CHOLHDL 5 01/19/2018 10:35 AM   LDLCALC 102 (H) 01/19/2018 10:35 AM    Wt Readings from Last 3 Encounters:  08/27/18 224 lb (101.6 kg)  06/09/18 224 lb 13.9 oz (102 kg)  05/25/18 225 lb (102.1 kg)     Objective:    Vital Signs:  BP 139/90 (BP Location: Left Arm, Patient Position: Sitting, Cuff Size: Normal)   Ht 5\' 7"  (1.702 m)   Wt 224 lb (101.6 kg)   BMI 35.08 kg/m    VITAL SIGNS:  reviewed  ASSESSMENT & PLAN:    1. Bradycardia: The patient has asymptomatic  bradycardia and I do not see any reason for any intervention at this time.  His heart rate stable and he is asymptomatic and his blood pressure is stable and I reassured him.  He has never had any dizzy episodes or any passing out spell. 2. Essential hypertension: Blood pressure is stable and will be monitored closely. 3. Patient is overweight: The importance of regular exercise stressed.  Importance of diet stressed risks of obesity explained he vocalized understanding. 4. He will be seen in follow-up appointment in 2 months or earlier if he has any concerns.  He knows to go to the nearest emergency room for any.  COVID-19 Education: The signs and symptoms of COVID-19 were discussed with the patient and how to seek care for testing (follow up with PCP or arrange E-visit).  The importance of social distancing was discussed today.  Time:   Today, I have spent 30 minutes with the patient with telehealth technology discussing the above problems.  Total time for review of records amongst other management was 45 minutes.   Medication Adjustments/Labs and Tests Ordered: Current medicines are reviewed at length with the patient today.  Concerns regarding medicines are outlined above.   Tests Ordered: No orders of the defined types were placed in this encounter.   Medication Changes: No orders of the  defined types were placed in this encounter.   Disposition:  Follow up in 2 month(s)  Signed, Jenean Lindau, MD  08/28/2018 12:29 PM    [place]

## 2018-08-28 NOTE — Telephone Encounter (Signed)
Patient just needs to scheduled  3 mo f/u with Dr. Docia Furl.

## 2018-09-24 ENCOUNTER — Other Ambulatory Visit: Payer: Self-pay | Admitting: Family

## 2018-09-24 NOTE — Telephone Encounter (Signed)
Copied from [id number]. Topic: Quick Communication - Rx Refill/Question >> Sep 24, 2018  2:11 PM Nils Flack wrote: Medication: losartan (COZAAR) 50 MG tablet  Has the patient contacted their pharmacy? Yes.   (Agent: If no, request that the patient contact the pharmacy for the refill.) (Agent: If yes, when and what did the pharmacy advise?)  Preferred Pharmacy (with phone number or street name): [facility]  Cb is [phone]   Agent: Please be advised that RX refills may take up to 3 business days. We ask that you follow-up with your pharmacy.

## 2018-09-25 MED ORDER — LOSARTAN POTASSIUM 50 MG PO TABS: ORAL_TABLET | ORAL | 1 refills | Status: DC

## 2018-09-25 NOTE — Telephone Encounter (Signed)
Refill sent to pharmacy. Notified pt.

## 2018-10-05 ENCOUNTER — Other Ambulatory Visit: Payer: Self-pay

## 2018-10-05 ENCOUNTER — Ambulatory Visit (INDEPENDENT_AMBULATORY_CARE_PROVIDER_SITE_OTHER): Payer: Managed Care, Other (non HMO)

## 2018-10-05 DIAGNOSIS — J4550 Severe persistent asthma, uncomplicated: Secondary | ICD-10-CM | POA: Diagnosis not present

## 2018-11-07 ENCOUNTER — Other Ambulatory Visit: Payer: Self-pay | Admitting: Allergy and Immunology

## 2018-11-07 DIAGNOSIS — J4550 Severe persistent asthma, uncomplicated: Secondary | ICD-10-CM

## 2018-11-15 ENCOUNTER — Other Ambulatory Visit: Payer: Self-pay

## 2018-11-15 MED ORDER — LEVOTHYROXINE SODIUM 75 MCG PO TABS: ORAL_TABLET | ORAL | 5 refills | Status: DC

## 2018-11-15 NOTE — Progress Notes (Signed)
Subjective:   PATIENT ID: Michael [name] GENDER: male DOB: [birth year], MRN: [id number]   HPI  Chief Complaint  Patient presents with  . 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  He is a [place] resident however travels often for his work.  He is a onsultant in healthcare, transitioning hospital systems to a "cloud". He travels frequently for work. In the last year, he was in [place], [place] in November and December. In December he went on a cruise and stayed in [place] during January-March 2020. In January he had a severe respiratory illness after his cruise manifested as coughing, wheezing and shortness of breath that required nebulizer use 3-4 times a day.  He did not require hospitalization.  In February he was evaluated in the ED at [facility] for a GI illness.    Reviewed and summarized ED notes from 06/09/2018: He presented with 1 day history of severe nausea vomiting and nonbloody diarrhea that resolved prior to leaving ED.  Etiology thought to be viral gastroenteritis.  Treated with IV fluids and antiemetics before discharge home.  His asthma has been previously managed by Dr. Verlin Fester an allergist.  He is currently maintained on [place], [place] and Nasacort.  He has been on [place] for 2 years and is currently receiving injections at the asthma clinic in [facility] [place] [facility] every 2 months.  Denies history of anaphylaxis.  Currently he reports nocturnal asthma symptoms of coughing, wheezing and shortness of breath that began 2 to 3 months ago.  He wakes up 2-3 times a week.  Associated symptoms include chest tightness that began 1 week ago.  Symptoms are also aggravated by  grass and trees. He enjoys gardening and has been out more often since the pandemic but feels his asthma symptoms are worsening.  He uses a travel nebulizer at work and this usually improves his symptoms.  Social History: Never smoker  Environmental exposures: Travel exposure to high risk COVID areas prior to 2020 pandemic.  No formal diagnosis of COVID  I have personally reviewed patient's past medical/family/social history, allergies, current medications.  Past Medical History:  Diagnosis Date  . Allergy   . Arthritis   . Asthma   . Cancer (HCC)    basal cell and squamous cell carcinoma  . Cancer [facility])    prostate  . Clotting disorder ([place])   . Colon polyps   . GERD (gastroesophageal reflux disease)   . Gout   . High cholesterol   . History of hepatitis    due to drug interaction?  . History of pancreatitis 2016   ?due to colchicine  . History of pulmonary embolus (PE) 2016  . History of stomach ulcers   . Hypertension   . Hypothyroidism   . OSA (obstructive sleep apnea) 2015   has CPAP  . Pneumonia   . Sleep apnea   . Thyroid disease    hypothyroid  . Urinary incontinence      Family History  Problem Relation Age of Onset  . Asthma Mother   . Arthritis Mother   . Skin cancer Mother        basal cell carcinoma  . Asthma Father   .  Arthritis Father   . Diabetes Father   . Heart attack Father   . Hyperlipidemia Father   . Hypertension Father   . Kidney disease Father   . Diabetes Brother   . Breast cancer Maternal Grandmother   . Diabetes Paternal Grandfather   . Arthritis Brother   . Hyperlipidemia Brother   . Alcohol abuse Brother   . Colon cancer Neg Hx   . Esophageal cancer Neg Hx   . Liver cancer Neg Hx   . Pancreatic cancer Neg Hx   . Rectal cancer Neg Hx   . Stomach cancer Neg Hx      Social History   Occupational History  . Not on file  Tobacco Use  . Smoking status: Never Smoker  . Smokeless tobacco: Never Used  Substance and Sexual  Activity  . Alcohol use: Yes    Alcohol/week: 1.0 standard drinks    Types: 1 Glasses of wine per week  . Drug use: No  . Sexual activity: Not on file    Allergies  Allergen Reactions  . Colchicine Other (See Comments)    Pancreatitis  . Moxifloxacin Palpitations  . Doxycycline Hives  . Statins Other (See Comments)    Joint stiffness  . Avelox [Moxifloxacin Hcl In Nacl] Other (See Comments)    SEIZURE  . Cefdinir   . Adhesive  [Tape] Other (See Comments)    "tears skin"  . Penicillins Rash     Outpatient Medications Prior to Visit  Medication Sig Dispense Refill  . albuterol (PROVENTIL HFA;VENTOLIN HFA) 108 (90 Base) MCG/ACT inhaler Inhale 2 puffs into the lungs every 6 (six) hours as needed for wheezing or shortness of breath. 1 Inhaler 1  . allopurinol (ZYLOPRIM) 100 MG tablet TAKE 1 TABLET BY MOUTH DAILY X 1 WEEK THEN 2 TABLETS BY MOUTH DAILY 120 tablet 5  . Cholecalciferol (VITAMIN D3) 5000 units CAPS Take by mouth.    . docusate sodium (COLACE) 100 MG capsule Take 200 mg by mouth daily.     Marland Kitchen EPINEPHrine 0.3 mg/0.3 mL IJ SOAJ injection Use as directed for severe allergic reactions 2 Device 1  . ipratropium-albuterol (DUONEB) 0.5-2.5 (3) MG/3ML SOLN 1 vial in nebulizer every 4-6 hours while awake for the next 3-5 days for coughing or wheezing. 360 mL 1  . levothyroxine (SYNTHROID) 75 MCG tablet TAKE 1 TABLET(75 MCG) BY MOUTH DAILY BEFORE BREAKFAST 30 tablet 5  . losartan (COZAAR) 50 MG tablet TAKE 1 TABLET(50 MG) BY MOUTH DAILY 90 tablet 1  . montelukast (SINGULAIR) 10 MG tablet TAKE 1 TABLET(10 MG) BY MOUTH AT BEDTIME 30 tablet 0  . SYMBICORT 160-4.5 MCG/ACT inhaler INHALE 2 PUFFS BY MOUTH INTO THE LUNGS TWICE DAILY, RINSE GARGLE AND SPIT AFTER USE 10.2 g 5  . fluticasone (FLOVENT HFA) 110 MCG/ACT inhaler Inhale 2 puffs into the lungs 2 (two) times daily. 1 Inhaler 5  . fexofenadine (ALLEGRA) 180 MG tablet Take 180 mg by mouth daily.     Marland Kitchen gabapentin (NEURONTIN) 100 MG  capsule TAKE 1 CAPSULE(100 MG) BY MOUTH AT BEDTIME (Patient not taking: Reported on 11/16/2018) 30 capsule 3  . INCRUSE ELLIPTA 62.5 MCG/INH AEPB INHALE 1 PUFF INTO THE LUNGS DAILY (Patient not taking: Reported on 11/16/2018) 30 each 2   Facility-Administered Medications Prior to Visit  Medication Dose Route Frequency Provider Last Rate Last Dose  . 0.9 %  sodium chloride infusion  500 mL Intravenous Once Gatha Mayer, MD      .  Benralizumab SOSY 30 mg  30 mg Subcutaneous Q8 Weeks Bobbitt, [place] Muta, MD   30 mg at 06/08/18 1028    Review of Systems  Constitutional: Negative for chills, diaphoresis, fever, malaise/fatigue and weight loss.  HENT: Negative for congestion, ear pain and sore throat.   Respiratory: Positive for cough, shortness of breath and wheezing. Negative for hemoptysis and sputum production.   Cardiovascular: Positive for leg swelling. Negative for chest pain and palpitations.  Gastrointestinal: Negative for abdominal pain, heartburn and nausea.  Genitourinary: Negative for frequency.  Musculoskeletal: Positive for joint pain and myalgias.  Skin: Negative for itching and rash.  Neurological: Negative for dizziness, weakness and headaches.  Endo/Heme/Allergies: Bruises/bleeds easily.  Psychiatric/Behavioral: Negative for depression. The patient is not nervous/anxious.      Objective:   Vitals:   11/16/18 0931  BP: 132/76  Pulse: 66  Temp: 97.9 F (36.6 C)  TempSrc: Oral  SpO2: 97%  Weight: 226 lb (102.5 kg)  Height: 5\' 6"  (1.676 m)   SpO2: 97 % O2 Device: None (Room air)  Physical Exam: General: Well-appearing, no acute distress HENT: [place], AT Eyes: EOMI, no scleral icterus Respiratory: Clear to auscultation bilaterally.  No crackles, wheezing or rales Cardiovascular: RRR, -M/R/G, no JVD GI: BS+, soft, nontender Extremities:-Edema,-tenderness Neuro: AAO x4, CNII-XII grossly intact Skin: Intact, no rashes or bruising Psych: Normal mood, normal affect   Data Reviewed:  Imaging: CXR 05/04/2018-low lung volumes with mild bibasilar atelectasis.  No effusions, edema or infiltrates  PFT: 05/11/2018 FVC 2.57 (63 %) FEV1 1.53 (51 %) Ratio 60.  Postbronchodilator response with 32% change in FVC and 11% FEV1 Interpretation: Moderately severe obstructive defect with significant bronchodilator  Labs: CBC 11/16/2018 WBC 6.3 with no evidence of eosinophils with absolute eosinophil count 0.0 IgE 11/16/2018 mildly elevated 124 (upper limit 114) Imaging, labs and tests noted above have been reviewed independently by me.    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  Previously on Xolair.  Currently on Fasenra every 2 months.  Reports worsening nocturnal symptoms despite current management on biologic agent and ICS inhaler.  Ordered CBC with differential and IgE which demonstrated well-controlled levels.  Severe persistent asthma, not an active exacerbation Transition from Fasenra injections to [facility].  Reports EpiPen up-to-date Will investigate home injection options with pharmacy rep CONTINUE Symbicort 160-4.5 mcg 2 puffs twice daily CONTINUE montelukast 10 mg daily Febrile continue PRN duo nebs as needed for shortness of breath or wheezing Asthma action plan-please contact our office for worsening symptoms for appointment.  If urgent, advised to go to ED for evaluation  Allergic rhinitis CONTINUE Allegra daily CONTINUE Nasacort daily  Health Maintenance Pneumonia - Prevanar today. Schedule for nurse visit in 8 weeks for pnemonvax Influenza 01/19/2018  Orders Placed This Encounter  Procedures  . Pneumococcal conjugate vaccine 13-valent IM (Prevnar)  . CBC with Differential/Platelet    Standing Status:   Future    Number of Occurrences:   1    Standing  Expiration Date:   11/16/2019  . IgE    Standing Status:   Future    Number of Occurrences:   1    Standing Expiration Date:   11/16/2019  No orders of the defined types were placed in this encounter.   Return in about 3 months (around 02/16/2019).  Shanine Kreiger Rodman Pickle,  MD [facility] 11/23/2018 5:10 PM  Office Number [phone]

## 2018-11-16 ENCOUNTER — Ambulatory Visit: Payer: Managed Care, Other (non HMO) | Admitting: Pulmonary Disease

## 2018-11-16 ENCOUNTER — Encounter: Payer: Self-pay | Admitting: Pulmonary Disease

## 2018-11-16 ENCOUNTER — Other Ambulatory Visit: Payer: Self-pay

## 2018-11-16 VITALS — BP 132/76 | HR 66 | Temp 97.9°F | Ht 66.0 in | Wt 226.0 lb

## 2018-11-16 DIAGNOSIS — Z23 Encounter for immunization: Secondary | ICD-10-CM | POA: Diagnosis not present

## 2018-11-16 DIAGNOSIS — J45909 Unspecified asthma, uncomplicated: Secondary | ICD-10-CM

## 2018-11-16 LAB — CBC WITH DIFFERENTIAL/PLATELET
Basophils Absolute: 0 10*3/uL (ref 0.0–0.1)
Basophils Relative: 0.2 % (ref 0.0–3.0)
Eosinophils Absolute: 0 10*3/uL (ref 0.0–0.7)
Eosinophils Relative: 0 % (ref 0.0–5.0)
HCT: 47 % (ref 39.0–52.0)
Hemoglobin: 16 g/dL (ref 13.0–17.0)
Lymphocytes Relative: 24.2 % (ref 12.0–46.0)
Lymphs Abs: 1.5 10*3/uL (ref 0.7–4.0)
MCHC: 33.9 g/dL (ref 30.0–36.0)
MCV: 96.6 fl (ref 78.0–100.0)
Monocytes Absolute: 0.7 10*3/uL (ref 0.1–1.0)
Monocytes Relative: 11.2 % (ref 3.0–12.0)
Neutro Abs: 4.1 10*3/uL (ref 1.4–7.7)
Neutrophils Relative %: 64.4 % (ref 43.0–77.0)
Platelets: 234 10*3/uL (ref 150.0–400.0)
RBC: 4.87 Mil/uL (ref 4.22–5.81)
RDW: 13.7 % (ref 11.5–15.5)
WBC: 6.3 10*3/uL (ref 4.0–10.5)

## 2018-11-16 NOTE — Progress Notes (Signed)
Please update patient on results. Good news. No evidence of eosinophils on your labs so I believe your Berna Bue is still effective.

## 2018-11-16 NOTE — Patient Instructions (Signed)
Receiving Prevnar injection today Will need appointment for Pneumovax in 8 weeks   We will contact [facility] representative to see about at home injections.   Follow up with Dr. Loanne Drilling in 3 month

## 2018-11-19 LAB — IGE: IgE (Immunoglobulin E), Serum: 124 kU/L — ABNORMAL HIGH (ref <=114)

## 2018-11-26 ENCOUNTER — Ambulatory Visit (INDEPENDENT_AMBULATORY_CARE_PROVIDER_SITE_OTHER): Payer: Managed Care, Other (non HMO) | Admitting: Family

## 2018-11-26 ENCOUNTER — Other Ambulatory Visit: Payer: Self-pay

## 2018-11-26 DIAGNOSIS — E039 Hypothyroidism, unspecified: Secondary | ICD-10-CM | POA: Diagnosis not present

## 2018-11-26 DIAGNOSIS — M1A9XX0 Chronic gout, unspecified, without tophus (tophi): Secondary | ICD-10-CM | POA: Diagnosis not present

## 2018-11-26 DIAGNOSIS — I10 Essential (primary) hypertension: Secondary | ICD-10-CM

## 2018-11-26 DIAGNOSIS — J4550 Severe persistent asthma, uncomplicated: Secondary | ICD-10-CM

## 2018-11-26 DIAGNOSIS — G4733 Obstructive sleep apnea (adult) (pediatric): Secondary | ICD-10-CM | POA: Diagnosis not present

## 2018-11-26 NOTE — Progress Notes (Signed)
Virtual Visit via Video Note  I connected with Michael [name] on 11/26/18 at  8:00 AM EDT by a video enabled telemedicine application and verified that I am speaking with the correct person using two identifiers.  Location: Patient: home Provider: home   I discussed the limitations of evaluation and management by telemedicine and the availability of in person appointments. The patient expressed understanding and agreed to proceed.  History of Present Illness:  Patient is a 68 yr old male who presents today for routine follow up.  HTN- Maintained on losartan. Reports most of his readings are with SBP in the 130s. BP Readings from Last 3 Encounters:  11/16/18 132/76  08/27/18 139/90  06/10/18 97/61   OSA-Reports that he replaced his mask and he is doing well.    Severe persistent Asthma- followed by pulmonology. Reports that his symptoms have been improved.   Hypothyroid-continues synthroid. Reports feeling better on this dose. Lab Results  Component Value Date   TSH 2.66 05/25/2018   Gout- Maintained on allopurinol. Reports occasional symptoms but he is focusing on avoiding red meat which is a trigger.  Has not been taking gabapentin.   Observations/Objective:   Gen: Awake, alert, no acute distress Resp: Breathing is even and non-labored Psych: calm/pleasant demeanor Neuro: Alert and Oriented x 3, + facial symmetry, speech is clear.   Assessment and Plan:  OSA- good compliance with cpap. Feeling well.  HTN- bp stable.  Will hold off of labs at this time due to covid.  Continue losartan.  Asthma- reports improvement with recent changes by pulmonology.  Hypothyroid- stable on synthroid, continue same.  Gout- stable on allopurinol. Continue same.    Follow Up Instructions:    I discussed the assessment and treatment plan with the patient. The patient was provided an opportunity to ask questions and all were answered. The patient agreed with the plan and  demonstrated an understanding of the instructions.   The patient was advised to call back or seek an in-person evaluation if the symptoms worsen or if the condition fails to improve as anticipated.  Nance Pear, NP

## 2018-11-30 ENCOUNTER — Other Ambulatory Visit: Payer: Self-pay

## 2018-11-30 ENCOUNTER — Ambulatory Visit (INDEPENDENT_AMBULATORY_CARE_PROVIDER_SITE_OTHER): Payer: Managed Care, Other (non HMO)

## 2018-11-30 DIAGNOSIS — J4550 Severe persistent asthma, uncomplicated: Secondary | ICD-10-CM

## 2018-12-12 ENCOUNTER — Other Ambulatory Visit: Payer: Self-pay | Admitting: *Deleted

## 2018-12-12 DIAGNOSIS — J4550 Severe persistent asthma, uncomplicated: Secondary | ICD-10-CM

## 2018-12-27 ENCOUNTER — Encounter: Payer: Self-pay | Admitting: Family

## 2018-12-27 DIAGNOSIS — J4550 Severe persistent asthma, uncomplicated: Secondary | ICD-10-CM

## 2018-12-27 MED ORDER — MONTELUKAST SODIUM 10 MG PO TABS: ORAL_TABLET | ORAL | 5 refills | Status: DC

## 2019-01-24 ENCOUNTER — Other Ambulatory Visit: Payer: Self-pay

## 2019-01-25 ENCOUNTER — Other Ambulatory Visit: Payer: Self-pay

## 2019-01-25 ENCOUNTER — Ambulatory Visit (INDEPENDENT_AMBULATORY_CARE_PROVIDER_SITE_OTHER): Payer: Managed Care, Other (non HMO)

## 2019-01-25 DIAGNOSIS — J4550 Severe persistent asthma, uncomplicated: Secondary | ICD-10-CM

## 2019-01-29 ENCOUNTER — Other Ambulatory Visit: Payer: Self-pay

## 2019-01-29 ENCOUNTER — Ambulatory Visit (INDEPENDENT_AMBULATORY_CARE_PROVIDER_SITE_OTHER): Payer: Managed Care, Other (non HMO)

## 2019-01-29 DIAGNOSIS — Z23 Encounter for immunization: Secondary | ICD-10-CM | POA: Diagnosis not present

## 2019-03-06 ENCOUNTER — Other Ambulatory Visit: Payer: Self-pay | Admitting: Allergy and Immunology

## 2019-03-20 ENCOUNTER — Ambulatory Visit (INDEPENDENT_AMBULATORY_CARE_PROVIDER_SITE_OTHER): Payer: Managed Care, Other (non HMO)

## 2019-03-20 DIAGNOSIS — J4550 Severe persistent asthma, uncomplicated: Secondary | ICD-10-CM

## 2019-03-22 ENCOUNTER — Ambulatory Visit: Payer: Self-pay

## 2019-03-26 ENCOUNTER — Encounter: Payer: Self-pay | Admitting: Family

## 2019-03-27 MED ORDER — LOSARTAN POTASSIUM 50 MG PO TABS: ORAL_TABLET | ORAL | 1 refills | Status: DC

## 2019-04-08 ENCOUNTER — Other Ambulatory Visit: Payer: Self-pay | Admitting: Allergy and Immunology

## 2019-04-12 ENCOUNTER — Other Ambulatory Visit: Payer: Self-pay

## 2019-04-12 ENCOUNTER — Encounter: Payer: Self-pay | Admitting: Allergy

## 2019-04-12 ENCOUNTER — Ambulatory Visit (INDEPENDENT_AMBULATORY_CARE_PROVIDER_SITE_OTHER): Payer: Managed Care, Other (non HMO) | Admitting: Allergy

## 2019-04-12 VITALS — BP 126/90 | HR 58 | Temp 97.9°F | Resp 16

## 2019-04-12 DIAGNOSIS — J4550 Severe persistent asthma, uncomplicated: Secondary | ICD-10-CM | POA: Diagnosis not present

## 2019-04-12 DIAGNOSIS — J984 Other disorders of lung: Secondary | ICD-10-CM

## 2019-04-12 DIAGNOSIS — T39015A Adverse effect of aspirin, initial encounter: Secondary | ICD-10-CM

## 2019-04-12 DIAGNOSIS — J339 Nasal polyp, unspecified: Secondary | ICD-10-CM | POA: Insufficient documentation

## 2019-04-12 DIAGNOSIS — J3089 Other allergic rhinitis: Secondary | ICD-10-CM

## 2019-04-12 DIAGNOSIS — T781XXD Other adverse food reactions, not elsewhere classified, subsequent encounter: Secondary | ICD-10-CM

## 2019-04-12 DIAGNOSIS — J0100 Acute maxillary sinusitis, unspecified: Secondary | ICD-10-CM | POA: Diagnosis not present

## 2019-04-12 DIAGNOSIS — T7819XA Other adverse food reactions, not elsewhere classified, initial encounter: Secondary | ICD-10-CM | POA: Insufficient documentation

## 2019-04-12 DIAGNOSIS — Z8709 Personal history of other diseases of the respiratory system: Secondary | ICD-10-CM | POA: Diagnosis not present

## 2019-04-12 DIAGNOSIS — Z886 Allergy status to analgesic agent status: Secondary | ICD-10-CM

## 2019-04-12 DIAGNOSIS — T781XXA Other adverse food reactions, not elsewhere classified, initial encounter: Secondary | ICD-10-CM | POA: Insufficient documentation

## 2019-04-12 MED ORDER — DULERA 200-5 MCG/ACT IN AERO: 2.0000 | INHALATION_SPRAY | Freq: Two times a day (BID) | RESPIRATORY_TRACT | 5 refills | Status: DC

## 2019-04-12 MED ORDER — EPINEPHRINE 0.3 MG/0.3ML IJ SOAJ: INTRAMUSCULAR | 3 refills | Status: DC

## 2019-04-12 MED ORDER — MONTELUKAST SODIUM 10 MG PO TABS: ORAL_TABLET | ORAL | 5 refills | Status: DC

## 2019-04-12 MED ORDER — INCRUSE ELLIPTA 62.5 MCG/INH IN AEPB: 1.0000 | INHALATION_SPRAY | Freq: Every day | RESPIRATORY_TRACT | 5 refills | Status: DC

## 2019-04-12 MED ORDER — SULFAMETHOXAZOLE-TRIMETHOPRIM 800-160 MG PO TABS: 1.0000 | ORAL_TABLET | Freq: Two times a day (BID) | ORAL | 0 refills | Status: DC

## 2019-04-12 NOTE — Patient Instructions (Addendum)
Asthma:  Continue Fasenra injections every 8 weeks.  Use your albuterol nebulizer twice a day to open up the lungs for the next few days before using the [facility] inhaler.  If Ruthe Mannan is not covered then let us know.   Daily controller medication(s): start Dulera 200 2 puffs twice a day with spacer and rinse mouth afterwards.  Continue Incruse 1 puff daily.  Continue Singulair 10mg  daily.  Prior to physical activity:May use albuterol rescue inhaler 2 puffs 5 to 15 minutes prior to strenuous physical activities.  Rescue medications:May use albuterol rescue inhaler 2 puffs or nebulizer every 4 to 6 hours as needed for shortness of breath, chest tightness, coughing, and wheezing. Monitor frequency of use.   During upper respiratory infections: Start Flovent 110 2 puffs twice a day with spacer and rinse mouth afterwards for 1-2 weeks.  Asthma control goals:  Full participation in all desired activities (may need albuterol before activity) Albuterol use two times or less a week on average (not counting use with activity) Cough interfering with sleep two times or less a month Oral steroids no more than once a year No hospitalizations  Acute maxillary sinusitis  Continue Nasacort 2 spray twice a day.  Nasal saline spray (i.e., Simply Saline) or nasal saline lavage (i.e., NeilMed) is recommended as needed and prior to medicated nasal sprays.  Take Mucinex 600mg -1200mg  twice a day for thick discharge and take with plenty of water.  Start bactrim DS antibiotic 1 pill twice a day for 10 days.  Aspirin sensitivity  Continue to avoid NSAID products.  History of nasal polyp  Continue Nasacort 2 sprays twice a day.   Allergic rhinitis  Consider retesting in future.  Food allergy  Continue avoid peanuts and oregano.   For mild symptoms you can take over the counter antihistamines such as Benadryl and monitor symptoms closely. If symptoms worsen or if you have severe symptoms  including breathing issues, throat closure, significant swelling, whole body hives, severe diarrhea and vomiting, lightheadedness then inject epinephrine and seek immediate medical care afterwards.  Consider testing in future.   Follow up in 3 months.

## 2019-04-12 NOTE — Assessment & Plan Note (Signed)
   No recent allergy testing. Consider retesting in future.  See assessment and plan as above.

## 2019-04-12 NOTE — Assessment & Plan Note (Signed)
Increased sinus symptoms with yellow drainage x 2 weeks. Most likely has another sinus infection.   Continue Nasacort 2 spray twice a day.  Nasal saline spray (i.e., Simply Saline) or nasal saline lavage (i.e., NeilMed) is recommended as needed and prior to medicated nasal sprays.  Take Mucinex 600mg -1200mg  twice a day for thick discharge and take with plenty of water.  Start bactrim DS antibiotic 1 pill twice a day for 10 days.

## 2019-04-12 NOTE — Progress Notes (Signed)
Follow Up Note  RE: Michael [name] MRN: [id number] DOB: 05-Oct-1950 Date of Office Visit: 04/12/2019  Referring provider: Debbrah Alar, NP Primary care provider: Debbrah Alar, NP  Chief Complaint: Asthma (coughing at night)  History of Present Illness: I had the pleasure of seeing Michael [name] for a follow up visit at the [facility] [place] [facility] [place] on 04/12/2019. He is a 68 y.o. male, who is being followed for asthma, AERD, allergic rhinitis. His previous allergy office visit was on 05/11/2018 with Dr. Maudie [name]. Today is a regular follow up visit.  Currently on Fasenra injections every 8 weeks with good benefit.  No steroid since January 2020.   Patient has been doing well up until a few weeks ago. He noticed some coughing at night which has been waking him up at night and used albuterol nightly the last 2 weeks with some benefit.  Patient also has some PND and chest tightness.  Denies any fevers or chills or sick contacts. No oral steroids or antibiotics for this yet.   Some mild yellow sinus drainage. Using saline rinse with some benefit. Feels like he has a sinus infection. In the past zpak was ineffective.   Patient started on Incruse Ellipta about 6 months ago - his insurance stopped covering Spiriva and Symbicort. At that time he did not have worsening symptoms but his asthma usually flares in the fall and winter. He did notice that his albuterol HFA was not as effective though at times.   Only taking Flovent occasionally and not on any maintenance ICS inhaler.   Patient wears a CPAP machine.   Avoiding aspirin. Still doing Nasacort 2 spray twice a day.   Had an episode of throat tightness after eating peanuts and had to take benadryl. He tolerates tree nuts with no issues.   Assessment and Plan: Michael [name] is a 68 y.o. male with: Severe persistent asthma Was doing well but about 6 months ago insurance stopped covering some of his medications so only  on Incruse daily and Fasenra injections. Using albuterol a little more the last few weeks.  Today's ACT score was 18.  His asthma is not well controlled most likely due to the fact that he stopped Symbicort however symptoms not bad enough to warrant oral steroids at this time.   Continue Fasenra injections every 8 weeks.  Use albuterol nebulizer twice a day to open up the lungs for the next few days before using the [facility] inhaler.  If Ruthe Mannan is not covered then let us know.   Daily controller medication(s): start Dulera 200 2 puffs twice a day with spacer and rinse mouth afterwards.  Continue Incruse 1 puff daily.  Continue Singulair 10mg  daily.  Prior to physical activity:May use albuterol rescue inhaler 2 puffs 5 to 15 minutes prior to strenuous physical activities.  Rescue medications:May use albuterol rescue inhaler 2 puffs or nebulizer every 4 to 6 hours as needed for shortness of breath, chest tightness, coughing, and wheezing. Monitor frequency of use.   During upper respiratory infections: Start Flovent 110 2 puffs twice a day with spacer and rinse mouth afterwards for 1-2 weeks.   Will get spirometry at next visit instead of today due to COVID-19 pandemic and trying to minimize any type of aerosolizing procedures at this time in the office.   Disorder of respiratory system exacerbated by aspirin Patient has clinical history of AERD.  Continue to avoid NSAIDs.  Acute maxillary sinusitis Increased sinus symptoms with yellow drainage x  2 weeks. Most likely has another sinus infection.   Continue Nasacort 2 spray twice a day.  Nasal saline spray (i.e., Simply Saline) or nasal saline lavage (i.e., NeilMed) is recommended as needed and prior to medicated nasal sprays.  Take Mucinex 600mg -1200mg  twice a day for thick discharge and take with plenty of water.  Start bactrim DS antibiotic 1 pill twice a day for 10 days.  History of nasal polyp  Continue Nasacort 2 sprays  twice a day.   Allergic rhinitis  No recent allergy testing. Consider retesting in future.  See assessment and plan as above.   Adverse food reaction Throat tightness after eating peanuts. Tolerates tree nuts.  Continue avoid peanuts and oregano.   Offered bloodwork and/or skin prick testing but patient declines at this time.   For mild symptoms you can take over the counter antihistamines such as Benadryl and monitor symptoms closely. If symptoms worsen or if you have severe symptoms including breathing issues, throat closure, significant swelling, whole body hives, severe diarrhea and vomiting, lightheadedness then inject epinephrine and seek immediate medical care afterwards.  Consider testing in future.   Return in about 3 months (around 07/11/2019).  Meds ordered this encounter  Medications  . montelukast (SINGULAIR) 10 MG tablet    Sig: TAKE 1 TABLET(10 MG) BY MOUTH AT BEDTIME    Dispense:  30 tablet    Refill:  5  . umeclidinium bromide (INCRUSE ELLIPTA) 62.5 MCG/INH AEPB    Sig: Inhale 1 puff into the lungs daily.    Dispense:  30 each    Refill:  5  . EPINEPHrine 0.3 mg/0.3 mL IJ SOAJ injection    Sig: Use as directed for severe allergic reactions    Dispense:  4 each    Refill:  3  . mometasone-formoterol (DULERA) 200-5 MCG/ACT AERO    Sig: Inhale 2 puffs into the lungs 2 (two) times daily.    Dispense:  13 g    Refill:  5  . sulfamethoxazole-trimethoprim (BACTRIM DS) 800-160 MG tablet    Sig: Take 1 tablet by mouth 2 (two) times daily.    Dispense:  20 tablet    Refill:  0   Diagnostics: None.  Medication List:  Current Outpatient Medications  Medication Sig Dispense Refill  . allopurinol (ZYLOPRIM) 100 MG tablet TAKE 1 TABLET BY MOUTH DAILY X 1 WEEK THEN 2 TABLETS BY MOUTH DAILY 120 tablet 5  . calcium carbonate (OS-CAL - DOSED IN MG OF ELEMENTAL CALCIUM) 1250 (500 Ca) MG tablet Take 1 tablet by mouth daily.    . Cholecalciferol (VITAMIN D3) 5000 units  CAPS Take by mouth.    . docusate sodium (COLACE) 100 MG capsule Take 200 mg by mouth daily.     Marland Kitchen EPINEPHrine 0.3 mg/0.3 mL IJ SOAJ injection Use as directed for severe allergic reactions 4 each 3  . FASENRA 30 MG/ML SOSY     . fexofenadine (ALLEGRA) 180 MG tablet Take 180 mg by mouth daily.     Marland Kitchen ipratropium-albuterol (DUONEB) 0.5-2.5 (3) MG/3ML SOLN 1 vial in nebulizer every 4-6 hours while awake for the next 3-5 days for coughing or wheezing. 360 mL 1  . levothyroxine (SYNTHROID) 75 MCG tablet TAKE 1 TABLET(75 MCG) BY MOUTH DAILY BEFORE BREAKFAST 30 tablet 5  . losartan (COZAAR) 50 MG tablet TAKE 1 TABLET(50 MG) BY MOUTH DAILY 90 tablet 1  . Magnesium 250 MG TABS Take by mouth.    . montelukast (SINGULAIR) 10 MG tablet TAKE  1 TABLET(10 MG) BY MOUTH AT BEDTIME 30 tablet 5  . umeclidinium bromide (INCRUSE ELLIPTA) 62.5 MCG/INH AEPB Inhale 1 puff into the lungs daily. 30 each 5  . VENTOLIN HFA 108 (90 Base) MCG/ACT inhaler INHALE 2 PUFFS INTO THE LUNGS EVERY 6 HOURS AS NEEDED FOR WHEEZING OR SHORTNESS OF BREATH 18 g 0  . mometasone-formoterol (DULERA) 200-5 MCG/ACT AERO Inhale 2 puffs into the lungs 2 (two) times daily. 13 g 5  . sulfamethoxazole-trimethoprim (BACTRIM DS) 800-160 MG tablet Take 1 tablet by mouth 2 (two) times daily. 20 tablet 0   Current Facility-Administered Medications  Medication Dose Route Frequency Provider Last Rate Last Admin  . 0.9 %  sodium chloride infusion  500 mL Intravenous Once Gatha Mayer, MD      . Benralizumab SOSY 30 mg  30 mg Subcutaneous Q8 Weeks Bobbitt, [place] Muta, MD   30 mg at 03/20/19 [id number]   Allergies: Allergies  Allergen Reactions  . Colchicine Other (See Comments)    Pancreatitis  . Doxycycline Hives  . Statins Other (See Comments)    Joint stiffness  . Avelox [Moxifloxacin Hcl In Nacl] Other (See Comments)    SEIZURE  . Cefdinir Hives  . Oregano [Origanum Oil] Cough  . Peanut-Containing Drug Products Swelling    Throat swelling  .  Adhesive  [Tape] Other (See Comments)    "tears skin"  . Penicillins Rash   I reviewed his past medical history, social history, family history, and environmental history and no significant changes have been reported from his previous visit.  Review of Systems  Constitutional: Negative for appetite change, chills, fever and unexpected weight change.  HENT: Positive for congestion, postnasal drip, rhinorrhea and sinus pressure.   Eyes: Negative for itching.  Respiratory: Positive for cough and chest tightness. Negative for shortness of breath and wheezing.   Gastrointestinal: Negative for abdominal pain.  Skin: Negative for rash.  Allergic/Immunologic: Positive for environmental allergies.  Neurological: Negative for headaches.   Objective: BP 126/90   Pulse (!) 58   Temp 97.9 F (36.6 C) (Oral)   Resp 16   SpO2 99%  There is no height or weight on file to calculate BMI. Physical Exam  Constitutional: He is oriented to person, place, and time. He appears well-developed and well-nourished.  HENT:  Head: Normocephalic and atraumatic.  Right Ear: External ear normal.  Left Ear: External ear normal.  Nose: Nose normal.  Mouth/Throat: Oropharynx is clear and moist.  Eyes: Conjunctivae and EOM are normal.  Cardiovascular: Normal rate, regular rhythm and normal heart sounds. Exam reveals no gallop and no friction rub.  No murmur heard. Pulmonary/Chest: Effort normal. He has no wheezes. He has no rales.  Musculoskeletal:     Cervical back: Neck supple.  Neurological: He is alert and oriented to person, place, and time.  Skin: Skin is warm. No rash noted.  Psychiatric: He has a normal mood and affect. His behavior is normal.  Nursing note and vitals reviewed.  Previous notes and tests were reviewed. The plan was reviewed with the patient/family, and all questions/concerned were addressed.  It was my pleasure to see Michael [name] today and participate in his care. Please feel free to  contact me with any questions or concerns.  Sincerely,  Rexene Alberts, DO Allergy & Immunology  [facility] office: [phone] [facility] office: [place] office: [phone]

## 2019-04-12 NOTE — Assessment & Plan Note (Signed)
   Continue Nasacort 2 sprays twice a day.

## 2019-04-12 NOTE — Assessment & Plan Note (Signed)
Was doing well but about 6 months ago insurance stopped covering some of his medications so only on Incruse daily and Fasenra injections. Using albuterol a little more the last few weeks.  Today's ACT score was 18.  His asthma is not well controlled most likely due to the fact that he stopped Symbicort however symptoms not bad enough to warrant oral steroids at this time.   Continue Fasenra injections every 8 weeks.  Use albuterol nebulizer twice a day to open up the lungs for the next few days before using the [facility] inhaler.  If Ruthe Mannan is not covered then let us know.   Daily controller medication(s): start Dulera 200 2 puffs twice a day with spacer and rinse mouth afterwards.  Continue Incruse 1 puff daily.  Continue Singulair 10mg  daily.  Prior to physical activity:May use albuterol rescue inhaler 2 puffs 5 to 15 minutes prior to strenuous physical activities.  Rescue medications:May use albuterol rescue inhaler 2 puffs or nebulizer every 4 to 6 hours as needed for shortness of breath, chest tightness, coughing, and wheezing. Monitor frequency of use.   During upper respiratory infections: Start Flovent 110 2 puffs twice a day with spacer and rinse mouth afterwards for 1-2 weeks.   Will get spirometry at next visit instead of today due to COVID-19 pandemic and trying to minimize any type of aerosolizing procedures at this time in the office.

## 2019-04-12 NOTE — Assessment & Plan Note (Signed)
Throat tightness after eating peanuts. Tolerates tree nuts.  Continue avoid peanuts and oregano.   Offered bloodwork and/or skin prick testing but patient declines at this time.   For mild symptoms you can take over the counter antihistamines such as Benadryl and monitor symptoms closely. If symptoms worsen or if you have severe symptoms including breathing issues, throat closure, significant swelling, whole body hives, severe diarrhea and vomiting, lightheadedness then inject epinephrine and seek immediate medical care afterwards.  Consider testing in future.

## 2019-04-12 NOTE — Assessment & Plan Note (Signed)
Patient has clinical history of AERD.  Continue to avoid NSAIDs. 

## 2019-05-15 ENCOUNTER — Ambulatory Visit: Payer: Self-pay

## 2019-05-17 ENCOUNTER — Ambulatory Visit: Payer: Self-pay

## 2019-05-17 ENCOUNTER — Ambulatory Visit (INDEPENDENT_AMBULATORY_CARE_PROVIDER_SITE_OTHER): Payer: Managed Care, Other (non HMO)

## 2019-05-17 DIAGNOSIS — J309 Allergic rhinitis, unspecified: Secondary | ICD-10-CM

## 2019-05-17 DIAGNOSIS — J4550 Severe persistent asthma, uncomplicated: Secondary | ICD-10-CM

## 2019-05-19 ENCOUNTER — Encounter: Payer: Self-pay | Admitting: Family

## 2019-05-19 DIAGNOSIS — M109 Gout, unspecified: Secondary | ICD-10-CM

## 2019-05-20 MED ORDER — LEVOTHYROXINE SODIUM 75 MCG PO TABS: ORAL_TABLET | ORAL | 0 refills | Status: DC

## 2019-05-20 MED ORDER — ALLOPURINOL 100 MG PO TABS: ORAL_TABLET | ORAL | 0 refills | Status: DC

## 2019-05-28 ENCOUNTER — Encounter: Payer: Self-pay | Admitting: Family

## 2019-05-29 ENCOUNTER — Other Ambulatory Visit: Payer: Self-pay | Admitting: Family

## 2019-05-29 ENCOUNTER — Encounter: Payer: Self-pay | Admitting: Family

## 2019-05-29 MED ORDER — CLINDAMYCIN HCL 300 MG PO CAPS: 300.0000 mg | ORAL_CAPSULE | Freq: Three times a day (TID) | ORAL | 0 refills | Status: DC

## 2019-07-11 NOTE — Progress Notes (Signed)
Follow Up Note  RE: Michael [name] MRN: [id number] DOB: 1950-11-11 Date of Office Visit: 07/12/2019  Referring provider: Debbrah Alar, NP Primary care provider: Debbrah Alar, NP  Chief Complaint: Asthma and Allergic Rhinitis   History of Present Illness: I had the pleasure of seeing Michael [name] for a follow up visit at the [facility] [place] [facility] [place] on 07/12/2019. He is a 69 y.o. male, who is being followed for asthma, AERD, history of nasal polyp, rhinitis and adverse food reaction. His previous allergy office visit was on 04/12/2019 with Dr. Maudie Mercury. Today is a regular follow up visit.  Severe persistent asthma Currently on Dulera 200 2 puffs BID with spacer and rinsing mouth afterwards. Noticed significant improvement in his breathing.  Still using Incruse and Singulair. Currently on Fasenra injections every 8 weeks but noticed symptoms around week 7 with increased sensitivity to environmental allergies and his skin starts to itchy more.   Otherwise denies any SOB, coughing, wheezing, chest tightness, nocturnal awakenings, ER/urgent care visits or prednisone use since the last visit. No albuterol use recently.   Sometimes has nocturnal awakening once a week at night. Usually more symptomatic when he is outdoors more during the daytime.  He has sleep apnea and is on CPAP.   Disorder of respiratory system exacerbated by aspirin Avoiding aspirin products.  History of nasal polyp/allergic rhinitis Currently on Nasacort 2 sprays twice a day and doing saline rinses.  No fevers or chills.   Noticing some nasal congestion.   Drug reaction: Patient had reaction to amoxicillin about 5 years ago for sinus infection and within a few hours developed hives on the neck.  Resolved within a few hours after benadryl.  He also had a rash with the bactrim.   Adverse food reaction Currently avoiding peanuts and oregano. No reactions since the last visit.    Assessment and Plan: Michael [name] is a 69 y.o. male with: Severe persistent asthma Past history - Xolair was ineffective. Interim history - Doing much better with the addition of Dulera. Noting some allergic symptoms the week before due for Fasenra.  Today's spirometry showed mild obstruction. Much improved from last visit.   Discussed switching from [place] to [place] as patient also has history of nasal polyps and noting that Berna Bue not lasting the full 8 weeks. Gave handout.   Patient interested in self-injections at home.   If Dupixent is not covered then will try to get Fasenra injections every 7 weeks instead of 8 weeks.  Daily controller medication(s): continue Dulera 200 2 puffs twice a day with spacer and rinse mouth afterwards. ? Continue Incruse 1 puff daily. ? Continue Singulair 10mg  daily.  Prior to physical activity:May use albuterol rescue inhaler 2 puffs 5 to 15 minutes prior to strenuous physical activities.  Rescue medications:May use albuterol rescue inhaler 2 puffs or nebulizer every 4 to 6 hours as needed for shortness of breath, chest tightness, coughing, and wheezing. Monitor frequency of use.   During upper respiratory infections: Start Flovent 110 2 puffs twice a day with spacer and rinse mouth afterwards for 1-2 weeks.   History of nasal polyp  Start Xhance 2 sprays twice a day. Sample given. Demonstrated proper use.  This replaces Nasacort for now.   Continue with saline irrigations as needed.   Disorder of respiratory system exacerbated by aspirin Patient has clinical history of AERD.  Continue to avoid NSAIDs.  Allergic rhinitis Past history - No recent allergy testing.  Interim history - some  nasal congestion.   Consider retesting in future.  See assessment and plan as above.   Adverse food reaction Past history - Throat tightness after eating peanuts. Tolerates tree nuts.  Continue avoid peanuts and oregano.   For mild symptoms you  can take over the counter antihistamines such as Benadryl and monitor symptoms closely. If symptoms worsen or if you have severe symptoms including breathing issues, throat closure, significant swelling, whole body hives, severe diarrhea and vomiting, lightheadedness then inject epinephrine and seek immediate medical care afterwards.  Consider testing in future.   Allergy to multiple antibiotics Patient has multiple antibiotic allergies. Most recently the bactrim caused rash. Currently on clindamycin for a tooth infection with no issues. History of hives with amoxicillin about 5 years ago. Resolved with benadryl.   Schedule for penicillin skin testing and challenge.  Return in about 2 months (around 09/11/2019) for Drug challenge.  Meds ordered this encounter  Medications  . Fluticasone Propionate (XHANCE) 93 MCG/ACT EXHU    Sig: Place 2 sprays into both nostrils in the morning and at bedtime.    Dispense:  16 mL    Refill:  12   Diagnostics: Spirometry:  Tracings reviewed. His effort: Good reproducible efforts. FVC: 3.25L FEV1: 2.20L, 74% predicted FEV1/FVC ratio: 68% Interpretation: Spirometry consistent with mild obstructive disease.  Please see scanned spirometry results for details.  Medication List:  Current Outpatient Medications  Medication Sig Dispense Refill  . allopurinol (ZYLOPRIM) 100 MG tablet TAKE 1 TABLET BY MOUTH DAILY X 1 WEEK THEN 2 TABLETS BY MOUTH DAILY 180 tablet 0  . calcium carbonate (OS-CAL - DOSED IN MG OF ELEMENTAL CALCIUM) 1250 (500 Ca) MG tablet Take 1 tablet by mouth daily.    . chlorhexidine (PERIDEX) 0.12 % solution SMARTSIG:0.5 Capful(s) By Mouth Every Night    . Cholecalciferol (VITAMIN D3) 5000 units CAPS Take by mouth.    . docusate sodium (COLACE) 100 MG capsule Take 200 mg by mouth daily.     Marland Kitchen EPINEPHrine 0.3 mg/0.3 mL IJ SOAJ injection Use as directed for severe allergic reactions 4 each 3  . FASENRA 30 MG/ML SOSY     . fexofenadine  (ALLEGRA) 180 MG tablet Take 180 mg by mouth daily.     Marland Kitchen ipratropium-albuterol (DUONEB) 0.5-2.5 (3) MG/3ML SOLN 1 vial in nebulizer every 4-6 hours while awake for the next 3-5 days for coughing or wheezing. 360 mL 1  . levothyroxine (SYNTHROID) 75 MCG tablet TAKE 1 TABLET(75 MCG) BY MOUTH DAILY BEFORE BREAKFAST 90 tablet 0  . losartan (COZAAR) 50 MG tablet TAKE 1 TABLET(50 MG) BY MOUTH DAILY 90 tablet 1  . Magnesium 250 MG TABS Take by mouth.    . mometasone-formoterol (DULERA) 200-5 MCG/ACT AERO Inhale 2 puffs into the lungs 2 (two) times daily. 13 g 5  . montelukast (SINGULAIR) 10 MG tablet TAKE 1 TABLET(10 MG) BY MOUTH AT BEDTIME 30 tablet 5  . umeclidinium bromide (INCRUSE ELLIPTA) 62.5 MCG/INH AEPB Inhale 1 puff into the lungs daily. 30 each 5  . VENTOLIN HFA 108 (90 Base) MCG/ACT inhaler INHALE 2 PUFFS INTO THE LUNGS EVERY 6 HOURS AS NEEDED FOR WHEEZING OR SHORTNESS OF BREATH 18 g 0  . clindamycin (CLEOCIN) 300 MG capsule Take 1 capsule (300 mg total) by mouth 3 (three) times daily. (Patient not taking: Reported on 07/12/2019) 21 capsule 0  . Fluticasone Propionate (XHANCE) 93 MCG/ACT EXHU Place 2 sprays into both nostrils in the morning and at bedtime. 16 mL 12  Current Facility-Administered Medications  Medication Dose Route Frequency Provider Last Rate Last Admin  . 0.9 %  sodium chloride infusion  500 mL Intravenous Once Gatha Mayer, MD      . Benralizumab SOSY 30 mg  30 mg Subcutaneous Q8 Weeks Bobbitt, [place] Muta, MD   30 mg at 05/17/19 1240   Allergies: Allergies  Allergen Reactions  . Colchicine Other (See Comments)    Pancreatitis  . Doxycycline Hives  . Statins Other (See Comments)    Joint stiffness  . Avelox [Moxifloxacin Hcl In Nacl] Other (See Comments)    SEIZURE  . Bactrim [Sulfamethoxazole-Trimethoprim] Hives  . Cefdinir Hives  . Oregano [Origanum Oil] Cough  . Peanut-Containing Drug Products Swelling    Throat swelling  . Adhesive  [Tape] Other (See  Comments)    "tears skin"  . Penicillins Rash   I reviewed his past medical history, social history, family history, and environmental history and no significant changes have been reported from his previous visit.  Review of Systems  Constitutional: Negative for appetite change, chills, fever and unexpected weight change.  HENT: Positive for congestion and sinus pressure. Negative for postnasal drip and rhinorrhea.   Eyes: Negative for itching.  Respiratory: Negative for cough, chest tightness, shortness of breath and wheezing.   Gastrointestinal: Negative for abdominal pain.  Skin: Negative for rash.  Allergic/Immunologic: Positive for environmental allergies and food allergies.  Neurological: Negative for headaches.   Objective: BP 120/76 (BP Location: Right Arm, Patient Position: Sitting, Cuff Size: Large)   Pulse 62   Temp 98.2 F (36.8 C) (Oral)   Ht 5\' 6"  (1.676 m)   Wt 230 lb (104.3 kg)   BMI 37.12 kg/m  Body mass index is 37.12 kg/m. Physical Exam  Constitutional: He is oriented to person, place, and time. He appears well-developed and well-nourished.  HENT:  Head: Normocephalic and atraumatic.  Right Ear: External ear normal.  Left Ear: External ear normal.  Nose: Nose normal.  Mouth/Throat: Oropharynx is clear and moist.  Eyes: Conjunctivae and EOM are normal.  Cardiovascular: Normal rate, regular rhythm and normal heart sounds. Exam reveals no gallop and no friction rub.  No murmur heard. Pulmonary/Chest: Effort normal. He has no wheezes. He has no rales.  Musculoskeletal:     Cervical back: Neck supple.  Neurological: He is alert and oriented to person, place, and time.  Skin: Skin is warm. No rash noted.  Psychiatric: He has a normal mood and affect. His behavior is normal.  Nursing note and vitals reviewed.  Previous notes and tests were reviewed. The plan was reviewed with the patient/family, and all questions/concerned were addressed.  It was my  pleasure to see Michael [name] today and participate in his care. Please feel free to contact me with any questions or concerns.  Sincerely,  Rexene Alberts, DO Allergy & Immunology  [facility] office: [phone] [facility] office: [place] office: [phone]

## 2019-07-12 ENCOUNTER — Ambulatory Visit (INDEPENDENT_AMBULATORY_CARE_PROVIDER_SITE_OTHER): Payer: Managed Care, Other (non HMO)

## 2019-07-12 ENCOUNTER — Encounter: Payer: Self-pay | Admitting: Allergy

## 2019-07-12 ENCOUNTER — Other Ambulatory Visit: Payer: Self-pay

## 2019-07-12 ENCOUNTER — Ambulatory Visit: Payer: Managed Care, Other (non HMO) | Admitting: Allergy

## 2019-07-12 VITALS — BP 120/76 | HR 62 | Temp 98.2°F | Ht 66.0 in | Wt 230.0 lb

## 2019-07-12 DIAGNOSIS — Z8709 Personal history of other diseases of the respiratory system: Secondary | ICD-10-CM

## 2019-07-12 DIAGNOSIS — J4550 Severe persistent asthma, uncomplicated: Secondary | ICD-10-CM | POA: Diagnosis not present

## 2019-07-12 DIAGNOSIS — T781XXD Other adverse food reactions, not elsewhere classified, subsequent encounter: Secondary | ICD-10-CM

## 2019-07-12 DIAGNOSIS — Z881 Allergy status to other antibiotic agents status: Secondary | ICD-10-CM

## 2019-07-12 DIAGNOSIS — J984 Other disorders of lung: Secondary | ICD-10-CM | POA: Diagnosis not present

## 2019-07-12 DIAGNOSIS — Z886 Allergy status to analgesic agent status: Secondary | ICD-10-CM

## 2019-07-12 DIAGNOSIS — J3089 Other allergic rhinitis: Secondary | ICD-10-CM

## 2019-07-12 DIAGNOSIS — T39015A Adverse effect of aspirin, initial encounter: Secondary | ICD-10-CM

## 2019-07-12 MED ORDER — XHANCE 93 MCG/ACT NA EXHU: 2.0000 | INHALANT_SUSPENSION | Freq: Two times a day (BID) | NASAL | 12 refills | Status: DC

## 2019-07-12 NOTE — Assessment & Plan Note (Signed)
Past history - Xolair was ineffective. Interim history - Doing much better with the addition of Dulera. Noting some allergic symptoms the week before due for Fasenra.  Today's spirometry showed mild obstruction. Much improved from last visit.   Discussed switching from [place] to [place] as patient also has history of nasal polyps and noting that Berna Bue not lasting the full 8 weeks. Gave handout.   Patient interested in self-injections at home.   If Dupixent is not covered then will try to get Fasenra injections every 7 weeks instead of 8 weeks.  Daily controller medication(s): continue Dulera 200 2 puffs twice a day with spacer and rinse mouth afterwards. ? Continue Incruse 1 puff daily. ? Continue Singulair 10mg  daily.  Prior to physical activity:May use albuterol rescue inhaler 2 puffs 5 to 15 minutes prior to strenuous physical activities.  Rescue medications:May use albuterol rescue inhaler 2 puffs or nebulizer every 4 to 6 hours as needed for shortness of breath, chest tightness, coughing, and wheezing. Monitor frequency of use.   During upper respiratory infections: Start Flovent 110 2 puffs twice a day with spacer and rinse mouth afterwards for 1-2 weeks.

## 2019-07-12 NOTE — Assessment & Plan Note (Signed)
Patient has clinical history of AERD.  Continue to avoid NSAIDs. 

## 2019-07-12 NOTE — Assessment & Plan Note (Signed)
   Start Xhance 2 sprays twice a day. Sample given. Demonstrated proper use.  This replaces Nasacort for now.   Continue with saline irrigations as needed.

## 2019-07-12 NOTE — Patient Instructions (Addendum)
Severe persistent asthma  Read about Dupixent injections - we are going to try to switch to this and do self injections at home instead of Fasenra.  If this is not covered then will try to get Fasenra injections every 7 weeks instead of 8 weeks.  Daily controller medication(s): continue Dulera 200 2 puffs twice a day with spacer and rinse mouth afterwards. ? Continue Incruse 1 puff daily. ? Continue Singulair 10mg  daily.  Prior to physical activity:May use albuterol rescue inhaler 2 puffs 5 to 15 minutes prior to strenuous physical activities.  Rescue medications:May use albuterol rescue inhaler 2 puffs or nebulizer every 4 to 6 hours as needed for shortness of breath, chest tightness, coughing, and wheezing. Monitor frequency of use.   During upper respiratory infections: Start Flovent 110 2 puffs twice a day with spacer and rinse mouth afterwards for 1-2 weeks.  Asthma control goals:  Full participation in all desired activities (may need albuterol before activity) Albuterol use two times or less a week on average (not counting use with activity) Cough interfering with sleep two times or less a month Oral steroids no more than once a year No hospitalizations  Disorder of respiratory system exacerbated by aspirin  Continue to avoid NSAIDs.  History of nasal polyp  Start Xhance 2 sprays twice a day. Sample given. Demonstrated proper use.  This replaces Nasacort for now.   Continue with saline irrigations as needed.   Allergic rhinitis   No recent allergy testing. Consider retesting in future.  May use over the counter antihistamines such as Zyrtec (cetirizine), Claritin (loratadine), Allegra (fexofenadine), or Xyzal (levocetirizine) daily as needed.  Adverse food reaction  Continue avoid peanuts and oregano.   For mild symptoms you can take over the counter antihistamines such as Benadryl and monitor symptoms closely. If symptoms worsen or if you have severe symptoms  including breathing issues, throat closure, significant swelling, wholeb ody hives, severe diarrhea and vomiting, lightheadedness then inject epinephrine and seek immediate medical care afterwards.  Consider testing in future.   Penicillin allergy  Schedule for penicillin skin testing and challenge.  You must be off antihistamines for 3 days before. Okay to take Singulair.  You must be healthy and not sick.   I will send in a prescription for the medication a few days before the testing.  Follow up in 2 months or sooner if needed for the penicillin testing.

## 2019-07-12 NOTE — Assessment & Plan Note (Signed)
Past history - Throat tightness after eating peanuts. Tolerates tree nuts.  Continue avoid peanuts and oregano.   For mild symptoms you can take over the counter antihistamines such as Benadryl and monitor symptoms closely. If symptoms worsen or if you have severe symptoms including breathing issues, throat closure, significant swelling, whole body hives, severe diarrhea and vomiting, lightheadedness then inject epinephrine and seek immediate medical care afterwards.  Consider testing in future.

## 2019-07-12 NOTE — Assessment & Plan Note (Signed)
Patient has multiple antibiotic allergies. Most recently the bactrim caused rash. Currently on clindamycin for a tooth infection with no issues. History of hives with amoxicillin about 5 years ago. Resolved with benadryl.   Schedule for penicillin skin testing and challenge.

## 2019-07-12 NOTE — Assessment & Plan Note (Signed)
Past history - No recent allergy testing.  Interim history - some nasal congestion.   Consider retesting in future.  See assessment and plan as above.

## 2019-07-16 ENCOUNTER — Telehealth: Payer: Self-pay | Admitting: *Deleted

## 2019-07-16 NOTE — Telephone Encounter (Signed)
Called patient and advised approval, copay card and submission for Cotton with instructions on delivery to contact office for appt to start therapy

## 2019-07-16 NOTE — Telephone Encounter (Signed)
-----   Message from Garnet Sierras, DO sent at 07/12/2019  1:32 PM EDT ----- Lynelle Smoke, I would like to switch this patient from [place] to [place] every 2 week self injection at home. He has asthma and polyps. Thank you.

## 2019-08-16 ENCOUNTER — Ambulatory Visit (INDEPENDENT_AMBULATORY_CARE_PROVIDER_SITE_OTHER): Payer: Managed Care, Other (non HMO) | Admitting: *Deleted

## 2019-08-16 ENCOUNTER — Encounter: Payer: Self-pay | Admitting: Family

## 2019-08-16 ENCOUNTER — Other Ambulatory Visit: Payer: Self-pay

## 2019-08-16 DIAGNOSIS — J4550 Severe persistent asthma, uncomplicated: Secondary | ICD-10-CM

## 2019-08-16 MED ORDER — DUPILUMAB 300 MG/2ML ~~LOC~~ SOSY
600.0000 mg | PREFILLED_SYRINGE | Freq: Once | SUBCUTANEOUS | Status: AC
  Administered 2019-08-16: 12:00:00 600 mg via SUBCUTANEOUS

## 2019-08-16 NOTE — Progress Notes (Signed)
Immunotherapy   Patient Details  Name: Michael [name] MRN: [id number] Date of Birth: 1950-09-03  08/16/2019  Michael [name] started injections for  Dupixent 300 mg/68mL- 2 injections given today (total of 600 mg) demonstrated 1 injection in his left arm and [name] self administered the second injection in his left lower abdomen. Instructions on proper use given and demonstrated so he will be able to give himself the medication at home. He will continue to give 1 injection of 300 mg/66mL every 2 weeks.    Frequency: 300 mg every 2 weeks Epi-Pen:Epi-Pen Available  Consent signed and patient instructions given.  Lot # [id number] Expires 11/2021 [place] [id number]  Robie Ridge 08/16/2019, 11:24 AM

## 2019-08-19 ENCOUNTER — Other Ambulatory Visit: Payer: Self-pay

## 2019-08-19 MED ORDER — LEVOTHYROXINE SODIUM 75 MCG PO TABS: ORAL_TABLET | ORAL | 0 refills | Status: DC

## 2019-09-06 ENCOUNTER — Ambulatory Visit: Payer: Self-pay

## 2019-09-10 ENCOUNTER — Ambulatory Visit (HOSPITAL_BASED_OUTPATIENT_CLINIC_OR_DEPARTMENT_OTHER)
Admission: RE | Admit: 2019-09-10 | Discharge: 2019-09-10 | Disposition: A | Discharge status: Home / Self Care | Payer: Managed Care, Other (non HMO) | Source: Ambulatory Visit | Attending: Family | Admitting: Family

## 2019-09-10 ENCOUNTER — Encounter: Payer: Self-pay | Admitting: Family

## 2019-09-10 ENCOUNTER — Ambulatory Visit (INDEPENDENT_AMBULATORY_CARE_PROVIDER_SITE_OTHER): Payer: Managed Care, Other (non HMO) | Admitting: Family

## 2019-09-10 ENCOUNTER — Other Ambulatory Visit: Payer: Self-pay

## 2019-09-10 VITALS — BP 132/87 | HR 59 | Temp 97.4°F | Resp 17 | Ht 66.0 in | Wt 222.0 lb

## 2019-09-10 DIAGNOSIS — J4550 Severe persistent asthma, uncomplicated: Secondary | ICD-10-CM

## 2019-09-10 DIAGNOSIS — E039 Hypothyroidism, unspecified: Secondary | ICD-10-CM | POA: Diagnosis not present

## 2019-09-10 DIAGNOSIS — I10 Essential (primary) hypertension: Secondary | ICD-10-CM

## 2019-09-10 DIAGNOSIS — M546 Pain in thoracic spine: Secondary | ICD-10-CM

## 2019-09-10 DIAGNOSIS — M1A9XX0 Chronic gout, unspecified, without tophus (tophi): Secondary | ICD-10-CM

## 2019-09-10 DIAGNOSIS — G8929 Other chronic pain: Secondary | ICD-10-CM | POA: Diagnosis present

## 2019-09-10 DIAGNOSIS — E782 Mixed hyperlipidemia: Secondary | ICD-10-CM | POA: Diagnosis not present

## 2019-09-10 LAB — BASIC METABOLIC PANEL
BUN: 23 mg/dL (ref 6–23)
CO2: 29 mEq/L (ref 19–32)
Calcium: 9.2 mg/dL (ref 8.4–10.5)
Chloride: 104 mEq/L (ref 96–112)
Creatinine, Ser: 1.12 mg/dL (ref 0.40–1.50)
GFR: 65.33 mL/min (ref >60.00)
Glucose, Bld: 95 mg/dL (ref 70–99)
Potassium: 4.4 mEq/L (ref 3.5–5.1)
Sodium: 138 mEq/L (ref 135–145)

## 2019-09-10 LAB — LIPID PANEL
Cholesterol: 185 mg/dL (ref 0–200)
HDL: 34 mg/dL — ABNORMAL LOW (ref >39.00)
LDL Cholesterol: 118 mg/dL — ABNORMAL HIGH (ref 0–99)
NonHDL: 151.05
Total CHOL/HDL Ratio: 5
Triglycerides: 166 mg/dL — ABNORMAL HIGH (ref 0.0–149.0)
VLDL: 33.2 mg/dL (ref 0.0–40.0)

## 2019-09-10 NOTE — Patient Instructions (Signed)
Please complete lab work prior to leaving. Complete x-ray on the first floor.  

## 2019-09-10 NOTE — Progress Notes (Signed)
Subjective:    Patient ID: Michael [name], male    DOB: 1951-01-12, 69 y.o.   MRN: [id number]  HPI  Patient presents today for follow up:  Gout- maintained on allopurinol. Denies recent gout flare. Watching his diet at home.   HTN- maintained on losartan 50mg .  BP Readings from Last 3 Encounters:  09/10/19 132/87  07/12/19 120/76  04/12/19 126/90   Back pain-consistent pain between shoulder blades.  Has been intermittent x 6 months. Not worsened by movements.  Pain varies in intensity, can get "almost unbearable."  This typically occurs with prolonged standing and improves with sitting. Never "completely goes away."  Has not tried any otc meds other than tylenol.  Denies abdominal pain, nausea.   Hypothyroid- maintained on synthroid 75 mcg.  Lab Results  Component Value Date   TSH 2.66 05/25/2018   Asthma- following with allergy clinic.  Reports that his medications were recently changed.  Currently dupixent.  Previously on fasenra.    Review of Systems See HPI  Past Medical History:  Diagnosis Date  . Allergy   . Arthritis   . Asthma   . Cancer (HCC)    basal cell and squamous cell carcinoma  . Cancer [facility])    prostate  . Clotting disorder ([place])   . Colon polyps   . GERD (gastroesophageal reflux disease)   . Gout   . High cholesterol   . History of hepatitis    due to drug interaction?  . History of pancreatitis 2016   ?due to colchicine  . History of pulmonary embolus (PE) 2016  . History of stomach ulcers   . Hypertension   . Hypothyroidism   . OSA (obstructive sleep apnea) 2015   has CPAP  . Pneumonia   . Sleep apnea   . Thyroid disease    hypothyroid  . Urinary incontinence      Social History   Socioeconomic History  . Marital status: Married    Spouse name: Not on file  . Number of children: Not on file  . Years of education: Not on file  . Highest education level: Not on file  Occupational History  . Not on file  Tobacco Use  .  Smoking status: Never Smoker  . Smokeless tobacco: Never Used  Substance and Sexual Activity  . Alcohol use: Yes    Alcohol/week: 1.0 standard drinks    Types: 1 Glasses of wine per week  . Drug use: No  . Sexual activity: Not on file  Other Topics Concern  . Not on file  Social History Narrative   Former Lawyer at [employer], now Optician, dispensing.    Married   3 daughters- all live locally   Has 7 grandchildren and 1 great grandchild   Enjoys reading, Geophysicist/field seismologist   Social Determinants of Radio broadcast assistant Strain:   . Difficulty of Paying Living Expenses:   Food Insecurity:   . Worried About Charity fundraiser in the Last Year:   . Arboriculturist in the Last Year:   Transportation Needs:   . Film/video editor (Medical):   Marland Kitchen Lack of Transportation (Non-Medical):   Physical Activity:   . Days of Exercise per Week:   . Minutes of Exercise per Session:   Stress:   . Feeling of Stress :   Social Connections:   . Frequency of Communication with Friends and Family:   . Frequency of Social Gatherings with Friends  and Family:   . Attends Religious Services:   . Active Member of Clubs or Organizations:   . Attends Archivist Meetings:   Marland Kitchen Marital Status:   Intimate Partner Violence:   . Fear of Current or Ex-Partner:   . Emotionally Abused:   Marland Kitchen Physically Abused:   . Sexually Abused:     Past Surgical History:  Procedure Laterality Date  . BASAL CELL CARCINOMA EXCISION  2017   MOHS.  beneath right eye  . CHOLECYSTECTOMY  2001  . COLONOSCOPY     multiple - last 06/2017  . HIP SURGERY    . PENILE PROSTHESIS IMPLANT  2016  . PROSTATECTOMY  2009  . SINUS SURGERY WITH INSTATRAK  2014  . SQUAMOUS CELL CARCINOMA EXCISION  01/2017   nose  . TOOTH EXTRACTION    . TOTAL HIP ARTHROPLASTY  06/2016    Family History  Problem Relation Age of Onset  . Asthma Mother   . Arthritis Mother   . Skin cancer Mother        basal cell  carcinoma  . Asthma Father   . Arthritis Father   . Diabetes Father   . Heart attack Father   . Hyperlipidemia Father   . Hypertension Father   . Kidney disease Father   . Diabetes Brother   . Breast cancer Maternal Grandmother   . Diabetes Paternal Grandfather   . Arthritis Brother   . Hyperlipidemia Brother   . Alcohol abuse Brother   . Colon cancer Neg Hx   . Esophageal cancer Neg Hx   . Liver cancer Neg Hx   . Pancreatic cancer Neg Hx   . Rectal cancer Neg Hx   . Stomach cancer Neg Hx     Allergies  Allergen Reactions  . Colchicine Other (See Comments)    Pancreatitis  . Doxycycline Hives  . Statins Other (See Comments)    Joint stiffness  . Avelox [Moxifloxacin Hcl In Nacl] Other (See Comments)    SEIZURE  . Bactrim [Sulfamethoxazole-Trimethoprim] Hives  . Cefdinir Hives  . Oregano [Origanum Oil] Cough  . Peanut-Containing Drug Products Swelling    Throat swelling  . Adhesive  [Tape] Other (See Comments)    "tears skin"  . Penicillins Rash    Current Outpatient Medications on File Prior to Visit  Medication Sig Dispense Refill  . allopurinol (ZYLOPRIM) 100 MG tablet TAKE 1 TABLET BY MOUTH DAILY X 1 WEEK THEN 2 TABLETS BY MOUTH DAILY 180 tablet 0  . calcium carbonate (OS-CAL - DOSED IN MG OF ELEMENTAL CALCIUM) 1250 (500 Ca) MG tablet Take 1 tablet by mouth daily.    . Cholecalciferol (VITAMIN D3) 5000 units CAPS Take by mouth.    . docusate sodium (COLACE) 100 MG capsule Take 200 mg by mouth daily.     Marland Kitchen EPINEPHrine 0.3 mg/0.3 mL IJ SOAJ injection Use as directed for severe allergic reactions 4 each 3  . FASENRA 30 MG/ML SOSY     . fexofenadine (ALLEGRA) 180 MG tablet Take 180 mg by mouth daily.     Marland Kitchen ipratropium-albuterol (DUONEB) 0.5-2.5 (3) MG/3ML SOLN 1 vial in nebulizer every 4-6 hours while awake for the next 3-5 days for coughing or wheezing. 360 mL 1  . levothyroxine (SYNTHROID) 75 MCG tablet TAKE 1 TABLET(75 MCG) BY MOUTH DAILY BEFORE BREAKFAST 30  tablet 0  . losartan (COZAAR) 50 MG tablet TAKE 1 TABLET(50 MG) BY MOUTH DAILY 90 tablet 1  . Magnesium 250 MG  TABS Take by mouth.    . mometasone-formoterol (DULERA) 200-5 MCG/ACT AERO Inhale 2 puffs into the lungs 2 (two) times daily. 13 g 5  . montelukast (SINGULAIR) 10 MG tablet TAKE 1 TABLET(10 MG) BY MOUTH AT BEDTIME 30 tablet 5  . umeclidinium bromide (INCRUSE ELLIPTA) 62.5 MCG/INH AEPB Inhale 1 puff into the lungs daily. 30 each 5  . VENTOLIN HFA 108 (90 Base) MCG/ACT inhaler INHALE 2 PUFFS INTO THE LUNGS EVERY 6 HOURS AS NEEDED FOR WHEEZING OR SHORTNESS OF BREATH 18 g 0  . DUPIXENT 300 MG/2ML prefilled syringe      Current Facility-Administered Medications on File Prior to Visit  Medication Dose Route Frequency Provider Last Rate Last Admin  . 0.9 %  sodium chloride infusion  500 mL Intravenous Once Gatha Mayer, MD      . Benralizumab SOSY 30 mg  30 mg Subcutaneous Q8 Weeks Bobbitt, [place] Muta, MD   30 mg at 07/12/19 1546    BP 132/87 (BP Location: Right Arm, Patient Position: Sitting, Cuff Size: Large)   Pulse (!) 59   Temp (!) 97.4 F (36.3 C) (Temporal)   Resp 17   Ht 5\' 6"  (1.676 m)   Wt 222 lb (100.7 kg)   SpO2 96%   BMI 35.83 kg/m       Objective:   Physical Exam Constitutional:      General: He is not in acute distress.    Appearance: He is well-developed.  HENT:     Head: Normocephalic and atraumatic.  Cardiovascular:     Rate and Rhythm: Normal rate and regular rhythm.     Heart sounds: No murmur.  Pulmonary:     Effort: Pulmonary effort is normal. No respiratory distress.     Breath sounds: Normal breath sounds. No wheezing or rales.  Abdominal:     Tenderness: There is no right CVA tenderness or left CVA tenderness.  Musculoskeletal:     Thoracic back: No tenderness.     Lumbar back: No tenderness.  Skin:    General: Skin is warm and dry.  Neurological:     Mental Status: He is alert and oriented to person, place, and time.  Psychiatric:          Behavior: Behavior normal.        Thought Content: Thought content normal.           Assessment & Plan:  Thoracic back pain- obtain x-ray. Offered PT referral. Pt declines at this time.  HTN- bp stable. Continue losartan. Check follow up bmet.  Asthma- fair control- management per allergist.  Gout- stable on allopurinal/gout diet. Continue same.  Hyperlipidemia- check follow up lipid panel.  Hypothyroid- clinically stable on synthroid. Obtain follow up tsh.  This visit occurred during the SARS-CoV-2 public health emergency.  Safety protocols were in place, including screening questions prior to the visit, additional usage of staff PPE, and extensive cleaning of exam room while observing appropriate contact time as indicated for disinfecting solutions.

## 2019-09-11 ENCOUNTER — Other Ambulatory Visit: Payer: Self-pay

## 2019-09-11 DIAGNOSIS — M109 Gout, unspecified: Secondary | ICD-10-CM

## 2019-09-11 LAB — TSH: TSH: 3.6 u[IU]/mL (ref 0.35–4.50)

## 2019-09-11 MED ORDER — LEVOTHYROXINE SODIUM 75 MCG PO TABS: ORAL_TABLET | ORAL | 1 refills | Status: DC

## 2019-09-11 MED ORDER — ALLOPURINOL 100 MG PO TABS: 100.0000 mg | ORAL_TABLET | Freq: Two times a day (BID) | ORAL | 0 refills | Status: DC

## 2019-09-11 MED ORDER — LOSARTAN POTASSIUM 50 MG PO TABS: ORAL_TABLET | ORAL | 1 refills | Status: DC

## 2019-09-25 ENCOUNTER — Ambulatory Visit: Payer: Managed Care, Other (non HMO) | Attending: Family | Admitting: Physical Therapy

## 2019-09-25 ENCOUNTER — Encounter: Payer: Self-pay | Admitting: Physical Therapy

## 2019-09-25 ENCOUNTER — Other Ambulatory Visit: Payer: Self-pay

## 2019-09-25 DIAGNOSIS — M62830 Muscle spasm of back: Secondary | ICD-10-CM | POA: Diagnosis present

## 2019-09-25 DIAGNOSIS — R293 Abnormal posture: Secondary | ICD-10-CM | POA: Diagnosis present

## 2019-09-25 DIAGNOSIS — M546 Pain in thoracic spine: Secondary | ICD-10-CM | POA: Diagnosis present

## 2019-09-25 NOTE — Therapy (Addendum)
[facility] [address]  [place], [place], [address] Phone: [phone]   Fax:  [phone]  Physical Therapy Evaluation  Patient Details  Name: Michael [name] MRN: [id number] Date of Birth: Mar 11, 1951 Referring Provider (PT): Debbrah Alar, NP   Encounter Date: 09/25/2019  PT End of Session - 09/25/19 0839    Visit Number  1    Number of Visits  5    Date for PT Re-Evaluation  11/20/19    Authorization Type  [employer]    PT Start Time  0802    PT Stop Time  0833    PT Time Calculation (min)  31 min    Activity Tolerance  Patient tolerated treatment well    Behavior During Therapy  [facility] for tasks assessed/performed       Past Medical History:  Diagnosis Date  . Allergy   . Arthritis   . Asthma   . Cancer (HCC)    basal cell and squamous cell carcinoma  . Cancer [facility])    prostate  . Clotting disorder ([place])   . Colon polyps   . GERD (gastroesophageal reflux disease)   . Gout   . High cholesterol   . History of hepatitis    due to drug interaction?  . History of pancreatitis 2016   ?due to colchicine  . History of pulmonary embolus (PE) 2016  . History of stomach ulcers   . Hypertension   . Hypothyroidism   . OSA (obstructive sleep apnea) 2015   has CPAP  . Pneumonia   . Sleep apnea   . Thyroid disease    hypothyroid  . Urinary incontinence     Past Surgical History:  Procedure Laterality Date  . BASAL CELL CARCINOMA EXCISION  2017   MOHS.  beneath right eye  . CHOLECYSTECTOMY  2001  . COLONOSCOPY     multiple - last 06/2017  . HIP SURGERY    . PENILE PROSTHESIS IMPLANT  2016  . PROSTATECTOMY  2009  . SINUS SURGERY WITH INSTATRAK  2014  . SQUAMOUS CELL CARCINOMA EXCISION  01/2017   nose  . TOOTH EXTRACTION    . TOTAL HIP ARTHROPLASTY  06/2016    There were no vitals filed for this visit.   Subjective Assessment - 09/25/19 0803    Subjective  Patient reports mid back pain  lasting for the past year. Unsure what causes it, but notes worsening over time. Pain is located just below the L shoulder blade and worse with prolonged standing such as when cooking or when laying on L side. No changes with hunger/eating. Intermittently has N/T down L arm and into hand. Denies changes in B&B control. Better with walking. Notes that he has a torn biceps tendon.    Pertinent History  hypothyroidism, HTN, hx PE 2016, HLD, gout, GERD, hx prostate & skin CA, asthma, THA 2018    Limitations  Lifting;Standing;House hold activities;Walking    How long can you sit comfortably?  unlimited    How long can you stand comfortably?  4-5 hours    How long can you walk comfortably?  unlimited    Diagnostic tests  09/10/19 thoracic xray: Mild levo convexity and spinal degenerative change. No sign of acute fracture or malalignment.    Patient Stated Goals  "minimize the pain"    Currently in Pain?  Yes    Pain Score  5     Pain Location  Back  Pain Orientation  Mid;Left    Pain Descriptors / Indicators  Aching    Pain Type  Chronic pain         [facility] PT Assessment - 09/25/19 0810      Assessment   Medical Diagnosis  Chronic midline thoracic pain    Referring Provider (PT)  Debbrah Alar, NP    Onset Date/Surgical Date  09/25/18    Hand Dominance  Right    Next MD Visit  12/11/19    Prior Therapy  yes- s/p THA      Precautions   Precautions  None      Balance Screen   Has the patient fallen in the past 6 months  No    Has the patient had a decrease in activity level because of a fear of falling?   No    Is the patient reluctant to leave their home because of a fear of falling?   No      Home Environment   Living Environment  Private residence    Living Arrangements  Spouse/significant other    Available Help at Discharge  Family;Friend(s)    Type of Home  House      Prior Function   Level of Independence  Independent    Vocation  Full time employment    Vocation  Requirements  computer work    Leisure  photography, cooking      Cognition   Overall Cognitive Status  Within Functional Limits for tasks assessed      Sensation   Light Touch  Appears Intact      Coordination   Gross Motor Movements are Fluid and Coordinated  Yes      Posture/Postural Control   Posture/Postural Control  Postural limitations    Postural Limitations  Rounded Shoulders;Forward head      ROM / Strength   AROM / PROM / Strength  AROM;Strength      AROM   AROM Assessment Site  Shoulder;Thoracic    Right/Left Shoulder  --   B shoulder AROM [facility]   Thoracic Flexion  mildly limited   pain over L scapula   Thoracic Extension  mildly limited    Thoracic - Right Side Bend  [facility]    Thoracic - Left Side Bend  [facility]    Thoracic - Right Rotation  [facility]    Thoracic - Left Rotation  mildly limited      Strength   Strength Assessment Site  Shoulder    Right/Left Shoulder  Right;Left    Right Shoulder Flexion  4+/5    Right Shoulder ABduction  4+/5    Right Shoulder Internal Rotation  4+/5    Right Shoulder External Rotation  4+/5    Left Shoulder Flexion  4/5    Left Shoulder ABduction  4/5    Left Shoulder Internal Rotation  4-/5   pain over scapula   Left Shoulder External Rotation  4+/5      Palpation   Spinal mobility  slight L concave thoracic scoliosis     Palpation comment  increased tone and very TTP over L rhomboids and thoracic paraspinals                   Objective measurements completed on examination: See above findings.              PT Education - 09/25/19 0839    Education Details  prognosis, POC, HEP, edu/demonstration on use of tennis ball for  L rhomboids massage    Person(s) Educated  Patient    Methods  Explanation;Demonstration;Tactile cues;Verbal cues;Handout    Comprehension  Verbalized understanding;Returned demonstration       PT Short Term Goals - 09/25/19 0843      PT SHORT TERM GOAL #1   Title  Patient to be  independent with initial HEP.    Time  4    Period  Weeks    Status  New    Target Date  10/23/19        PT Long Term Goals - 09/25/19 0844      PT LONG TERM GOAL #1   Title  Patient to be independent with advanced HEP.    Time  8    Period  Weeks    Status  New    Target Date  11/20/19      PT LONG TERM GOAL #2   Title  Patient to demonstrate thoracic AROM WFL and without pain limiting.    Time  4    Period  Weeks    Status  New    Target Date  11/20/19      PT LONG TERM GOAL #3   Title  Patient to demonsntrate L shoulder strength >/=4+/5.    Time  8    Period  Weeks    Status  New    Target Date  11/20/19      PT LONG TERM GOAL #4   Title  Patient to report 75% improvement in tolerance for 4 hours of standing while cooking.    Time  8    Period  Weeks    Status  New    Target Date  11/20/19      PT LONG TERM GOAL #5   Title  Patient to report no tenderness with palpation over L periscapular musculature.    Time  8    Period  Weeks    Status  New    Target Date  11/20/19             Plan - 09/25/19 0839    Clinical Impression Statement  Patient is a 69y/o M presenting to OPPT with c/o chronic insidious L sided scapular pain of 1 year duration. Pain is aggravated by prolonged standing such as when cooking or when laying in L sidelying. Denies changes in B&B control or changes in pain with hunger/eating. Does note intermittent N/T in down the L arm and into hand. Patient today presenting with rounded shoulders and forward head posture, limited thoracic AROM, decreased L shoulder strength, and increased tone and tenderness over L rhomboids and thoracic paraspinals. Patient educated on gentle postural correction HEP- patient reported understanding. Would benefit from skilled PT services 1x every other week for 8 weeks to address aforementioned impairments.    Personal Factors and Comorbidities  Age;Sex;Comorbidity 3+;Fitness;Past/Current Experience;Profession;Time  since onset of injury/illness/exacerbation    Comorbidities  hypothyroidism, HTN, hx PE 2016, HLD, gout, GERD, hx prostate & skin CA, asthma, THA 2018    Examination-Activity Limitations  Sleep;Stand;Carry;Reach Overhead    Examination-Participation Restrictions  Church;Cleaning;Shop;Community Activity;Driving;Yard Work;Laundry;Meal Prep    Stability/Clinical Decision Making  Stable/Uncomplicated    Clinical Decision Making  Low    Rehab Potential  Good    PT Frequency  Biweekly    PT Duration  8 weeks    PT Treatment/Interventions  ADLs/Self Care Home Management;Cryotherapy;Electrical Stimulation;Iontophoresis 72m/ml Dexamethasone;Moist Heat;Balance training;Therapeutic exercise;Therapeutic activities;Functional mobility training;Stair training;Gait training;Ultrasound;Neuromuscular re-education;Patient/family education;Manual techniques;Vasopneumatic Device;Taping;Energy conservation;Dry  needling;Passive range of motion    PT Next Visit Plan  thoracic FOTO; reassess HEP; periscapular strengthening and postural correction ther-ex    Consulted and Agree with Plan of Care  Patient       Patient will benefit from skilled therapeutic intervention in order to improve the following deficits and impairments:  Decreased activity tolerance, Decreased strength, Increased fascial restricitons, Impaired UE functional use, Pain, Increased muscle spasms, Improper body mechanics, Decreased range of motion, Impaired flexibility, Postural dysfunction  Visit Diagnosis: Muscle spasm of back  Pain in thoracic spine  Abnormal posture     Problem List Patient Active Problem List   Diagnosis Date Noted  . Disorder of respiratory system exacerbated by aspirin 04/12/2019  . Adverse food reaction 04/12/2019  . Left knee pain 08/22/2017  . Hypothyroid 03/24/2017  . Hyperlipidemia 03/24/2017  . Gout 03/24/2017  . Squamous cell carcinoma 03/24/2017  . History of pulmonary embolism 03/24/2017  . Prostate  cancer ([place]) 03/24/2017  . OSA (obstructive sleep apnea) 03/24/2017  . History of colon polyps 03/24/2017  . Hypertension 03/24/2017  . Severe persistent asthma 02/09/2017  . Allergic rhinitis 02/09/2017  . Chronic sinusitis 02/09/2017  . History of nasal polyp 02/09/2017  . Allergy to multiple antibiotics 02/09/2017     Janene Harvey, PT, DPT 09/25/19 12:23 PM   [facility] [address]  [place], [place], [address] Phone: [phone]   Fax:  [phone]  Name: Michael [name] MRN: [id number] Date of Birth: 03/29/1951  PHYSICAL THERAPY DISCHARGE SUMMARY  Visits from Start of Care: 1  Current functional level related to goals / functional outcomes: See above clinical impression; patient did not return d/t insurance issues   Remaining deficits: See above   Education / Equipment: HEP  Plan: Patient agrees to discharge.  Patient goals were not met. Patient is being discharged due to not returning since the last visit.  ?????     Janene Harvey, PT, DPT 12/10/19 1:32 PM

## 2019-10-09 ENCOUNTER — Ambulatory Visit: Payer: Managed Care, Other (non HMO) | Admitting: Physical Therapy

## 2019-10-23 ENCOUNTER — Encounter: Payer: Managed Care, Other (non HMO) | Admitting: Physical Therapy

## 2019-10-30 ENCOUNTER — Encounter: Payer: Self-pay | Admitting: Allergy

## 2019-10-30 ENCOUNTER — Other Ambulatory Visit: Payer: Self-pay | Admitting: *Deleted

## 2019-10-30 MED ORDER — DULERA 200-5 MCG/ACT IN AERO: 2.0000 | INHALATION_SPRAY | Freq: Two times a day (BID) | RESPIRATORY_TRACT | 3 refills | Status: DC

## 2019-11-05 ENCOUNTER — Other Ambulatory Visit: Payer: Self-pay

## 2019-11-05 MED ORDER — MONTELUKAST SODIUM 10 MG PO TABS: ORAL_TABLET | ORAL | 0 refills | Status: DC

## 2019-11-06 ENCOUNTER — Encounter: Payer: Managed Care, Other (non HMO) | Admitting: Physical Therapy

## 2019-11-18 ENCOUNTER — Other Ambulatory Visit: Payer: Self-pay | Admitting: Allergy

## 2019-11-19 ENCOUNTER — Encounter: Payer: Self-pay | Admitting: Allergy

## 2019-11-19 ENCOUNTER — Telehealth: Payer: Self-pay

## 2019-11-19 NOTE — Progress Notes (Addendum)
[place] [address] Dept: [phone]  FOLLOW UP NOTE  Patient ID: Michael [name], male    DOB: 1951-02-18  Age: 69 y.o. MRN: [id number] Date of Office Visit: 11/20/2019  Assessment  Chief Complaint: Asthma (coughing and wheezing x 2 weeks)  HPI Michael [name] is a 69 year old male who presents to the clinic for evaluation of acute asthma exacerbation.  He was last seen in this clinic on 07/12/2019 by Dr. Maudie Mercury for evaluation of severe persistent asthma, disorder of respiratory system exacerbated by aspirin, history of nasal polyp, drug reaction, and adverse food reaction.  At today's visit he reports that his asthma has been poorly controlled for the last 2-1/2 weeks and has significantly worsened over the last 1-1/2 weeks with symptoms including shortness of breath all day and worse at night, wheeze occurring all day and worse at night, dry cough which is worse in the evening, and chest tightness occurring with breathing.  He denies fever, body aches, sweats, chills, and sick contacts.  He continues montelukast 10 mg once a day, Dulera 200-2 puffs twice a day with a spacer, Incruse 1 puff once a day, and DuoNeb every day for the last week or 2.  He is currently using Dupixent 300 mg once every 2 weeks for the last 3 or 4 months and reports that he has not had good control of his asthma during that time.  Prior to using Dupixent he was on Fasenra.  He reports Berna Bue gave him better asthma control for the first 6 to 7 weeks and then diminished during the last 1 to 2 weeks before the next injection.  Allergic rhinitis is reported as moderately well controlled with occasional nasal congestion mostly occurring on the left side for which he uses a nasal saline rinse and Flonase daily.  He reports that he has tried [facility] for control of nasal congestion and nasal polyps, however, his insurance would not cover this medication.  He reports that he has not noticed a change  in nasal congestion or nasal polyps well using Dupixent.  He continues to avoid peanuts, tree nuts, and oregano with no accidental ingestion or EpiPen use since his last visit to this clinic.  He denies heartburn and is not currently taking PPI or H2 blocker.  He does report a history of heartburn for which he took Nexium with resolution of symptoms.  His current medications are listed in the chart.   Drug Allergies:  Allergies  Allergen Reactions  . Colchicine Other (See Comments)    Pancreatitis  . Doxycycline Hives  . Statins Other (See Comments)    Joint stiffness  . Avelox [Moxifloxacin Hcl In Nacl] Other (See Comments)    SEIZURE  . Bactrim [Sulfamethoxazole-Trimethoprim] Hives  . Cefdinir Hives  . Oregano [Origanum Oil] Cough  . Peanut-Containing Drug Products Swelling    Throat swelling  . Adhesive  [Tape] Other (See Comments)    "tears skin"  . Penicillins Rash    Physical Exam: BP (!) 138/90   Pulse 61   Temp 97.8 F (36.6 C) (Oral)   Resp 16   SpO2 97%    Physical Exam Vitals reviewed.  Constitutional:      Appearance: Normal appearance.  HENT:     Head: Normocephalic and atraumatic.     Right Ear: Tympanic membrane normal.     Left Ear: Tympanic membrane normal.     Nose:     Comments: Bilateral nares slightly erythematous with  clear nasal drainage.  Pharynx normal.  Ears normal.  Eyes normal.    Mouth/Throat:     Pharynx: Oropharynx is clear.  Eyes:     Conjunctiva/sclera: Conjunctivae normal.  Cardiovascular:     Rate and Rhythm: Normal rate and regular rhythm.     Heart sounds: Normal heart sounds. No murmur heard.   Pulmonary:     Effort: Pulmonary effort is normal.     Breath sounds: Normal breath sounds.     Comments: Lungs clear to auscultation Musculoskeletal:        General: Normal range of motion.     Cervical back: Normal range of motion and neck supple.  Skin:    General: Skin is warm.  Neurological:     Mental Status: He is alert and  oriented to person, place, and time.  Psychiatric:        Mood and Affect: Mood normal.        Behavior: Behavior normal.        Thought Content: Thought content normal.        Judgment: Judgment normal.    Diagnostics: FVC 3.21, FEV1 2.20.  Predicted FVC 4.03, predicted FEV1 2.98.  Spirometry indicates normal ventilatory function.  Post bronchodilator spirometry FVC 3.41, FEV1 2.32.  Postbronchodilator spirometry indicates mild airway obstruction with no significant bronchodilator response.  Assessment and Plan: 1. Severe persistent asthma with acute exacerbation   2. History of nasal polyp   3. Adverse food reaction, subsequent encounter   4. Allergic rhinitis, unspecified seasonality, unspecified trigger   5. Aspirin sensitivity     Meds ordered this encounter  Medications  . fluticasone (FLOVENT HFA) 110 MCG/ACT inhaler    Sig: Inhale 2 puffs into the lungs in the morning and at bedtime. Use with spacer. Rinse, gargle and spit out after use.    Dispense:  1 Inhaler    Refill:  5    Patient Instructions  Asthma Begin prednisone 10 mg tablets. Take 2 tablets twice a day for 3 days, then take 2 tablets once a day for 1 day, then take 1 tablet on the 5th day, then stop Begin Flovent 110-2 puffs twice a day with a spacer for 1-2 weeks until you are cough and wheeze free Continue Dulera 200-2 puffs twice a day with a spacer to prevent cough or wheeze Continue Incruse Ellipta 1 puff once a day to prevent cough or wheeze Continue montelukast 10 mg once a day to prevent cough or wheeze Continue albuterol 2 puffs every 4 hours as needed for cough or wheeze.  You may use albuterol 2 puffs 5 to 15 minutes before activity to decrease cough or wheeze Continue Dupixent 300 mg once every 2 weeks and have access to an epinephrine autoinjector set for now. We can switch back to [place] if you are interested. Let's evaluate this at your next visit.  If no improvement with the treatment plan as  listed above in the next few days, we will move forward with a chest xray If your symptoms worsen or if you develop a fever, call the clinic or 911 for emergency assistance.   Reflux Begin omeprazole 20 mg once a day to prevent reflux. This may help with cough or wheeze. We will evaluate this at your next visit.  Continue lifestyle and dietary modifications as listed below  Allergic rhinitis Continue Flonase 2 sprays in each nostril once a day as needed for nasal congestion.  In the right nostril, point the applicator  out toward the right ear. In the left nostril, point the applicator out toward the left ear Continue Xyzal 5 mg once a day as needed for runny nose. Remember to rotate to a different antihistamine about every 3 months. Some examples of over the counter antihistamines include Zyrtec (cetirizine), Xyzal (levocetirizine), Allegra (fexofenadine), and Claritin (loratidine).  Consider saline nasal rinses as needed for nasal symptoms. Use this before any medicated nasal sprays for best result  Nasal polyp Continue Flonase as listed above  Food allergy Continue to avoid peanut, tree nuts, and oregano. In case of an allergic reaction, take Benadryl 50 mg every 4 hours, and if life-threatening symptoms occur, inject with EpiPen 0.3 mg.  Your blood pressure was elevated at today's visit. Follow up with your primary care provider for further evaluation of your blood pressure. Call the clinic if this treatment plan is not working well for you  Follow up in 1 month or sooner if needed.   Return in about 4 weeks (around 12/18/2019), or if symptoms worsen or fail to improve.    Thank you for the opportunity to care for this patient.  Please do not hesitate to contact me with questions.  Gareth Morgan, FNP [facility] [place]  ________________________________________________  I have provided oversight concerning [facility]'s evaluation and treatment of this patient's  health issues addressed during today's encounter.  I agree with the assessment and therapeutic plan as outlined in the note.   Signed,   R Edgar Frisk, MD

## 2019-11-19 NOTE — Telephone Encounter (Signed)
Michael [name], Michael [name] "[address]  [name], DO 5 hours ago (11:21 AM)   I'm having to use my nebulizer every other day for the last two weeks in the evening due to coughing and asthma. I stayed indoors during the [place] conditions last week as well. What is your suggestion to address my asthma control issues? I have lengthy coughing bouts in the evening. I've had to take Benadryl at night in addition to the nebulizer treatments.  Spoke with pt and was able to get him scheduled for tomorrow 11/20/2019 with anne ambs fnp for a follow up on the asthma

## 2019-11-19 NOTE — Patient Instructions (Addendum)
Asthma Begin prednisone 10 mg tablets. Take 2 tablets twice a day for 3 days, then take 2 tablets once a day for 1 day, then take 1 tablet on the 5th day, then stop Begin Flovent 110-2 puffs twice a day with a spacer for 1-2 weeks until you are cough and wheeze free Continue Dulera 200-2 puffs twice a day with a spacer to prevent cough or wheeze Continue Incruse Ellipta 1 puff once a day to prevent cough or wheeze Continue montelukast 10 mg once a day to prevent cough or wheeze Continue albuterol 2 puffs every 4 hours as needed for cough or wheeze.  You may use albuterol 2 puffs 5 to 15 minutes before activity to decrease cough or wheeze Continue Dupixent 300 mg once every 2 weeks and have access to an epinephrine autoinjector set for now. We can switch back to [place] if you are interested. Let's evaluate this at your next visit.  If no improvement with the treatment plan as listed above in the next few days, we will move forward with a chest xray If your symptoms worsen or if you develop a fever, call the clinic or 911 for emergency assistance.   Reflux Begin omeprazole 20 mg once a day to prevent reflux. This may help with cough or wheeze. We will evaluate this at your next visit.  Continue lifestyle and dietary modifications as listed below  Allergic rhinitis Continue Flonase 2 sprays in each nostril once a day as needed for nasal congestion.  In the right nostril, point the applicator out toward the right ear. In the left nostril, point the applicator out toward the left ear Continue Xyzal 5 mg once a day as needed for runny nose. Remember to rotate to a different antihistamine about every 3 months. Some examples of over the counter antihistamines include Zyrtec (cetirizine), Xyzal (levocetirizine), Allegra (fexofenadine), and Claritin (loratidine).  Consider saline nasal rinses as needed for nasal symptoms. Use this before any medicated nasal sprays for best result  Nasal polyp Continue  Flonase as listed above  Food allergy Continue to avoid peanut, tree nuts, and oregano. In case of an allergic reaction, take Benadryl 50 mg every 4 hours, and if life-threatening symptoms occur, inject with EpiPen 0.3 mg.  Your blood pressure was elevated at today's visit. Follow up with your primary care provider for further evaluation of your blood pressure. Call the clinic if this treatment plan is not working well for you  Follow up in 1 month or sooner if needed.

## 2019-11-20 ENCOUNTER — Encounter: Payer: Self-pay | Admitting: Family Medicine

## 2019-11-20 ENCOUNTER — Encounter: Payer: Managed Care, Other (non HMO) | Admitting: Physical Therapy

## 2019-11-20 ENCOUNTER — Other Ambulatory Visit: Payer: Self-pay

## 2019-11-20 ENCOUNTER — Ambulatory Visit (INDEPENDENT_AMBULATORY_CARE_PROVIDER_SITE_OTHER): Payer: Managed Care, Other (non HMO) | Admitting: Family Medicine

## 2019-11-20 VITALS — BP 138/90 | HR 61 | Temp 97.8°F | Resp 16

## 2019-11-20 DIAGNOSIS — J4551 Severe persistent asthma with (acute) exacerbation: Secondary | ICD-10-CM

## 2019-11-20 DIAGNOSIS — Z886 Allergy status to analgesic agent status: Secondary | ICD-10-CM

## 2019-11-20 DIAGNOSIS — J309 Allergic rhinitis, unspecified: Secondary | ICD-10-CM

## 2019-11-20 DIAGNOSIS — T781XXD Other adverse food reactions, not elsewhere classified, subsequent encounter: Secondary | ICD-10-CM

## 2019-11-20 DIAGNOSIS — Z8709 Personal history of other diseases of the respiratory system: Secondary | ICD-10-CM | POA: Diagnosis not present

## 2019-11-20 DIAGNOSIS — Z888 Allergy status to other drugs, medicaments and biological substances status: Secondary | ICD-10-CM

## 2019-11-20 MED ORDER — FLOVENT HFA 110 MCG/ACT IN AERO
2.0000 | INHALATION_SPRAY | Freq: Two times a day (BID) | RESPIRATORY_TRACT | 5 refills | Status: DC
Start: 2019-11-20 — End: 2020-01-03

## 2019-11-25 ENCOUNTER — Encounter: Payer: Self-pay | Admitting: Family Medicine

## 2019-11-25 ENCOUNTER — Telehealth: Payer: Self-pay

## 2019-11-25 NOTE — Telephone Encounter (Signed)
[name], Michael Hobin "[address]  [name], FNP 4 minutes ago (4:28 PM)   I wanted to let you know that I was worse on Friday and Saturday with coughing and irritation in the upper airways. I had to use the nebulizer both evenings. I finished the steroids on schedule but started an antibiotic that I had on hand Saturday afternoon. The antibiotic seems to be helping but the cough persists. Mucinex helped Sunday evening. I checked several times and no fever was noted.  Let me know if there's anything else I should be doing.

## 2019-11-25 NOTE — Telephone Encounter (Signed)
Please call patient back.  What antibiotics did he take and how many times? I do not have any records of recent prescription for antibiotics.  Is his breathing improving? Coughing up any phlegm/spuputm? Color? Fevers? Sick contacts? Recent covid-19 testing?   Plan:  For the next 7 days, use the albuterol nebulizer in the morning and at night before using all his other inhalers to open up his lungs.  May use albuterol every 4-6 hours additionally if needed.   Continue Flovent 110-2 puffs twice a day with a spacer for 1-2 weeks until you are cough and wheeze free  Continue Dulera 200-2 puffs twice a day with a spacer to prevent cough or wheeze  Continue Incruse Ellipta 1 puff once a day to prevent cough or wheeze  Continue montelukast 10 mg once a day to prevent cough or wheeze  Continue Dupixent 300 mg once every 2 weeks.

## 2019-11-26 NOTE — Telephone Encounter (Signed)
Left patient a message to call the office back. 

## 2019-11-29 ENCOUNTER — Other Ambulatory Visit: Payer: Self-pay

## 2019-11-29 ENCOUNTER — Other Ambulatory Visit: Payer: Self-pay | Admitting: Allergy

## 2019-11-29 MED ORDER — ALBUTEROL SULFATE (2.5 MG/3ML) 0.083% IN NEBU
2.5000 mg | INHALATION_SOLUTION | Freq: Four times a day (QID) | RESPIRATORY_TRACT | 1 refills | Status: DC | PRN
Start: 2019-11-29 — End: 2020-01-03

## 2019-11-29 NOTE — Telephone Encounter (Signed)
Spoke with pt he is on clindamycin tid start it Saturday 11-23-2019 with 7 days worth this is the first antibiotic he has been on since march. His breathing could be better but he has coughing up a lot of stuff. His phlegm is yellow in color. No fever and no sick contacts he is aware of. No he has not been covid tested. He does not have albuterol neb solution just duoneb so sent in albuterol neb solution for him to use. He said he felt like better control when on fasenra but knows the dupixent helps with the nasal polys. He wants to discuss the switch with you in person at next appt.

## 2019-11-29 NOTE — Telephone Encounter (Signed)
Noted.  I don't have him scheduled for a follow up.  Please call patient to schedule that follow up. If he is not feeling better next week then we should see him as well.  Thank you.   I will be only in the [facility] office a handful more days and then I'll be not going there anymore.   He is more than welcome to follow with me in [place] or [place] he can follow up with Dr. Verlin Fester as his case is a little complicated for the nurse practitioners. He saw Dr. Verlin Fester in the past.

## 2019-12-03 NOTE — Telephone Encounter (Signed)
Pt is doing better since doing the albuterol bid

## 2019-12-05 ENCOUNTER — Other Ambulatory Visit: Payer: Self-pay | Admitting: Allergy

## 2019-12-06 ENCOUNTER — Telehealth: Payer: Self-pay

## 2019-12-06 NOTE — Telephone Encounter (Signed)
Just a note to let you know that my asthma is improving and using the albuterol morning and evening with the nebulizer really helped. Hopefully my control is improving and the Dupixent injection today will help keep the asthma under better control next week. Thanks for your guidance.

## 2019-12-06 NOTE — Telephone Encounter (Signed)
Oh that's great

## 2019-12-10 ENCOUNTER — Encounter: Payer: Self-pay | Admitting: Family

## 2019-12-10 ENCOUNTER — Other Ambulatory Visit: Payer: Self-pay | Admitting: Allergy

## 2019-12-10 DIAGNOSIS — M109 Gout, unspecified: Secondary | ICD-10-CM

## 2019-12-10 MED ORDER — ALLOPURINOL 100 MG PO TABS: 100.0000 mg | ORAL_TABLET | Freq: Two times a day (BID) | ORAL | 3 refills | Status: DC

## 2019-12-11 ENCOUNTER — Encounter: Payer: Managed Care, Other (non HMO) | Admitting: Family

## 2019-12-18 ENCOUNTER — Encounter: Payer: Managed Care, Other (non HMO) | Admitting: Physical Therapy

## 2019-12-18 ENCOUNTER — Other Ambulatory Visit: Payer: Self-pay

## 2019-12-18 ENCOUNTER — Ambulatory Visit (INDEPENDENT_AMBULATORY_CARE_PROVIDER_SITE_OTHER): Payer: Managed Care, Other (non HMO) | Admitting: Family

## 2019-12-18 ENCOUNTER — Encounter: Payer: Self-pay | Admitting: Family

## 2019-12-18 VITALS — BP 126/76 | HR 61 | Resp 18 | Ht 66.0 in | Wt 232.0 lb

## 2019-12-18 DIAGNOSIS — I10 Essential (primary) hypertension: Secondary | ICD-10-CM

## 2019-12-18 DIAGNOSIS — E039 Hypothyroidism, unspecified: Secondary | ICD-10-CM | POA: Diagnosis not present

## 2019-12-18 DIAGNOSIS — Z23 Encounter for immunization: Secondary | ICD-10-CM

## 2019-12-18 DIAGNOSIS — Z0000 Encounter for general adult medical examination without abnormal findings: Secondary | ICD-10-CM

## 2019-12-18 DIAGNOSIS — M25512 Pain in left shoulder: Secondary | ICD-10-CM | POA: Diagnosis not present

## 2019-12-18 LAB — BASIC METABOLIC PANEL
BUN: 17 mg/dL (ref 7–25)
CO2: 26 mmol/L (ref 20–32)
Calcium: 9.2 mg/dL (ref 8.6–10.3)
Chloride: 106 mmol/L (ref 98–110)
Creat: 1.11 mg/dL (ref 0.70–1.25)
Glucose, Bld: 100 mg/dL — ABNORMAL HIGH (ref 65–99)
Potassium: 4.4 mmol/L (ref 3.5–5.3)
Sodium: 139 mmol/L (ref 135–146)

## 2019-12-18 LAB — CBC WITH DIFFERENTIAL/PLATELET
Absolute Monocytes: 687 cells/uL (ref 200–950)
Basophils Absolute: 82 cells/uL (ref 0–200)
Basophils Relative: 1.2 %
Eosinophils Absolute: 231 cells/uL (ref 15–500)
Eosinophils Relative: 3.4 %
HCT: 46.7 % (ref 38.5–50.0)
Hemoglobin: 16.1 g/dL (ref 13.2–17.1)
Lymphs Abs: 1537 cells/uL (ref 850–3900)
MCH: 32.5 pg (ref 27.0–33.0)
MCHC: 34.5 g/dL (ref 32.0–36.0)
MCV: 94.2 fL (ref 80.0–100.0)
MPV: 9.8 fL (ref 7.5–12.5)
Monocytes Relative: 10.1 %
Neutro Abs: 4264 cells/uL (ref 1500–7800)
Neutrophils Relative %: 62.7 %
Platelets: 263 10*3/uL (ref 140–400)
RBC: 4.96 10*6/uL (ref 4.20–5.80)
RDW: 12.9 % (ref 11.0–15.0)
Total Lymphocyte: 22.6 %
WBC: 6.8 10*3/uL (ref 3.8–10.8)

## 2019-12-18 LAB — TSH: TSH: 3.6 mIU/L (ref 0.40–4.50)

## 2019-12-18 LAB — HEPATIC FUNCTION PANEL
AG Ratio: 2.1 (calc) (ref 1.0–2.5)
ALT: 26 U/L (ref 9–46)
AST: 15 U/L (ref 10–35)
Albumin: 4.2 g/dL (ref 3.6–5.1)
Alkaline phosphatase (APISO): 49 U/L (ref 35–144)
Bilirubin, Direct: 0.1 mg/dL (ref 0.0–0.2)
Globulin: 2 g/dL (calc) (ref 1.9–3.7)
Indirect Bilirubin: 0.4 mg/dL (calc) (ref 0.2–1.2)
Total Bilirubin: 0.5 mg/dL (ref 0.2–1.2)
Total Protein: 6.2 g/dL (ref 6.1–8.1)

## 2019-12-18 LAB — LIPID PANEL
Cholesterol: 191 mg/dL (ref <200)
HDL: 44 mg/dL (ref >=40)
LDL Cholesterol (Calc): 122 mg/dL (calc) — ABNORMAL HIGH
Non-HDL Cholesterol (Calc): 147 mg/dL (calc) — ABNORMAL HIGH (ref <130)
Total CHOL/HDL Ratio: 4.3 (calc) (ref <5.0)
Triglycerides: 141 mg/dL (ref <150)

## 2019-12-18 MED ORDER — CLOTRIMAZOLE-BETAMETHASONE 1-0.05 % EX CREA: 1.0000 "application " | TOPICAL_CREAM | Freq: Two times a day (BID) | CUTANEOUS | 0 refills | Status: DC

## 2019-12-18 MED ORDER — LOSARTAN POTASSIUM 50 MG PO TABS: 25.0000 mg | ORAL_TABLET | Freq: Every day | ORAL | 1 refills | Status: DC

## 2019-12-18 NOTE — Addendum Note (Signed)
Addended by: Jiles Prows on: 12/18/2019 12:31 PM   Modules accepted: Orders

## 2019-12-18 NOTE — Progress Notes (Signed)
Subjective:    Patient ID: Michael [name], male    DOB: 1950/10/15, 69 y.o.   MRN: [id number]  HPI  Patient presents today for complete physical.  Immunizations: pfizer completed.  Has had prevnar 13.  Needs pneumovax booster, shingrix and tdap up to date. Needs high dose flu shot Diet:  Has had a lot of stress Wt Readings from Last 3 Encounters:  12/18/19 232 lb (105.2 kg)  09/10/19 222 lb (100.7 kg)  07/12/19 230 lb (104.3 kg)  Exercise: walking regular Colonoscopy: 07/10/17 PSA:  Hx of prostate CA,s/p prostatectomy- following with urology- Dr. Rosana Hoes. They are monitoring PSA's.  Vision:  Up to date Dental: up to date  Review of Systems  Constitutional: Positive for unexpected weight change (stress related).  HENT: Negative for hearing loss and rhinorrhea.   Eyes: Negative for visual disturbance.  Respiratory: Negative for cough.        Notes some recent asthma symptoms- took a 3 day prednisone course  Cardiovascular: Negative for chest pain.  Gastrointestinal: Negative for constipation and diarrhea.  Genitourinary: Negative for difficulty urinating.  Musculoskeletal: Positive for arthralgias (some left shoulder pain) and neck pain (attributes to working on computer).  Skin: Negative for rash (reports rash on left foot).       Past Medical History:  Diagnosis Date   Allergy    Arthritis    Asthma    Cancer ([place])    basal cell and squamous cell carcinoma   Cancer (HCC)    prostate   Clotting disorder (HCC)    Colon polyps    GERD (gastroesophageal reflux disease)    Gout    High cholesterol    History of hepatitis    due to drug interaction?   History of pancreatitis 2016   ?due to colchicine   History of pulmonary embolus (PE) 2016   History of stomach ulcers    Hypertension    Hypothyroidism    OSA (obstructive sleep apnea) 2015   has CPAP   Pneumonia    Sleep apnea    Thyroid disease    hypothyroid   Urinary incontinence        Social History   Socioeconomic History   Marital status: Married    Spouse name: Not on file   Number of children: Not on file   Years of education: Not on file   Highest education level: Not on file  Occupational History   Not on file  Tobacco Use   Smoking status: Never Smoker   Smokeless tobacco: Never Used  Vaping Use   Vaping Use: Never used  Substance and Sexual Activity   Alcohol use: Yes    Alcohol/week: 1.0 standard drink    Types: 1 Glasses of wine per week   Drug use: No   Sexual activity: Not on file  Other Topics Concern   Not on file  Social History Narrative   Former Lawyer at [employer], now Optician, dispensing.    Married   3 daughters- all live locally   Has 7 grandchildren and 1 great grandchild   Enjoys reading, Geophysicist/field seismologist   Social Determinants of Radio broadcast assistant Strain:    Difficulty of Paying Living Expenses: Not on file  Food Insecurity:    Worried About Charity fundraiser in the Last Year: Not on file   [employer] of Food in the Last Year: Not on file  Transportation Needs:    Lack of Transportation (  Medical): Not on file   Lack of Transportation (Non-Medical): Not on file  Physical Activity:    Days of Exercise per Week: Not on file   Minutes of Exercise per Session: Not on file  Stress:    Feeling of Stress : Not on file  Social Connections:    Frequency of Communication with Friends and Family: Not on file   Frequency of Social Gatherings with Friends and Family: Not on file   Attends Religious Services: Not on file   Active Member of Clubs or Organizations: Not on file   Attends Archivist Meetings: Not on file   Marital Status: Not on file  Intimate Partner Violence:    Fear of Current or Ex-Partner: Not on file   Emotionally Abused: Not on file   Physically Abused: Not on file   Sexually Abused: Not on file    Past Surgical History:  Procedure  Laterality Date   BASAL CELL CARCINOMA EXCISION  2017   MOHS.  beneath right eye   CHOLECYSTECTOMY  2001   COLONOSCOPY     multiple - last 06/2017   HIP SURGERY     PENILE PROSTHESIS IMPLANT  2016   PROSTATECTOMY  2009   SINUS SURGERY WITH INSTATRAK  2014   SQUAMOUS CELL CARCINOMA EXCISION  01/2017   nose   TOOTH EXTRACTION     TOTAL HIP ARTHROPLASTY  06/2016    Family History  Problem Relation Age of Onset   Asthma Mother    Arthritis Mother    Skin cancer Mother        basal cell carcinoma   Asthma Father    Arthritis Father    Diabetes Father    Heart attack Father    Hyperlipidemia Father    Hypertension Father    Kidney disease Father    Diabetes Brother    Breast cancer Maternal Grandmother    Diabetes Paternal Grandfather    Arthritis Brother    Hyperlipidemia Brother    Alcohol abuse Brother    Colon cancer Neg Hx    Esophageal cancer Neg Hx    Liver cancer Neg Hx    Pancreatic cancer Neg Hx    Rectal cancer Neg Hx    Stomach cancer Neg Hx     Allergies  Allergen Reactions   Colchicine Other (See Comments)    Pancreatitis   Doxycycline Hives   Statins Other (See Comments)    Joint stiffness   Avelox [Moxifloxacin Hcl In Nacl] Other (See Comments)    SEIZURE   Bactrim [Sulfamethoxazole-Trimethoprim] Hives   Cefdinir Hives   Oregano [Origanum Oil] Cough   Peanut-Containing Drug Products Swelling    Throat swelling   Adhesive  [Tape] Other (See Comments)    "tears skin"   Penicillins Rash    Current Outpatient Medications on File Prior to Visit  Medication Sig Dispense Refill   albuterol (PROVENTIL) (2.5 MG/3ML) 0.083% nebulizer solution Take 3 mLs (2.5 mg total) by nebulization every 6 (six) hours as needed for wheezing or shortness of breath. 75 mL 1   albuterol (VENTOLIN HFA) 108 (90 Base) MCG/ACT inhaler INHALE 2 PUFFS INTO THE LUNGS EVERY 6 HOURS AS NEEDED FOR WHEEZING OR SHORTNESS OF BREATH 8.5 g 0    allopurinol (ZYLOPRIM) 100 MG tablet Take 1 tablet (100 mg total) by mouth 2 (two) times daily. 180 tablet 3   calcium carbonate (OS-CAL - DOSED IN MG OF ELEMENTAL CALCIUM) 1250 (500 Ca) MG tablet Take 1  tablet by mouth daily.     Cholecalciferol (VITAMIN D3) 5000 units CAPS Take by mouth.     docusate sodium (COLACE) 100 MG capsule Take 200 mg by mouth daily.      DUPIXENT 300 MG/2ML prefilled syringe      EPINEPHrine 0.3 mg/0.3 mL IJ SOAJ injection Use as directed for severe allergic reactions 4 each 3   fexofenadine (ALLEGRA) 180 MG tablet Take 180 mg by mouth daily.      fluticasone (FLOVENT HFA) 110 MCG/ACT inhaler Inhale 2 puffs into the lungs in the morning and at bedtime. Use with spacer. Rinse, gargle and spit out after use. 1 Inhaler 5   INCRUSE ELLIPTA 62.5 MCG/INH AEPB INHALE 1 PUFF INTO THE LUNGS DAILY 30 each 5   ipratropium-albuterol (DUONEB) 0.5-2.5 (3) MG/3ML SOLN 1 vial in nebulizer every 4-6 hours while awake for the next 3-5 days for coughing or wheezing. 360 mL 1   levothyroxine (SYNTHROID) 75 MCG tablet TAKE 1 TABLET(75 MCG) BY MOUTH DAILY BEFORE BREAKFAST 90 tablet 1   losartan (COZAAR) 50 MG tablet TAKE 1 TABLET(50 MG) BY MOUTH DAILY 90 tablet 1   Magnesium 250 MG TABS Take by mouth.     mometasone-formoterol (DULERA) 200-5 MCG/ACT AERO Inhale 2 puffs into the lungs 2 (two) times daily. 13 g 3   montelukast (SINGULAIR) 10 MG tablet TAKE 1 TABLET(10 MG) BY MOUTH AT BEDTIME 30 tablet 1   Current Facility-Administered Medications on File Prior to Visit  Medication Dose Route Frequency Provider Last Rate Last Admin   0.9 %  sodium chloride infusion  500 mL Intravenous Once Gatha Mayer, MD       Benralizumab SOSY 30 mg  30 mg Subcutaneous Q8 Weeks Bobbitt, [place] Muta, MD   30 mg at 07/12/19 1546    BP 126/76 (BP Location: Left Arm, Patient Position: Sitting, Cuff Size: Normal)    Pulse 61    Resp 18    Ht 5\' 6"  (1.676 m)    Wt 232 lb (105.2 kg)     SpO2 97%    BMI 37.45 kg/m    Objective:   Physical Exam  Physical Exam  Constitutional: He is oriented to person, place, and time. He appears well-developed and well-nourished. No distress.  HENT:  Head: Normocephalic and atraumatic.  Right Ear: Tympanic membrane and ear canal normal.  Left Ear: Tympanic membrane and ear canal normal.  Mouth/Throat: not examined- pt wearing mask Eyes: Pupils are equal, round, and reactive to light. No scleral icterus.  Neck: Normal range of motion. No thyromegaly present.  Cardiovascular: Normal rate and regular rhythm.   No murmur heard. Pulmonary/Chest: Effort normal and breath sounds normal. No respiratory distress. He has no wheezes. He has no rales. He exhibits no tenderness.  Abdominal: Soft. Bowel sounds are normal. He exhibits no distension and no mass. There is no tenderness. There is no rebound and no guarding.  Musculoskeletal: He exhibits no edema.  Lymphadenopathy:    He has no cervical adenopathy.  Neurological: He is alert and oriented to person, place, and time. He has normal patellar reflexes. He exhibits normal muscle tone. Coordination normal.  Skin: Skin is warm and dry.  Psychiatric: He has a normal mood and affect. His behavior is normal. Judgment and thought content normal.           Assessment & Plan:   Preventative- Encouraged healthy diet, exercise, weight loss.  Unfortunately,  He recently lost his grand-daughter in a fatal  car accident. She was 7 months pregnant and had a 25 yr old daughter.  He and his family continue to grieve this loss. Support provided.    Colo up to date.  Pneumovax 23 booster today.  PSA per urology.  HTN- reports that he is only taking 1/2 tab of losartan. BP looks good today. Advised pt OK to continue reduced dose.  L shoulder pain- uncontrolled. Will refer to orthopedics for further evaluation.  Hypothyroid- clinically stable on synthroid. Obtain follow up A1C.    This visit occurred  during the SARS-CoV-2 public health emergency.  Safety protocols were in place, including screening questions prior to the visit, additional usage of staff PPE, and extensive cleaning of exam room while observing appropriate contact time as indicated for disinfecting solutions.         Assessment & Plan:

## 2019-12-18 NOTE — Patient Instructions (Addendum)
Apply lotrisone to top of foot twice daily as needed.

## 2019-12-24 ENCOUNTER — Other Ambulatory Visit: Payer: Self-pay

## 2019-12-24 ENCOUNTER — Encounter: Payer: Self-pay | Admitting: Family Medicine

## 2019-12-24 ENCOUNTER — Ambulatory Visit (INDEPENDENT_AMBULATORY_CARE_PROVIDER_SITE_OTHER): Payer: Managed Care, Other (non HMO)

## 2019-12-24 ENCOUNTER — Ambulatory Visit (INDEPENDENT_AMBULATORY_CARE_PROVIDER_SITE_OTHER): Payer: Managed Care, Other (non HMO) | Admitting: Family Medicine

## 2019-12-24 DIAGNOSIS — M25512 Pain in left shoulder: Secondary | ICD-10-CM | POA: Diagnosis not present

## 2019-12-24 DIAGNOSIS — G8929 Other chronic pain: Secondary | ICD-10-CM

## 2019-12-24 MED ORDER — BACLOFEN 10 MG PO TABS: 5.0000 mg | ORAL_TABLET | Freq: Three times a day (TID) | ORAL | 3 refills | Status: DC | PRN

## 2019-12-24 NOTE — Progress Notes (Signed)
Office Visit Note   Patient: Michael [name]           Date of Birth: 05-Sep-1950           MRN: [id number] Visit Date: 12/24/2019 Requested by: Debbrah Alar, NP [address] [place],  [place] [address] PCP: Debbrah Alar, NP  Subjective: Chief Complaint  Patient presents with  . Left Shoulder - Pain    Sayskept him up last night, has torn biceps tendon, pain in shoulder blade, into the joint & down the back of the arm half way, worse in the shoulder blade area. Had a fall 3 yrs ago, pain onset 7-8 months, worsened recently. Takes tylenol, no NSAIDs    HPI: He is here with left shoulder blade pain.  Symptoms started several months ago, no definite injury.  He started feeling pain in the shoulder blade area with occasional radiation into the axilla and sometimes into the upper arm.  He went to physical therapy where they had him do some strengthening exercises which made his pain worse.  He bought a cervical traction device to use at home and that helps, but only while he is in it.  The pain is constant, he cannot find a comfortable position.  He had x-rays of his thoracic spine in May which showed some degenerative changes.  He feels pain at the base of his neck.  He is right-hand dominant.  He has not noticed any recent weakness in his arm or numbness.  He has a little bit of weakness in his left biceps due to a rupture of the long head biceps tendon a few years ago.  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              ROS:   No fevers or chills, no night sweats.  All other systems were reviewed and are negative.  Objective: Vital Signs: There were no vitals taken for this visit.  Physical Exam:  General:  Alert and oriented, in no acute distress. Pulm:  Breathing unlabored. Psy:  Normal mood, congruent affect. Skin: No rash Neck: He has symmetrically diminished range of motion with rotation and side bending.  Spurling's  test is equivocal.  He has some tenderness to the left of the C6-7 area.  He has tender trigger points in the rhomboid on the left that seem to reproduce his pain.  5/5 deltoid, rotator cuff, biceps, triceps, wrist and intrinsic hand strength.  2+ DTRs.  Imaging: XR Cervical Spine 2 or 3 views  Result Date: 12/24/2019 X-rays of cervical spine reveal moderate degenerative disc disease at C5-6 and moderate to severe at C6-7 with spurring and facet arthropathy at those levels.  No sign of compression fracture or neoplasm.   Assessment & Plan: 1.  Chronic neck and left shoulder pain, possibly myofascial pain but cannot rule out cervical radiculopathy. -We will try physical therapy for cervical traction and myofascial release techniques.  If he fails to improve, he will contact me and I will order MRI scan cervical spine followed by epidural injection if indicated. -Trial of baclofen as needed.     Procedures: No procedures performed  No notes on file     PMFS History: Patient Active Problem List   Diagnosis Date Noted  . Disorder of respiratory system exacerbated by aspirin 04/12/2019  . Adverse food reaction 04/12/2019  . Left knee pain 08/22/2017  . Hypothyroid 03/24/2017  .  Hyperlipidemia 03/24/2017  . Gout 03/24/2017  . Squamous cell carcinoma 03/24/2017  . History of pulmonary embolism 03/24/2017  . Prostate cancer ([place]) 03/24/2017  . OSA (obstructive sleep apnea) 03/24/2017  . History of colon polyps 03/24/2017  . Hypertension 03/24/2017  . Severe persistent asthma 02/09/2017  . Allergic rhinitis 02/09/2017  . Chronic sinusitis 02/09/2017  . History of nasal polyp 02/09/2017  . Allergy to multiple antibiotics 02/09/2017   Past Medical History:  Diagnosis Date  . Allergy   . Arthritis   . Asthma   . Cancer (HCC)    basal cell and squamous cell carcinoma  . Cancer [facility])    prostate  . Clotting disorder ([place])   . Colon polyps   . GERD (gastroesophageal reflux  disease)   . Gout   . High cholesterol   . History of hepatitis    due to drug interaction?  . History of pancreatitis 2016   ?due to colchicine  . History of pulmonary embolus (PE) 2016  . History of stomach ulcers   . Hypertension   . Hypothyroidism   . OSA (obstructive sleep apnea) 2015   has CPAP  . Pneumonia   . Sleep apnea   . Thyroid disease    hypothyroid  . Urinary incontinence     Family History  Problem Relation Age of Onset  . Asthma Mother   . Arthritis Mother   . Skin cancer Mother        basal cell carcinoma  . Asthma Father   . Arthritis Father   . Diabetes Father   . Heart attack Father   . Hyperlipidemia Father   . Hypertension Father   . Kidney disease Father   . Diabetes Brother   . Breast cancer Maternal Grandmother   . Diabetes Paternal Grandfather   . Arthritis Brother   . Hyperlipidemia Brother   . Alcohol abuse Brother   . Colon cancer Neg Hx   . Esophageal cancer Neg Hx   . Liver cancer Neg Hx   . Pancreatic cancer Neg Hx   . Rectal cancer Neg Hx   . Stomach cancer Neg Hx     Past Surgical History:  Procedure Laterality Date  . BASAL CELL CARCINOMA EXCISION  2017   MOHS.  beneath right eye  . CHOLECYSTECTOMY  2001  . COLONOSCOPY     multiple - last 06/2017  . HIP SURGERY    . PENILE PROSTHESIS IMPLANT  2016  . PROSTATECTOMY  2009  . SINUS SURGERY WITH INSTATRAK  2014  . SQUAMOUS CELL CARCINOMA EXCISION  01/2017   nose  . TOOTH EXTRACTION    . TOTAL HIP ARTHROPLASTY  06/2016   Social History   Occupational History  . Not on file  Tobacco Use  . Smoking status: Never Smoker  . Smokeless tobacco: Never Used  Vaping Use  . Vaping Use: Never used  Substance and Sexual Activity  . Alcohol use: Yes    Alcohol/week: 1.0 standard drink    Types: 1 Glasses of wine per week  . Drug use: No  . Sexual activity: Not on file

## 2019-12-27 ENCOUNTER — Encounter: Payer: Self-pay | Admitting: Family Medicine

## 2020-01-01 ENCOUNTER — Ambulatory Visit: Payer: Managed Care, Other (non HMO) | Attending: Family Medicine | Admitting: Physical Therapy

## 2020-01-01 ENCOUNTER — Other Ambulatory Visit: Payer: Self-pay

## 2020-01-01 DIAGNOSIS — R293 Abnormal posture: Secondary | ICD-10-CM | POA: Insufficient documentation

## 2020-01-01 DIAGNOSIS — M542 Cervicalgia: Secondary | ICD-10-CM | POA: Diagnosis present

## 2020-01-01 DIAGNOSIS — M546 Pain in thoracic spine: Secondary | ICD-10-CM | POA: Diagnosis present

## 2020-01-01 DIAGNOSIS — M5413 Radiculopathy, cervicothoracic region: Secondary | ICD-10-CM | POA: Insufficient documentation

## 2020-01-01 DIAGNOSIS — M62830 Muscle spasm of back: Secondary | ICD-10-CM

## 2020-01-01 NOTE — Therapy (Signed)
[facility] [place] [address]  [place], [place], [address] Phone: [phone]   Fax:  [phone]  Physical Therapy Evaluation  Patient Details  Name: Michael [name] MRN: [id number] Date of Birth: [birth year] Referring Provider (PT): Eunice Blase, MD   Encounter Date: 01/01/2020   PT End of Session - 01/01/20 0801    Visit Number 1    Number of Visits 12    Date for PT Re-Evaluation 02/12/20    Authorization Type [employer]    PT Start Time 0801    PT Stop Time 0852    PT Time Calculation (min) 51 min    Activity Tolerance Patient tolerated treatment well    Behavior During Therapy [facility] for tasks assessed/performed           Past Medical History:  Diagnosis Date  . Allergy   . Arthritis   . Asthma   . Cancer (HCC)    basal cell and squamous cell carcinoma  . Cancer [facility])    prostate  . Clotting disorder ([place])   . Colon polyps   . GERD (gastroesophageal reflux disease)   . Gout   . High cholesterol   . History of hepatitis    due to drug interaction?  . History of pancreatitis 2016   ?due to colchicine  . History of pulmonary embolus (PE) 2016  . History of stomach ulcers   . Hypertension   . Hypothyroidism   . OSA (obstructive sleep apnea) 2015   has CPAP  . Pneumonia   . Sleep apnea   . Thyroid disease    hypothyroid  . Urinary incontinence     Past Surgical History:  Procedure Laterality Date  . BASAL CELL CARCINOMA EXCISION  2017   MOHS.  beneath right eye  . CHOLECYSTECTOMY  2001  . COLONOSCOPY     multiple - last 06/2017  . HIP SURGERY    . PENILE PROSTHESIS IMPLANT  2016  . PROSTATECTOMY  2009  . SINUS SURGERY WITH INSTATRAK  2014  . SQUAMOUS CELL CARCINOMA EXCISION  01/2017   nose  . TOOTH EXTRACTION    . TOTAL HIP ARTHROPLASTY  06/2016    There were no vitals filed for this visit.    Subjective Assessment - 01/01/20 0804    Subjective Pt reports pain running from neck  down spine into L shoulder blade and L arm for past 9 months, dramatically worsening over past few weeks. Neck x-ray completed last week revealed DDD with potential bone spur compressing nerve per pt. MD wanting trial of PT and will proceed with MRI and injection if no benefit noted. Pain keeps him up at night. Denies issues with mobility or ROM.    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  No sign of compression fracture or neoplasm.  09/10/19 Thoracic xray: Mild levo convexity and spinal degenerative change. No sign of acute fracture or malalignment.    Patient Stated Goals "to be able to sleep at night"    Currently in Pain? Yes    Pain Score 5    4-5/10; up to 7-8/10 at worst   Pain Location Scapula  Pain Orientation Left    Pain Descriptors / Indicators Cramping;Spasm    Pain Type Chronic pain    Pain Radiating Towards into L upper arm; denies N/T    Pain Onset More than a month ago    Pain Frequency Constant    Aggravating Factors  sleeping, sitting at computer while working, looking down (such as when reading a book)    Pain Relieving Factors Tylenol, Lidocaine patch    Effect of Pain on Daily Activities has rearranged desk so monitors are higher up; pain interferes with sleeping, work and reading for leisure              [facility] PT Assessment - 01/01/20 0801      Assessment   Medical Diagnosis Chronic L scapular/shoulder pain    Referring Provider (PT) Eunice Blase, MD    Onset Date/Surgical Date --   ~9 months   Hand Dominance Right    Next MD Visit TBD pending progress with PT    Prior Therapy 1 PT visit - exercises made pain worse       Precautions   Precautions None      Balance Screen   Has the patient fallen in the past 6 months No   serious fall ~3 yrs ago landing on L arm   Has the patient had a decrease in activity level because of a fear of falling?  No    Is the patient reluctant to leave their home because of a fear of falling?  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[employer] reading, cooking, walking dogs several times a day      Cognition   Overall Cognitive Status Within Functional Limits for tasks assessed      Observation/Other Assessments   Focus on Therapeutic Outcomes (FOTO)  Neck - 57% (43% limitation); Predicted 62% (38% limitation)      Sensation   Light Touch Appears Intact      Coordination   Gross Motor Movements are Fluid and Coordinated Yes      Posture/Postural Control   Posture/Postural Control Postural limitations    Postural Limitations Forward head;Rounded Shoulders    Posture Comments L shoulder elevated      ROM / Strength   AROM / PROM / Strength AROM;Strength      AROM   Overall AROM Comments B shoulder ROM WFL but pain with L ER    AROM Assessment Site Cervical;Shoulder    Cervical Flexion 50   pain in neck & scapula   Cervical Extension 43    Cervical - Right Side Bend 36    Cervical - Left Side Bend 20   pain   Cervical - Right Rotation 50    Cervical - Left Rotation 47   pain     Strength   Strength Assessment Site Shoulder;Hand    Right/Left Shoulder Right;Left    Right Shoulder Flexion 5/5    Right Shoulder ABduction 5/5    Right Shoulder Internal Rotation 5/5    Right Shoulder External Rotation 5/5    Left Shoulder Flexion 4+/5    Left Shoulder ABduction 5/5  Left  Shoulder Internal Rotation 5/5    Left Shoulder External Rotation 5/5    Right/Left hand Right;Left    Right Hand Grip (lbs) 63.33   65, 64, 61   Left Hand Grip (lbs) 59.33   64, 59, 55     Palpation   Spinal mobility slight L concave thoracic scoliosis     Palpation comment increased muscle tension & very ttp over L LS, rhomboids and thoracic paraspinals as well as UT to a lesser degree                      Objective measurements completed on examination: See above findings.       [facility] Adult PT Treatment/Exercise - 01/01/20 0801      Exercises   Exercises Neck      Neck Exercises: Stretches   Upper Trapezius Stretch Left;30 seconds;1 rep    Levator Stretch Left;30 seconds;1 rep    Other Neck Stretches Rhomboid stretch x 30 sec                  PT Education - 01/01/20 0852    Education Details PT eval findings, anticipated POC and initial HEP    Person(s) Educated Patient    Methods Explanation;Demonstration;Verbal cues;Handout    Comprehension Verbalized understanding;Verbal cues required;Returned demonstration;Need further instruction            PT Short Term Goals - 01/01/20 6237      PT SHORT TERM GOAL #1   Title Patient to be independent with initial HEP.    Status New    Target Date 01/15/20             PT Long Term Goals - 01/01/20 6283      PT LONG TERM GOAL #1   Title Patient to be independent with ongoing/advanced HEP.    Status New    Target Date 02/12/20      PT LONG TERM GOAL #2   Title Patient to demonstrate cervical AROM WFL without pain provocation.    Status New    Target Date 02/12/20      PT LONG TERM GOAL #3   Title Patient to report no tenderness with palpation over L periscapular musculature.    Status New    Target Date 02/12/20      PT LONG TERM GOAL #4   Title Patient to report 75% reduction in pain while reading or standing while cooking.    Status New    Target Date 02/12/20      PT LONG TERM GOAL  #5   Title Patient will report no sleep disturbance due to L neck or periscapular pain.    Status New    Target Date 02/12/20                  Plan - 01/01/20 0852    Clinical Impression Statement Michael [name] is a 69 y/o male who presents to OP PT for chronic L scapular shoulder pain for >9 months with significant worsening over past few weeks. Pain presumed to be due to cervicothoracic radiculopathy and myofascial origin with recent x-rays revealing moderate degenerative disc disease at C5-6 and moderate to severe at C6-7 with spurring and facet arthropathy at those levels. Pain is aggravated by prolonged sitting or standing where forward activities occurring such as when cooking, working on computer or readding as well as when laying in L sidelying. Patient presents today with rounded shoulders and forward head  posture; limited cervical and thoracic AROM with pain noted with cervical ROM noted into flexion, L side bending and rotation; B shoulder ROM WNL but mildy decreased L shoulder and grip strength; as well as increased tone/muscle tension and severe ttp evident in L LS, medial periscapular muscles and L thoracic paraspinals with UT also tight but not quite as tender. Pain interferes with sleeping, work tasks and reading for leisure. Michael [name] will benefit from skilled PT to address above deficits, promote neutral spinal posture and restore functional cervical ROM with decreased pain and muscle tightness to restore improved quality of life with decreased pain interference.    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    Examination-Activity Limitations Sleep;Stand;Carry;Reach Overhead    Examination-Participation Restrictions Occupation;Meal Prep;Yard  Work;Community Activity;Laundry;Cleaning;Driving    Stability/Clinical Decision Making Evolving/Moderate complexity    Clinical Decision Making Moderate    Rehab Potential Good    PT Frequency 2x / week    PT Duration 6 weeks    PT Treatment/Interventions ADLs/Self Care Home Management;Cryotherapy;Electrical Stimulation;Iontophoresis 4mg /ml Dexamethasone;Moist Heat;Traction;Ultrasound;Functional mobility training;Therapeutic activities;Therapeutic exercise;Neuromuscular re-education;Patient/family education;Manual techniques;Passive range of motion;Dry needling;Taping;Spinal Manipulations;Joint Manipulations    PT Next Visit Plan manual therapy with probable DN for L periscapular muscles; postural stretching/strengthening; modalities including trial of estim or mechanical traction    Consulted and Agree with Plan of Care Patient           Patient will benefit from skilled therapeutic intervention in order to improve the following deficits and impairments:  Decreased activity tolerance, Decreased endurance, Decreased range of motion, Decreased mobility, Decreased strength, Increased fascial restricitons, Increased muscle spasms, Impaired flexibility, Improper body mechanics, Postural dysfunction, Impaired UE functional use, Pain  Visit Diagnosis: Radiculopathy, cervicothoracic region  Cervicalgia  Pain in thoracic spine  Muscle spasm of back  Abnormal posture     Problem List Patient Active Problem List   Diagnosis Date Noted  . Disorder of respiratory system exacerbated by aspirin 04/12/2019  . Adverse food reaction 04/12/2019  . Left knee pain 08/22/2017  . Hypothyroid 03/24/2017  . Hyperlipidemia 03/24/2017  . Gout 03/24/2017  . Squamous cell carcinoma 03/24/2017  . History of pulmonary embolism 03/24/2017  . Prostate cancer ([place]) 03/24/2017  . OSA (obstructive sleep apnea) 03/24/2017  . History of colon polyps 03/24/2017  . Hypertension 03/24/2017  . Severe persistent  asthma 02/09/2017  . Allergic rhinitis 02/09/2017  . Chronic sinusitis 02/09/2017  . History of nasal polyp 02/09/2017  . Allergy to multiple antibiotics 02/09/2017    Percival Spanish, PT, MPT 01/01/2020, 12:26 PM  [facility] [address]  [place], [place], [address] Phone: [phone]   Fax:  [phone]  Name: Michael [name] MRN: [id number] Date of Birth: December 25, 1950

## 2020-01-01 NOTE — Patient Instructions (Addendum)
.  Trigger Point Dry Needling  . What is Trigger Point Dry Needling (DN)? o DN is a physical therapy technique used to treat muscle pain and dysfunction. Specifically, DN helps deactivate muscle trigger points (muscle knots).  o A thin filiform needle is used to penetrate the skin and stimulate the underlying trigger point. The goal is for a local twitch response (LTR) to occur and for the trigger point to relax. No medication of any kind is injected during the procedure.   . What Does Trigger Point Dry Needling Feel Like?  o The procedure feels different for each individual patient. Some patients report that they do not actually feel the needle enter the skin and overall the process is not painful. Very mild bleeding may occur. However, many patients feel a deep cramping in the muscle in which the needle was inserted. This is the local twitch response.   . How Will I feel after the treatment? o Soreness is normal, and the onset of soreness may not occur for a few hours. Typically this soreness does not last longer than two days.  o Bruising is uncommon, however; ice can be used to decrease any possible bruising.  o In rare cases feeling tired or nauseous after the treatment is normal. In addition, your symptoms may get worse before they get better, this period will typically not last longer than 24 hours.   . What Can I do After My Treatment? o Increase your hydration by drinking more water for the next 24 hours. o You may place ice or heat on the areas treated that have become sore, however, do not use heat on inflamed or bruised areas. Heat often brings more relief post needling. o You can continue your regular activities, but vigorous activity is not recommended initially after the treatment for 24 hours. o DN is best combined with other physical therapy such as strengthening, stretching, and other therapies.      Home exercise program created by Michael [name], PT.  For questions, please  contact Michael [name] via phone at [phone] or email at [name]  [facility] [place] [address]  [address] [place], [place], [address] Phone: [phone]   Fax:  [phone]   

## 2020-01-03 ENCOUNTER — Ambulatory Visit (HOSPITAL_BASED_OUTPATIENT_CLINIC_OR_DEPARTMENT_OTHER)
Admission: RE | Admit: 2020-01-03 | Discharge: 2020-01-03 | Disposition: A | Discharge status: Home / Self Care | Payer: Managed Care, Other (non HMO) | Source: Ambulatory Visit | Attending: Allergy | Admitting: Allergy

## 2020-01-03 ENCOUNTER — Encounter: Payer: Self-pay | Admitting: Allergy

## 2020-01-03 ENCOUNTER — Ambulatory Visit (INDEPENDENT_AMBULATORY_CARE_PROVIDER_SITE_OTHER): Payer: Managed Care, Other (non HMO) | Admitting: Allergy

## 2020-01-03 ENCOUNTER — Other Ambulatory Visit: Payer: Self-pay

## 2020-01-03 VITALS — BP 132/80 | HR 69 | Temp 98.1°F | Resp 16

## 2020-01-03 DIAGNOSIS — Z8709 Personal history of other diseases of the respiratory system: Secondary | ICD-10-CM | POA: Diagnosis not present

## 2020-01-03 DIAGNOSIS — T781XXD Other adverse food reactions, not elsewhere classified, subsequent encounter: Secondary | ICD-10-CM

## 2020-01-03 DIAGNOSIS — J984 Other disorders of lung: Secondary | ICD-10-CM

## 2020-01-03 DIAGNOSIS — J3089 Other allergic rhinitis: Secondary | ICD-10-CM

## 2020-01-03 DIAGNOSIS — Z881 Allergy status to other antibiotic agents status: Secondary | ICD-10-CM

## 2020-01-03 DIAGNOSIS — J339 Nasal polyp, unspecified: Secondary | ICD-10-CM

## 2020-01-03 DIAGNOSIS — Z886 Allergy status to analgesic agent status: Secondary | ICD-10-CM | POA: Insufficient documentation

## 2020-01-03 DIAGNOSIS — J4551 Severe persistent asthma with (acute) exacerbation: Secondary | ICD-10-CM | POA: Insufficient documentation

## 2020-01-03 DIAGNOSIS — R12 Heartburn: Secondary | ICD-10-CM

## 2020-01-03 DIAGNOSIS — T39015A Adverse effect of aspirin, initial encounter: Secondary | ICD-10-CM

## 2020-01-03 NOTE — Assessment & Plan Note (Signed)
Past history - No recent allergy testing.  Interim history - stable.  Consider retesting in future.  May use over the counter antihistamines such as Zyrtec (cetirizine), Claritin (loratadine), Allegra (fexofenadine), or Xyzal (levocetirizine) daily as needed.  Continue with Nasacort 1 spray per nostril twice a day.

## 2020-01-03 NOTE — Assessment & Plan Note (Signed)
   Continue with Nasacort 1 spray per nostril twice a day.

## 2020-01-03 NOTE — Progress Notes (Signed)
Follow Up Note  RE: Michael [name] MRN: [id number] DOB: 03-02-1951 Date of Office Visit: 01/03/2020  Referring provider: Debbrah Alar, NP Primary care provider: Debbrah Alar, NP  Chief Complaint: Asthma (cough x 2 weeks that has been getting worse in the last 3-4 days. Using the nebulizer more.)  History of Present Illness: I had the pleasure of seeing Michael [name] for a follow up visit at the [facility] on 01/03/2020. He is a 69 y.o. male, who is being followed for asthma on Dupixent, reflux, allergic rhinitis, food allergy, nasal polyp. His previous allergy office visit was on 11/20/2019 with Michael [name], [place]. Today is a new complaint visit of coughing. Up to date with COVID-19 vaccine: yes  Asthma Patient had some improvement after the oral prednisone in July but the past 2 weeks noticed increased coughing, wheezing, shortness of breath.  Currently taking Incruse Ellipta 1 puff once a day, Dulera 222mcg 2 puffs BID, Dupixent 300mg  every 2 weeks at home, Singulair 10mg  daily.  Did not start Flovent as pharmacy was out of it.   Using albuterol/duoneb nebulizer 3 times a day with some benefit.  Denies any fevers or chills.   Not sure if having any sick contacts.  No Covid-19 testing during these episodes.   Michael [name] in the past. Michael [name] did not work for the full 8 weeks. Not sure if the Dupixent is working as well as [employer], [employer]. Xolair did not work at all. Never tried Nucala.  Not taking any NSAIDs.  Reflux Not taking omeprazole daily. Only takes it when on prednisone.   Allergic rhinitis Currently on Nasacort 1 spray per nostril BID and allegra daily.   Nasal polyp Did not notice any worsening symptoms.   Food allergy Currently avoiding peanut, tree nuts, and oregano.   Assessment and Plan: Michael [name] is a 69 y.o. male with: Severe persistent asthma Past history - Xolair was ineffective, Fasenra not lasting the full 8  weeks.  Interim history - Dupixent is not helping. Finished prednisone in July but now having another flare. Unable to get Flovent. Up to date COVID-19 vaccine.   Take prednisone 40mg  daily x 2 days, 30mg  daily x 2 days, 20mg  daily x 2 days and 10mg  daily x 2 days.  Get CXR - if it shows infection I will send in antibiotics.  Get COVID-19 testing and quarantine until results are back.  Start using duoneb nebulizer every 6 hours while awake for the next 5 days then only use if needed.  Add on Alvesco 198mcg 1 puff twice a day with spacer and rinse mouth afterwards for 1-2 weeks until symptoms improved. Sample given.   We will switch to Nucala injections 4 weeks at home. First injection given in the office. Will start PA process.   Daily controller medication(s): continue Dulera 275mcg 2 puffs twice a day with spacer and rinse mouth afterwards. ? Continue Incruse 1 puff daily. ? Continue Singulair 10mg  daily.  During upper respiratory infections: start Alvesco 137mcg 1 puff twice a day for 1-2 weeks until your breathing symptoms return to baseline.   May use albuterol rescue inhaler 2 puffs or nebulizer every 4 to 6 hours as needed for shortness of breath, chest tightness, coughing, and wheezing. May use albuterol rescue inhaler 2 puffs 5 to 15 minutes prior to strenuous physical activities. Monitor frequency of use.   Disorder of respiratory system exacerbated by aspirin Patient has clinical history of AERD.  Continue to avoid  NSAIDs.  Heartburn Not taking daily medications. Denies any symptoms.  Start omeprazole 20mg  daily.  Allergic rhinitis Past history - No recent allergy testing.  Interim history - stable.  Consider retesting in future.  May use over the counter antihistamines such as Zyrtec (cetirizine), Claritin (loratadine), Allegra (fexofenadine), or Xyzal (levocetirizine) daily as needed.  Continue with Nasacort 1 spray per nostril twice a day.   History of nasal  polyp  Continue with Nasacort 1 spray per nostril twice a day.   Adverse food reaction  Continue avoid peanuts, tree nuts and oregano.   For mild symptoms you can take over the counter antihistamines such as Benadryl and monitor symptoms closely. If symptoms worsen or if you have severe symptoms including breathing issues, throat closure, significant swelling, whole body hives, severe diarrhea and vomiting, lightheadedness then inject epinephrine and seek immediate medical care afterwards.  Consider testing in future.   Allergy to multiple antibiotics Past history - Patient has multiple antibiotic allergies. Most recently the bactrim caused rash. Took clindamycin for a tooth infection with no issues. History of hives with amoxicillin about 5 years ago. Resolved with benadryl.   Schedule for penicillin skin testing and challenge in future.  Return in about 2 months (around 03/04/2020).  Diagnostics: None.  Medication List:  Current Outpatient Medications  Medication Sig Dispense Refill  . albuterol (VENTOLIN HFA) 108 (90 Base) MCG/ACT inhaler INHALE 2 PUFFS INTO THE LUNGS EVERY 6 HOURS AS NEEDED FOR WHEEZING OR SHORTNESS OF BREATH 8.5 g 0  . allopurinol (ZYLOPRIM) 100 MG tablet Take 1 tablet (100 mg total) by mouth 2 (two) times daily. 180 tablet 3  . calcium carbonate (OS-CAL - DOSED IN MG OF ELEMENTAL CALCIUM) 1250 (500 Ca) MG tablet Take 1 tablet by mouth daily.    . Cholecalciferol (VITAMIN D3) 5000 units CAPS Take by mouth.    . clotrimazole-betamethasone (LOTRISONE) cream Apply 1 application topically 2 (two) times daily. 30 g 0  . docusate sodium (COLACE) 100 MG capsule Take 200 mg by mouth daily.     . DUPIXENT 300 MG/2ML prefilled syringe     . EPINEPHrine 0.3 mg/0.3 mL IJ SOAJ injection Use as directed for severe allergic reactions 4 each 3  . fexofenadine (ALLEGRA) 180 MG tablet Take 180 mg by mouth daily.     . INCRUSE ELLIPTA 62.5 MCG/INH AEPB INHALE 1 PUFF INTO THE  LUNGS DAILY 30 each 5  . ipratropium-albuterol (DUONEB) 0.5-2.5 (3) MG/3ML SOLN 1 vial in nebulizer every 4-6 hours while awake for the next 3-5 days for coughing or wheezing. 360 mL 1  . levothyroxine (SYNTHROID) 75 MCG tablet TAKE 1 TABLET(75 MCG) BY MOUTH DAILY BEFORE BREAKFAST 90 tablet 1  . losartan (COZAAR) 50 MG tablet Take 0.5 tablets (25 mg total) by mouth daily. TAKE 1 TABLET(50 MG) BY MOUTH DAILY 90 tablet 1  . Magnesium 250 MG TABS Take by mouth.    . mometasone-formoterol (DULERA) 200-5 MCG/ACT AERO Inhale 2 puffs into the lungs 2 (two) times daily. 13 g 3  . montelukast (SINGULAIR) 10 MG tablet TAKE 1 TABLET(10 MG) BY MOUTH AT BEDTIME 30 tablet 1   Current Facility-Administered Medications  Medication Dose Route Frequency Provider Last Rate Last Admin  . 0.9 %  sodium chloride infusion  500 mL Intravenous Once Gatha Mayer, MD      . Benralizumab SOSY 30 mg  30 mg Subcutaneous Q8 Weeks Bobbitt, [place] Muta, MD   30 mg at 07/12/19 1546  Allergies: Allergies  Allergen Reactions  . Baclofen Shortness Of Breath    Asthma exacerbation  . Colchicine Other (See Comments)    Pancreatitis  . Doxycycline Hives  . Statins Other (See Comments)    Joint stiffness  . Avelox [Moxifloxacin Hcl In Nacl] Other (See Comments)    SEIZURE  . Bactrim [Sulfamethoxazole-Trimethoprim] Hives  . Cefdinir Hives  . Oregano [Origanum Oil] Cough  . Peanut-Containing Drug Products Swelling    Throat swelling  . Adhesive  [Tape] Other (See Comments)    "tears skin"  . Penicillins Rash   I reviewed his past medical history, social history, family history, and environmental history and no significant changes have been reported from his previous visit.  Review of Systems  Constitutional: Negative for appetite change, chills, fever and unexpected weight change.  HENT: Negative for congestion, postnasal drip, rhinorrhea and sinus pressure.   Eyes: Negative for itching.  Respiratory: Positive for  cough, chest tightness, shortness of breath and wheezing.   Gastrointestinal: Negative for abdominal pain.  Skin: Negative for rash.  Allergic/Immunologic: Positive for environmental allergies and food allergies.  Neurological: Negative for headaches.   Objective: BP 132/80   Pulse 69   Temp 98.1 F (36.7 C) (Oral)   Resp 16   SpO2 97%  There is no height or weight on file to calculate BMI. Physical Exam Vitals and nursing note reviewed.  Constitutional:      Appearance: Normal appearance. He is well-developed.  HENT:     Head: Normocephalic and atraumatic.     Right Ear: External ear normal.     Left Ear: External ear normal.     Nose: Nose normal.     Mouth/Throat:     Mouth: Mucous membranes are moist.     Pharynx: Oropharynx is clear.  Eyes:     Conjunctiva/sclera: Conjunctivae normal.  Cardiovascular:     Rate and Rhythm: Normal rate and regular rhythm.     Heart sounds: Normal heart sounds. No murmur heard.  No friction rub. No gallop.   Pulmonary:     Effort: Pulmonary effort is normal.     Breath sounds: No wheezing, rhonchi or rales.     Comments: Decreased breath sounds throughout. Musculoskeletal:     Cervical back: Neck supple.  Skin:    General: Skin is warm.     Findings: No rash.  Neurological:     Mental Status: He is alert and oriented to person, place, and time.  Psychiatric:        Behavior: Behavior normal.    Previous notes and tests were reviewed. The plan was reviewed with the patient/family, and all questions/concerned were addressed.  It was my pleasure to see Michael [name] today and participate in his care. Please feel free to contact me with any questions or concerns.  Sincerely,  Rexene Alberts, DO Allergy & Immunology  [facility] office: [phone] [facility] office: [place] office: [phone]

## 2020-01-03 NOTE — Assessment & Plan Note (Signed)
Patient has clinical history of AERD.  Continue to avoid NSAIDs.

## 2020-01-03 NOTE — Assessment & Plan Note (Signed)
Not taking daily medications. Denies any symptoms.  Start omeprazole 20mg  daily.

## 2020-01-03 NOTE — Patient Instructions (Addendum)
Severe persistent asthma  Take prednisone 40mg  daily x 2 days, 30mg  daily x 2 days, 20mg  daily x 2 days and 10mg  daily x 2 days.  Get CXR - if it shows infection I will send in antibiotics.  Get COVID-19 testing and quarantine until results are back.  Start using duoneb nebulizer every 6 hours while awake for the next 5 days then only use if needed.  Add on Alvesco 120mcg 1 puff twice a day with spacer and rinse mouth afterwards for 1-2 weeks until symptoms improved. Sample given.   We will switch to Nucala injections - 1 every 4 weeks at home. First injection given in the office. Will let Tammy know and she will start the paperwork.   Daily controller medication(s): continue Dulera 270mcg 2 puffs twice a day with spacer and rinse mouth afterwards. ? Continue Incruse 1 puff daily. ? Continue Singulair 10mg  daily. ? During upper respiratory infections/asthma flares: start Alvesco 113mcg 1 puff twice a day for 1-2 weeks until your breathing symptoms return to baseline.  May use albuterol rescue inhaler 2 puffs every 4 to 6 hours as needed for shortness of breath, chest tightness, coughing, and wheezing. May use albuterol rescue inhaler 2 puffs 5 to 15 minutes prior to strenuous physical activities. Monitor frequency of use.  Asthma control goals:  Full participation in all desired activities (may need albuterol before activity) Albuterol use two times or less a week on average (not counting use with activity) Cough interfering with sleep two times or less a month Oral steroids no more than once a year No hospitalizations  Reflux  Start omeprazole 20mg  daily.  Disorder of respiratory system exacerbated by aspirin  Continue to avoid NSAIDs.  History of nasal polyp  Continue with Nasacort 1 spray per nostril twice a day.   Allergic rhinitis   Consider retesting in future.  May use over the counter antihistamines such as Zyrtec (cetirizine), Claritin (loratadine), Allegra  (fexofenadine), or Xyzal (levocetirizine) daily as needed.  Adverse food reaction  Continue avoid peanuts, tree nuts and oregano.   For mild symptoms you can take over the counter antihistamines such as Benadryl and monitor symptoms closely. If symptoms worsen or if you have severe symptoms including breathing issues, throat closure, significant swelling, wholeb ody hives, severe diarrhea and vomiting, lightheadedness then inject epinephrine and seek immediate medical care afterwards.  Consider testing in future.   Penicillin allergy  Consider testing in the future.   Follow up in 2 months or sooner if needed.    After September 2021, I will not be in the [facility] office on a regular basis.   You can continue your care and follow up with Dr. Verlin Fester or nurse practitioner Gareth Morgan in [facility].  OR you can follow up with me in our [place] ([address]) or [employer] [phone]) location.  Sincerely,  Rexene Alberts, DO  [facility] [place] [facility] office: [phone] [facility] office: [place] office: [phone]

## 2020-01-03 NOTE — Assessment & Plan Note (Signed)
Past history - Xolair was ineffective, Fasenra not lasting the full 8 weeks.  Interim history - Dupixent is not helping. Finished prednisone in July but now having another flare. Unable to get Flovent. Up to date COVID-19 vaccine.   Take prednisone 40mg  daily x 2 days, 30mg  daily x 2 days, 20mg  daily x 2 days and 10mg  daily x 2 days.  Get CXR - if it shows infection I will send in antibiotics.  Get COVID-19 testing and quarantine until results are back.  Start using duoneb nebulizer every 6 hours while awake for the next 5 days then only use if needed.  Add on Alvesco 158mcg 1 puff twice a day with spacer and rinse mouth afterwards for 1-2 weeks until symptoms improved. Sample given.   We will switch to Nucala injections 4 weeks at home. First injection given in the office. Will start PA process.   Daily controller medication(s): continue Dulera 225mcg 2 puffs twice a day with spacer and rinse mouth afterwards. ? Continue Incruse 1 puff daily. ? Continue Singulair 10mg  daily.  During upper respiratory infections: start Alvesco 14mcg 1 puff twice a day for 1-2 weeks until your breathing symptoms return to baseline.   May use albuterol rescue inhaler 2 puffs or nebulizer every 4 to 6 hours as needed for shortness of breath, chest tightness, coughing, and wheezing. May use albuterol rescue inhaler 2 puffs 5 to 15 minutes prior to strenuous physical activities. Monitor frequency of use.

## 2020-01-03 NOTE — Assessment & Plan Note (Signed)
Past history - Patient has multiple antibiotic allergies. Most recently the bactrim caused rash. Took clindamycin for a tooth infection with no issues. History of hives with amoxicillin about 5 years ago. Resolved with benadryl.   Schedule for penicillin skin testing and challenge in future.

## 2020-01-03 NOTE — Assessment & Plan Note (Signed)
   Continue avoid peanuts, tree nuts and oregano.   For mild symptoms you can take over the counter antihistamines such as Benadryl and monitor symptoms closely. If symptoms worsen or if you have severe symptoms including breathing issues, throat closure, significant swelling, whole body hives, severe diarrhea and vomiting, lightheadedness then inject epinephrine and seek immediate medical care afterwards.  Consider testing in future.

## 2020-01-06 ENCOUNTER — Ambulatory Visit: Payer: Managed Care, Other (non HMO) | Admitting: Physical Therapy

## 2020-01-06 ENCOUNTER — Telehealth: Payer: Self-pay | Admitting: *Deleted

## 2020-01-06 NOTE — Telephone Encounter (Signed)
L/m for patient to reach out to me to advise approval and submit for Nucala autoinjector.  I will go ahead and submit enrollment form to [facility] and they will reach out to patient in order to get copay card for same

## 2020-01-06 NOTE — Telephone Encounter (Signed)
-----   Message from Garnet Sierras, DO sent at 01/03/2020  1:29 PM EDT ----- Lynelle Smoke, We are going to switch this patient from [place] to [facility] 100mg  every 4 weeks. Prefers to self inject at home. Dupixent is not working. Failed Xolair and Berna Bue did not last the full 8 week. Can you start PA? Thank you.

## 2020-01-06 NOTE — Telephone Encounter (Signed)
Per [facility] patient is ineligible for copay card for Nucala due to age/MCR part A so that may be issue if patient cannot afford

## 2020-01-07 NOTE — Telephone Encounter (Signed)
Spoke to patient and will submit Rx to [facility] for Nucala and advised patient that he does not qualify for copay card so [facility] will have to advised copay amount and if patient unable to afford same to reach back out to  me

## 2020-01-09 ENCOUNTER — Encounter: Payer: Managed Care, Other (non HMO) | Admitting: Physical Therapy

## 2020-01-09 ENCOUNTER — Encounter: Payer: Self-pay | Admitting: Allergy

## 2020-01-10 ENCOUNTER — Telehealth: Payer: Self-pay | Admitting: Family Medicine

## 2020-01-10 ENCOUNTER — Telehealth: Payer: Self-pay

## 2020-01-10 MED ORDER — AZITHROMYCIN 250 MG PO TABS: ORAL_TABLET | ORAL | 0 refills | Status: DC

## 2020-01-10 MED ORDER — PREDNISONE 10 MG PO TABS: ORAL_TABLET | ORAL | 0 refills | Status: DC

## 2020-01-10 NOTE — Telephone Encounter (Signed)
[name], Michael Mancia "[address]  [name], DO 21 hours ago (3:57 PM)   I took a COVID PCR test on Monday at [facility] and it was negative when the results returned today.   Im still coughing deeply and Im concerned about chest congestion. In addition to all of our plans, Im taking Mucinex all day/night for coughing and still using the nebulizer.   Is it possible that its a sinus infection or just plain old bronchitis? I just dont seem to be improving except when I was taking 30-40 mg of prednisone.   Thanks for your help.

## 2020-01-10 NOTE — Telephone Encounter (Signed)
Patient reports that he has received a negative covid test today, however, he reports no change in his symptoms which include nasal congestion on the left side, thick green nasal drainage for the last week, thick post nasal drainage, shortness of breath and wheeze. He reports that, in addition to his scheduled medications, he continues nasal saline rinses several times a day as well as Mucinex twice a day. He is using his albuterol inhaler several times a day with relief for a few hours after use. He denies fever, chills, and sweats. He agrees to continue the current therapy in addition to azithromycin and an extended prednisone taper. He will call the clinic if his symptoms worsen or he develops a fever.

## 2020-01-13 ENCOUNTER — Encounter: Payer: Self-pay | Admitting: Physical Therapy

## 2020-01-13 ENCOUNTER — Other Ambulatory Visit: Payer: Self-pay

## 2020-01-13 ENCOUNTER — Ambulatory Visit: Payer: Managed Care, Other (non HMO) | Admitting: Physical Therapy

## 2020-01-13 DIAGNOSIS — M546 Pain in thoracic spine: Secondary | ICD-10-CM

## 2020-01-13 DIAGNOSIS — M542 Cervicalgia: Secondary | ICD-10-CM

## 2020-01-13 DIAGNOSIS — R293 Abnormal posture: Secondary | ICD-10-CM

## 2020-01-13 DIAGNOSIS — M5413 Radiculopathy, cervicothoracic region: Secondary | ICD-10-CM

## 2020-01-13 DIAGNOSIS — M62830 Muscle spasm of back: Secondary | ICD-10-CM

## 2020-01-13 NOTE — Patient Instructions (Addendum)
Home exercise program created by Annie Paras, PT.  For questions, please contact Robynne Roat via phone at [phone] or email at [email or web address]  [place] [address]  [place], [place], [address] Phone: [phone]   Fax:  [phone]      TENS UNIT  This is helpful for muscle pain and spasm.   Search and Purchase a TENS 7000 2nd edition at www.tenspros.com or www.amazon.com  (It should be less than $30)     TENS unit instructions:   Do not shower or bathe with the unit on  Turn the unit off before removing electrodes or batteries  If the electrodes lose stickiness add a drop of water to the electrodes after they are disconnected from the unit and place on plastic sheet. If you continued to have difficulty, call the TENS unit company to purchase more electrodes.  Do not apply lotion on the skin area prior to use. Make sure the skin is clean and dry as this will help prolong the life of the electrodes.  After use, always check skin for unusual red areas, rash or other skin difficulties. If there are any skin problems, does not apply electrodes to the same area.  Never remove the electrodes from the unit by pulling the wires.  Do not use the TENS unit or electrodes other than as directed.  Do not change electrode placement without consulting your therapist or physician.  Keep 2 fingers with between each electrode.    TENS stands for Transcutaneous Electrical Nerve Stimulation. In other words, electrical impulses are allowed to pass through the skin in order to excite a nerve.   Purpose and Use of TENS:  TENS is a method used to manage acute and chronic pain without the use of drugs. It has been effective in managing pain associated with surgery, sprains, strains, trauma, rheumatoid arthritis, and neuralgias. It is a non-addictive, low risk, and non-invasive technique used to control pain. It  is not, by any means, a curative form of treatment.   How TENS Works:  Most TENS units are a Paramedic unit powered by one 9 volt battery. Attached to the outside of the unit are two lead wires where two pins and/or snaps connect on each wire. All units come with a set of four reusable pads or electrodes. These are placed on the skin surrounding the area involved. By inserting the leads into  the pads, the electricity can pass from the unit making the circuit complete.  As the intensity is turned up slowly, the electrical current enters the body from the electrodes through the skin to the surrounding nerve fibers. This triggers the release of hormones from within the body. These hormones contain pain relievers. By increasing the circulation of these hormones, the person's pain may be lessened. It is also believed that the electrical stimulation itself helps to block the pain messages being sent to the brain, thus also decreasing the body's perception of pain.   Hazards:  TENS units are NOT to be used by patients with PACEMAKERS, DEFIBRILLATORS, DIABETIC PUMPS, PREGNANT WOMEN, and patients with SEIZURE DISORDERS.  TENS units are NOT to be used over the heart, throat, brain, or spinal cord.  One of the major side effects from the TENS unit may be skin irritation. Some people may develop a rash if they are sensitive to the materials used in the electrodes or the connecting wires.   Wear the unit for 30-45 minutes at  time, up to 3-4x/day as needed for pain.   Avoid overuse due the body getting used to the stem making it not as effective over time.

## 2020-01-13 NOTE — Therapy (Addendum)
[facility] [address]  [place], [place], [address] Phone: [phone]   Fax:  [phone]  Physical Therapy Treatment / Discharge Summary  Patient Details  Name: Michael [name] MRN: [id number] Date of Birth: Apr 20, 1951 Referring Provider (PT): Eunice Blase, MD   Encounter Date: 01/13/2020   PT End of Session - 01/13/20 0800    Visit Number 2    Number of Visits 12    Date for PT Re-Evaluation 02/12/20    Authorization Type [employer]    PT Start Time 0800    PT Stop Time 0905    PT Time Calculation (min) 65 min    Activity Tolerance Patient tolerated treatment well    Behavior During Therapy [facility] for tasks assessed/performed           Past Medical History:  Diagnosis Date  . Allergy   . Arthritis   . Asthma   . Cancer (HCC)    basal cell and squamous cell carcinoma  . Cancer [facility])    prostate  . Clotting disorder ([place])   . Colon polyps   . GERD (gastroesophageal reflux disease)   . Gout   . High cholesterol   . History of hepatitis    due to drug interaction?  . History of pancreatitis 2016   ?due to colchicine  . History of pulmonary embolus (PE) 2016  . History of stomach ulcers   . Hypertension   . Hypothyroidism   . OSA (obstructive sleep apnea) 2015   has CPAP  . Pneumonia   . Sleep apnea   . Thyroid disease    hypothyroid  . Urinary incontinence     Past Surgical History:  Procedure Laterality Date  . BASAL CELL CARCINOMA EXCISION  2017   MOHS.  beneath right eye  . CHOLECYSTECTOMY  2001  . COLONOSCOPY     multiple - last 06/2017  . HIP SURGERY    . PENILE PROSTHESIS IMPLANT  2016  . PROSTATECTOMY  2009  . SINUS SURGERY WITH INSTATRAK  2014  . SQUAMOUS CELL CARCINOMA EXCISION  01/2017   nose  . TOOTH EXTRACTION    . TOTAL HIP ARTHROPLASTY  06/2016    There were no vitals filed for this visit.   Subjective Assessment - 01/13/20 0804    Subjective Pt reports his  neck & scapular pain has been better but has been on high dose steroids for a respiratory infection. Feels like the HEP has also been helping. Notes a lot of stress in his life right now.    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  No sign of compression fracture or neoplasm.  09/10/19 Thoracic xray: Mild levo convexity and spinal degenerative change. No sign of acute fracture or malalignment.    Patient Stated Goals "to be able to sleep at night"    Currently in Pain? No/denies    Pain Onset More than a month ago                             [facility] Adult PT Treatment/Exercise - 01/13/20 0800      Self-Care   Self-Care Other  Self-Care Comments    Other Self-Care Comments  Info on home TENS unit option, TENS set-up and use including      Exercises   Exercises Neck      Neck Exercises: Machines for Strengthening   UBE (Upper Arm Bike) L1.5 x 6' (3' fwd/3' back)      Modalities   Modalities Electrical engineer Stimulation Location B UT/LS and medial scapular muscles    Electrical Stimulation Action IFC    Electrical Stimulation Parameters 80-150 Hz, intensity to pt tol x 15'    Electrical Stimulation Goals Pain;Tone      Manual Therapy   Manual Therapy Soft tissue mobilization;Myofascial release;Scapular mobilization    Manual therapy comments skilled palpation and monitoring during DN    Soft tissue mobilization STM/DTM to B UT, LS and medial/lateral scapular muscles    Myofascial Release manual TPR to L>R UT, LS, rhomboids & subscapularis    Scapular Mobilization L scapular release      Neck Exercises: Stretches   Upper Trapezius Stretch Left;Right;30  seconds;1 rep    Upper Trapezius Stretch Limitations hand under hip    Levator Stretch Left;Right;30 seconds;1 rep    Levator Stretch Limitations hand behind hip    Other Neck Stretches Rhomboid stretch 3 x 30 sec            Trigger Point Dry Needling - 01/13/20 0800    Consent Given? Yes    Education Handout Provided Previously provided    Muscles Treated Head and Neck Upper trapezius;Levator scapulae   bilateral   Muscles Treated Upper Quadrant Subscapularis   bilateral   Upper Trapezius Response Twitch reponse elicited;Palpable increased muscle length    Levator Scapulae Response Twitch response elicited;Palpable increased muscle length    Subscapularis Response Twitch response elicited;Palpable increased muscle length                PT Education - 01/13/20 0900    Education Details Info on home TENS unit options, HEP update - posterior capsule stretch    Person(s) Educated Patient    Methods Explanation;Demonstration;Verbal cues;Handout    Comprehension Verbalized understanding;Verbal cues required;Returned demonstration;Need further instruction            PT Short Term Goals - 01/13/20 0803      PT SHORT TERM GOAL #1   Title Patient to be independent with initial HEP.    Status Achieved   01/13/20   Target Date --             PT Long Term Goals - 01/13/20 0803      PT LONG TERM GOAL #1   Title Patient to be independent with ongoing/advanced HEP.    Status On-going    Target Date 02/12/20      PT LONG TERM GOAL #2   Title Patient to demonstrate cervical AROM WFL without pain provocation.    Status On-going    Target Date 02/12/20      PT LONG TERM GOAL #3   Title Patient to report no tenderness with palpation over L periscapular musculature.    Status On-going    Target Date 02/12/20      PT LONG TERM GOAL #4   Title Patient to report 75% reduction in pain while reading or standing while cooking.    Status On-going    Target Date 02/12/20       PT LONG TERM GOAL #5   Title  Patient will report no sleep disturbance due to L neck or periscapular pain.    Status On-going    Target Date 02/12/20                 Plan - 01/13/20 0806    Clinical Impression Statement Hassell Done reporting less pain today which he attributes to a combination of being on high-dose steroids for a respiratory infection as well as performing the HEP. His primary c/o is still with pain mostly in the L peri/subscapular area with increased muscle tension persisting L>R in UT, LS and periscapular muscles - addressed with manual therapy incorporating DN upon informed patient consent with good twitch responses elicited resulting in palpable reduction in muscle tension and patient noting decreased L shoulder/scapular pain. Reviewed initial HEP and added posterior capsule stretch with minor clarification necessary but otherwise good return demonstration - he notes the best stretches with LS and rhomboid stretches. Session concluded with trial of estim to promote further muscle relaxation and pain reduction - pt noting good relief, therefore provided pt with options for home TENS unit. Will plan for progression of postural/periscapular strengthening next visit.    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    Examination-Activity Limitations Sleep;Stand;Carry;Reach Overhead    Examination-Participation Restrictions Occupation;Meal Prep;Yard Work;Community Activity;Laundry;Cleaning;Driving    Rehab Potential Good    PT Frequency 2x / week    PT Duration 6 weeks    PT Treatment/Interventions ADLs/Self Care Home Management;Cryotherapy;Electrical Stimulation;Iontophoresis 4mg /ml Dexamethasone;Moist Heat;Traction;Ultrasound;Functional  mobility training;Therapeutic activities;Therapeutic exercise;Neuromuscular re-education;Patient/family education;Manual techniques;Passive range of motion;Dry needling;Taping;Spinal Manipulations;Joint Manipulations    PT Next Visit Plan assess extended response to DN; postural stretching/strengthening; manual therapy with DN as indicated for L periscapular muscles; modalities including estim or trial of cervical mechanical traction (per MD referral)    Consulted and Agree with Plan of Care Patient           Patient will benefit from skilled therapeutic intervention in order to improve the following deficits and impairments:  Decreased activity tolerance, Decreased endurance, Decreased range of motion, Decreased mobility, Decreased strength, Increased fascial restricitons, Increased muscle spasms, Impaired flexibility, Improper body mechanics, Postural dysfunction, Impaired UE functional use, Pain  Visit Diagnosis: Radiculopathy, cervicothoracic region  Cervicalgia  Pain in thoracic spine  Muscle spasm of back  Abnormal posture     Problem List Patient Active Problem List   Diagnosis Date Noted  . Heartburn 01/03/2020  . Disorder of respiratory system exacerbated by aspirin 04/12/2019  . Adverse food reaction 04/12/2019  . Left knee pain 08/22/2017  . Hypothyroid 03/24/2017  . Hyperlipidemia 03/24/2017  . Gout 03/24/2017  . Squamous cell carcinoma 03/24/2017  . History of pulmonary embolism 03/24/2017  . Prostate cancer ([place]) 03/24/2017  . OSA (obstructive sleep apnea) 03/24/2017  . History of colon polyps 03/24/2017  . Hypertension 03/24/2017  . Severe persistent asthma 02/09/2017  . Allergic rhinitis 02/09/2017  . Chronic sinusitis 02/09/2017  . History of nasal polyp 02/09/2017  . Allergy to multiple antibiotics 02/09/2017    Percival Spanish, PT, MPT 01/13/2020, 12:06 PM  [facility] [address]  [place], [place], [address] Phone: [phone]   Fax:  [phone]  Name: Bowdy Bair MRN: [id number] Date of Birth: [birth year]   PHYSICAL THERAPY  DISCHARGE SUMMARY  Visits from Start of Care: 2  Current functional level related to goals / functional outcomes:   Refer to above clinical impression for status as of last visit on 01/13/2020. Patient was placed on hold for 30 days at his request due to having to care for his brother with brain cancer and has not returned to PT in >30 days, therefore will proceed with discharge from PT for this episode.   Remaining deficits:   As above.   Education / Equipment:   HEP  Plan: Patient agrees to discharge.  Patient goals were partially met. Patient is being discharged due to the patient's request.  ?????     Percival Spanish, PT, MPT 02/28/20, 9:22 AM  [facility] [address]  [place], [place], [address] Phone: [phone]   Fax:  [phone]

## 2020-01-16 ENCOUNTER — Ambulatory Visit: Payer: Managed Care, Other (non HMO)

## 2020-01-17 ENCOUNTER — Ambulatory Visit: Payer: Self-pay | Admitting: Allergy

## 2020-01-20 ENCOUNTER — Ambulatory Visit: Payer: Managed Care, Other (non HMO)

## 2020-01-23 ENCOUNTER — Encounter: Payer: Managed Care, Other (non HMO) | Admitting: Physical Therapy

## 2020-01-27 ENCOUNTER — Ambulatory Visit: Payer: Managed Care, Other (non HMO)

## 2020-01-30 ENCOUNTER — Encounter: Payer: Managed Care, Other (non HMO) | Admitting: Physical Therapy

## 2020-01-30 ENCOUNTER — Telehealth: Payer: Self-pay | Admitting: Family Medicine

## 2020-01-30 DIAGNOSIS — M25512 Pain in left shoulder: Secondary | ICD-10-CM

## 2020-01-30 DIAGNOSIS — G8929 Other chronic pain: Secondary | ICD-10-CM

## 2020-01-30 NOTE — Telephone Encounter (Signed)
Please advise 

## 2020-01-30 NOTE — Telephone Encounter (Signed)
Patient sent mychart message stating ongoing shoulder pain  Worsening condition. Patient want to know what is the next step? The number to contact patient is [phone]

## 2020-01-30 NOTE — Telephone Encounter (Signed)
I ordered an MRI

## 2020-01-31 MED ORDER — TIZANIDINE HCL 2 MG PO TABS: 2.0000 mg | ORAL_TABLET | Freq: Every evening | ORAL | 1 refills | Status: DC | PRN

## 2020-01-31 NOTE — Addendum Note (Signed)
Addended by: Hortencia Pilar on: 01/31/2020 10:09 AM   Modules accepted: Orders

## 2020-01-31 NOTE — Telephone Encounter (Signed)
I called and advised the patient of the new muscle relaxer that was sent to his pharmacy.

## 2020-01-31 NOTE — Telephone Encounter (Signed)
I called and advised him of the MRI Csp being ordered for him.   Is there an alternative to the baclofen that he could take? He is not able to get any sleep (could not take the baclofen due to side effects).

## 2020-01-31 NOTE — Telephone Encounter (Signed)
Zanaflex Rx sent.

## 2020-02-01 ENCOUNTER — Other Ambulatory Visit: Payer: Self-pay | Admitting: Allergy

## 2020-02-04 ENCOUNTER — Encounter: Payer: Self-pay | Admitting: Family Medicine

## 2020-02-04 ENCOUNTER — Other Ambulatory Visit: Payer: Self-pay

## 2020-02-04 MED ORDER — DULERA 200-5 MCG/ACT IN AERO: 2.0000 | INHALATION_SPRAY | Freq: Two times a day (BID) | RESPIRATORY_TRACT | 0 refills | Status: DC

## 2020-02-06 ENCOUNTER — Encounter: Payer: Managed Care, Other (non HMO) | Admitting: Physical Therapy

## 2020-02-10 ENCOUNTER — Encounter: Payer: Managed Care, Other (non HMO) | Admitting: Physical Therapy

## 2020-02-14 ENCOUNTER — Other Ambulatory Visit: Payer: Self-pay

## 2020-02-14 ENCOUNTER — Encounter: Payer: Self-pay | Admitting: Family Medicine

## 2020-02-14 ENCOUNTER — Ambulatory Visit (INDEPENDENT_AMBULATORY_CARE_PROVIDER_SITE_OTHER): Payer: Managed Care, Other (non HMO) | Admitting: Family Medicine

## 2020-02-14 ENCOUNTER — Telehealth: Payer: Self-pay | Admitting: Family Medicine

## 2020-02-14 VITALS — BP 150/86 | HR 70 | Temp 97.5°F | Resp 16

## 2020-02-14 DIAGNOSIS — K219 Gastro-esophageal reflux disease without esophagitis: Secondary | ICD-10-CM

## 2020-02-14 DIAGNOSIS — J4551 Severe persistent asthma with (acute) exacerbation: Secondary | ICD-10-CM

## 2020-02-14 DIAGNOSIS — B9689 Other specified bacterial agents as the cause of diseases classified elsewhere: Secondary | ICD-10-CM

## 2020-02-14 DIAGNOSIS — Z8709 Personal history of other diseases of the respiratory system: Secondary | ICD-10-CM

## 2020-02-14 DIAGNOSIS — J309 Allergic rhinitis, unspecified: Secondary | ICD-10-CM | POA: Diagnosis not present

## 2020-02-14 DIAGNOSIS — J0100 Acute maxillary sinusitis, unspecified: Secondary | ICD-10-CM | POA: Diagnosis not present

## 2020-02-14 DIAGNOSIS — H109 Unspecified conjunctivitis: Secondary | ICD-10-CM

## 2020-02-14 DIAGNOSIS — T781XXD Other adverse food reactions, not elsewhere classified, subsequent encounter: Secondary | ICD-10-CM

## 2020-02-14 DIAGNOSIS — Z881 Allergy status to other antibiotic agents status: Secondary | ICD-10-CM

## 2020-02-14 MED ORDER — TOBRADEX 0.3-0.1 % OP OINT: 1.0000 "application " | TOPICAL_OINTMENT | Freq: Three times a day (TID) | OPHTHALMIC | 0 refills | Status: AC

## 2020-02-14 MED ORDER — AZITHROMYCIN 250 MG PO TABS: ORAL_TABLET | ORAL | 0 refills | Status: DC

## 2020-02-14 NOTE — Telephone Encounter (Signed)
Pt. Coming in at 3:30 pm today to see Webb Silversmith.

## 2020-02-14 NOTE — Telephone Encounter (Signed)
You did a telephone visit with pt. 01/10/2020. Talked with pt. He thought he was getting better, then this past Monday he could feel he was getting some sinus pressure and colored mucus, but he thought he could take care of it through doing his sinus rinses and the mucinex. Pt. States these last 2 days it's come back with a vengeance. Coughing,wheezing,coughing up yellow/green mucus. He's done breathing treatments the last 2 nights. Tremendous pressure over (L) eye where he needs to take tylenol. No fever. Please Advise

## 2020-02-14 NOTE — Progress Notes (Signed)
[place] [id number] Dept: [phone]  FOLLOW UP NOTE  Patient ID: Michael [name], male    DOB: November 12, 1950  Age: 69 y.o. MRN: [id number] Date of Office Visit: 02/14/2020  Assessment  Chief Complaint: Facial Pain and Shortness of Breath  HPI Michael [name] is a 69 year old male who presents to the clinic for evaluation of acute sinusitis and asthma exacerbation.  He was last seen in this clinic on 01/03/2020 by Dr. Maudie Mercury for evaluation of asthma, allergic rhinitis, nasal polyp, reflux, and food allergy to peanut, tree nut, and oregano.  At today's visit he reports his asthma had been well controlled until yesterday with symptoms including shortness of breath with activity and cough producing yellow to green thick mucus.  He continues Dulera 200-2 puffs twice a day, Incruse Ellipta 1 puff once a day, montelukast 10 mg once a day, and albuterol.  He reports that he did use DuoNeb via nebulizer yesterday with moderate relief of symptoms.  He has previously tried Xolair, Engineer, mining, and Dupixent and is currently in the process of beginning [place].  He reports right-sided facial pain and thick green mucus from both nares.  He continues Nasacort and nasal saline rinses.  He reports matted, thick, green drainage from both eyes for the last 3 days.  He denies vision change or eye pain.  He reports that reflux is well controlled with omeprazole daily.  He continues to avoid peanut, tree nut, and oregano.  He is interested in participating in a penicillin challenge when he is feeling better.  His current medications are listed in the chart.   Drug Allergies:  Allergies  Allergen Reactions   Baclofen Shortness Of Breath    Asthma exacerbation   Colchicine Other (See Comments)    Pancreatitis   Doxycycline Hives   Statins Other (See Comments)    Joint stiffness   Avelox [Moxifloxacin Hcl In Nacl] Other (See Comments)    SEIZURE   Bactrim  [Sulfamethoxazole-Trimethoprim] Hives   Cefdinir Hives   Oregano [Origanum Oil] Cough   Peanut-Containing Drug Products Swelling    Throat swelling   Adhesive  [Tape] Other (See Comments)    "tears skin"   Penicillins Rash    Physical Exam: BP (!) 150/86    Pulse 70    Temp (!) 97.5 F (36.4 C) (Temporal)    Resp 16    SpO2 97%    Physical Exam Vitals reviewed.  Constitutional:      Appearance: Normal appearance.  HENT:     Head: Normocephalic and atraumatic.     Right Ear: Tympanic membrane normal.     Left Ear: Tympanic membrane normal.     Nose:     Comments: Bilateral nares erythematous with thick green nasal drainage noted.  Pharynx slightly erythematous with no exudate.  Ears normal.  Bilateral  eyes with matted green drainage.  Bilateral eyes slightly injected Eyes:     General:        Right eye: Discharge present.        Left eye: Discharge present. Cardiovascular:     Rate and Rhythm: Normal rate and regular rhythm.     Heart sounds: Normal heart sounds. No murmur heard.   Pulmonary:     Effort: Pulmonary effort is normal.     Breath sounds: Normal breath sounds.     Comments: Lungs clear to auscultation Musculoskeletal:        General: Normal range of motion.  Cervical back: Normal range of motion and neck supple.  Skin:    General: Skin is warm and dry.  Neurological:     Mental Status: He is alert and oriented to person, place, and time.  Psychiatric:        Mood and Affect: Mood normal.        Behavior: Behavior normal.        Thought Content: Thought content normal.        Judgment: Judgment normal.     Diagnostics: FVC 3.06, FEV1 1.87.  Predicted FVC 3.90, predicted FEV1 2.89.  Spirometry indicates mild restriction and mild airway obstruction.  Postbronchodilator FVC 3.13, FEV1 1.94.  Postbronchodilator therapy indicates mild airway obstruction with 2% improvement in FVC and 4% improvement in FEV1.  Assessment and Plan: 1. Severe  persistent asthma with acute exacerbation   2. Acute maxillary sinusitis, recurrence not specified   3. Bacterial conjunctivitis of both eyes   4. Allergic rhinitis, unspecified seasonality, unspecified trigger   5. 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 Adverse food reaction, subsequent encounter   7. Gastroesophageal reflux disease, unspecified whether esophagitis present   8. History of nasal polyp     Meds ordered this encounter  Medications   tobramycin-dexamethasone (TOBRADEX) ophthalmic ointment    Sig: Place 1 application into both eyes 3 (three) times daily for 7 days.    Dispense:  3.5 g    Refill:  0   azithromycin (ZITHROMAX) 250 MG tablet    Sig: Take 2 tablets on the first, then take 1 tablet once a day    Dispense:  10 each    Refill:  0    Patient Instructions  Asthma Continue Dulera 200-2 puffs twice a day with a spacer to prevent cough or wheeze Continue Incruse Ellipta 1 puff once a day to prevent cough or wheeze Continue montelukast 10 mg once a day to prevent cough or wheeze Continue Flovent 110-2 puffs twice a day with a spacer for 2 more weeks Continue Nucala approval process Continue albuterol 2 puffs every 4 hours as needed for cough or wheeze OR Instead use albuterol 0.083% solution via nebulizer one unit vial every 4 hours as needed for cough or wheeze  Acute sinusitis Begin azithromycin.  Take 2 tablets on the first day and continue with 1 tablet. If not effective we will consider clindamycin Continue nasal saline rinses frequently Continue Nasacort 2 sprays in each nostril once a day  Allergic rhinitis  Continue antihistamine once a day as needed for runny nose. Remember to rotate to a different antihistamine about every 3 months. Some examples of over the counter antihistamines include Zyrtec (cetirizine), Xyzal (levocetirizine), Allegra (fexofenadine), and Claritin (loratidine).  Continue Flonase and nasal sprays as listed above  Reflux Continue  dietary and lifestyle modifications as listed below Continue omeprazole daily to control reflux  Bacterial conjunctivitis Begin TobraDex 1 drop in each eye 3 times a day for 7 days then stop  Penicillin allergy Return to the clinic for penicillin skin testing when it is convenient for you.  Your blood pressure was elevated today.  Follow-up with your for evaluation and treatment of blood pressure  Call the clinic if this treatment plan is not working well for you  Follow up in 2 weeks or sooner if needed.     Return in about 2 weeks (around 02/28/2020), or if symptoms worsen or fail to improve.    Thank you for the opportunity to care for this  patient.  Please do not hesitate to contact me with questions.  Gareth Morgan, FNP [facility]

## 2020-02-14 NOTE — Telephone Encounter (Signed)
Pt. States sinus infection in worse, would like a call back.

## 2020-02-14 NOTE — Telephone Encounter (Signed)
JC will call pt. To have pt. Come in to see Webb Silversmith. Per Webb Silversmith. Ok to double book.

## 2020-02-14 NOTE — Telephone Encounter (Signed)
Thank you :)

## 2020-02-14 NOTE — Patient Instructions (Addendum)
Asthma Continue Dulera 200-2 puffs twice a day with a spacer to prevent cough or wheeze Continue Incruse Ellipta 1 puff once a day to prevent cough or wheeze Continue montelukast 10 mg once a day to prevent cough or wheeze Continue Flovent 110-2 puffs twice a day with a spacer for 2 more weeks Continue Nucala approval process Continue albuterol 2 puffs every 4 hours as needed for cough or wheeze OR Instead use albuterol 0.083% solution via nebulizer one unit vial every 4 hours as needed for cough or wheeze  Acute sinusitis Begin azithromycin.  Take 2 tablets on the first day and continue with 1 tablet. If not effective we will consider clindamycin Continue nasal saline rinses frequently Continue Nasacort 2 sprays in each nostril once a day  Allergic rhinitis  Continue antihistamine once a day as needed for runny nose. Remember to rotate to a different antihistamine about every 3 months. Some examples of over the counter antihistamines include Zyrtec (cetirizine), Xyzal (levocetirizine), Allegra (fexofenadine), and Claritin (loratidine).  Continue Flonase and nasal sprays as listed above  Reflux Continue dietary and lifestyle modifications as listed below Continue omeprazole daily to control reflux  Bacterial conjunctivitis Begin TobraDex 1 drop in each eye 3 times a day for 7 days then stop  Penicillin allergy Return to the clinic for penicillin skin testing when it is convenient for you.  Your blood pressure was elevated today.  Follow-up with your for evaluation and treatment of blood pressure  Call the clinic if this treatment plan is not working well for you  Follow up in 2 weeks or sooner if needed.

## 2020-02-16 ENCOUNTER — Encounter: Payer: Self-pay | Admitting: Family Medicine

## 2020-02-16 DIAGNOSIS — H109 Unspecified conjunctivitis: Secondary | ICD-10-CM | POA: Insufficient documentation

## 2020-02-16 DIAGNOSIS — K219 Gastro-esophageal reflux disease without esophagitis: Secondary | ICD-10-CM | POA: Insufficient documentation

## 2020-02-17 ENCOUNTER — Telehealth: Payer: Self-pay

## 2020-02-17 NOTE — Telephone Encounter (Signed)
Pt is feeling a lot better today

## 2020-02-17 NOTE — Telephone Encounter (Signed)
Ambs, Kathrine Cords, FNP  [facility] Can you please call to check on how this patient is feeling? Please ask about sinus pain, eyes, and breathing. Thank you     Lm for pt to call us back

## 2020-03-03 ENCOUNTER — Ambulatory Visit: Payer: Managed Care, Other (non HMO) | Attending: Internal Medicine

## 2020-03-03 ENCOUNTER — Other Ambulatory Visit (HOSPITAL_BASED_OUTPATIENT_CLINIC_OR_DEPARTMENT_OTHER): Payer: Self-pay | Admitting: Internal Medicine

## 2020-03-03 DIAGNOSIS — Z23 Encounter for immunization: Secondary | ICD-10-CM

## 2020-03-03 MED FILL — FLUAD QUADRIVALENT 0.5 ML P: 0.5 | 1 days supply | Qty: 1 | Fill #0

## 2020-03-06 MED FILL — PFIZER-BIONTECH COVID-19 VA: 30 | 1 days supply | Qty: 0 | Fill #0

## 2020-03-16 ENCOUNTER — Encounter: Payer: Self-pay | Admitting: Family

## 2020-03-16 MED ORDER — LEVOTHYROXINE SODIUM 75 MCG PO TABS
75.0000 ug | ORAL_TABLET | Freq: Every day | ORAL | 1 refills | Status: DC
Start: 2020-03-16 — End: 2020-08-24

## 2020-04-02 ENCOUNTER — Other Ambulatory Visit: Payer: Self-pay | Admitting: Family Medicine

## 2020-04-07 ENCOUNTER — Telehealth: Payer: Self-pay | Admitting: Family Medicine

## 2020-04-07 MED ORDER — DULERA 200-5 MCG/ACT IN AERO: 2.0000 | INHALATION_SPRAY | Freq: Two times a day (BID) | RESPIRATORY_TRACT | 3 refills | Status: DC

## 2020-04-07 NOTE — Telephone Encounter (Signed)
Dulera sent to [facility] x 1 with 3 refills. Patient informed.

## 2020-04-07 NOTE — Telephone Encounter (Signed)
Pt request refill for Ruthe Mannan was denied at the pharmacy. Last ov was 10/21.

## 2020-04-16 ENCOUNTER — Encounter: Payer: Self-pay | Admitting: *Deleted

## 2020-04-29 ENCOUNTER — Other Ambulatory Visit: Payer: Self-pay | Admitting: Family

## 2020-05-25 ENCOUNTER — Other Ambulatory Visit: Payer: Self-pay | Admitting: Allergy

## 2020-06-04 ENCOUNTER — Other Ambulatory Visit: Payer: Self-pay | Admitting: Allergy

## 2020-06-05 ENCOUNTER — Telehealth: Payer: Self-pay | Admitting: Family Medicine

## 2020-06-05 MED ORDER — INCRUSE ELLIPTA 62.5 MCG/INH IN AEPB: 1.0000 | INHALATION_SPRAY | Freq: Every day | RESPIRATORY_TRACT | 1 refills | Status: DC

## 2020-06-05 NOTE — Telephone Encounter (Signed)
Refill for Incruse given x 1 with 1 refill. Patient's 6 month check-up is due in April.

## 2020-06-05 NOTE — Telephone Encounter (Signed)
Pt request refill INCRUSE ELLIPTA  He has appt scheduled for 2/22.

## 2020-06-15 NOTE — Progress Notes (Signed)
[place] [id number] Dept: [phone]  FOLLOW UP NOTE  Patient ID: Michael [name], male    DOB: 09-09-1950  Age: 70 y.o. MRN: [id number] Date of Office Visit: 06/16/2020  Assessment  Chief Complaint: Asthma  HPI Michael [name] is a 70 year old male who presents to the clinic for follow-up visit.  He was last seen in this clinic on 02/14/2020 for evaluation of asthma with acute exacerbation, acute sinusitis, acute bacterial conjunctivitis, allergic rhinitis, reflux, history of nasal polyp, and food allergy to peanut, tree nuts, and oregano.  At today's visit he reports his asthma has been moderately well controlled with no shortness of breath or cough with activity or rest.  He does report occasional wheeze occurring mostly at nighttime.  He reports that about 2 times a month he has trouble " getting air out" for which he uses his albuterol inhaler with mild relief of symptoms.  When he uses albuterol or DuoNeb through the nebulizer he gets more relief of the symptoms.  He stopped using Dupixent in October due to anxiety that occurred for 3 to 4 days following Dupixent injection.  He reports anxiety has resolved allergic rhinitis is reported as much better than at the previous visit.  He reports occasional sneeze for which he continues Nasacort, Allegra, and saline nasal rinses.  He reports he did try Xyzol with no relief of symptoms.  Allergic conjunctivitis is reported as moderately well controlled with symptoms including red itchy eyes for which he is now taking Allegra and using olopatadine as needed.  He continues to avoid peanuts, tree nuts, and oregano with no accidental ingestion or EpiPen use since his last visit to this clinic.  His current medications are listed in the chart.   Drug Allergies:  Allergies  Allergen Reactions  . Baclofen Shortness Of Breath    Asthma exacerbation  . Colchicine Other (See Comments)    Pancreatitis  . Doxycycline  Hives  . Statins Other (See Comments)    Joint stiffness  . Avelox [Moxifloxacin Hcl In Nacl] Other (See Comments)    SEIZURE  . Bactrim [Sulfamethoxazole-Trimethoprim] Hives  . Cefdinir Hives  . Oregano [Origanum Oil] Cough  . Peanut-Containing Drug Products Swelling    Throat swelling  . Adhesive  [Tape] Other (See Comments)    "tears skin"  . Penicillins Rash    Physical Exam: BP (!) 144/92 (BP Location: Right Arm, Patient Position: Sitting, Cuff Size: Large)   Pulse 64   Temp 97.6 F (36.4 C) (Temporal)   Resp 16   Ht 5\' 6"  (1.676 m)   Wt 234 lb 6.4 oz (106.3 kg)   SpO2 98%   BMI 37.83 kg/m    Physical Exam Vitals reviewed.  Constitutional:      Appearance: Normal appearance.  HENT:     Head: Normocephalic and atraumatic.     Right Ear: Tympanic membrane normal.     Left Ear: Tympanic membrane normal.     Nose:     Comments: Bilateral nares slightly erythematous with clear nasal drainage noted.  Pharynx normal.  Ears normal.  Eyes normal.    Mouth/Throat:     Pharynx: Oropharynx is clear.  Eyes:     Conjunctiva/sclera: Conjunctivae normal.  Cardiovascular:     Rate and Rhythm: Normal rate and regular rhythm.     Heart sounds: Normal heart sounds. No murmur heard.   Pulmonary:     Effort: Pulmonary effort is normal.  Breath sounds: Normal breath sounds.     Comments: Lungs clear to auscultation Musculoskeletal:        General: Normal range of motion.     Cervical back: Normal range of motion and neck supple.  Skin:    General: Skin is warm and dry.  Neurological:     Mental Status: He is alert and oriented to person, place, and time.  Psychiatric:        Mood and Affect: Mood normal.        Behavior: Behavior normal.        Thought Content: Thought content normal.        Judgment: Judgment normal.     Diagnostics: FVC 2.98, FEV1 2.08.  Predicted FVC 3.87, predicted FEV1 2.85.  Spirometry indicates mild restriction.  Assessment and Plan: 1.  Severe persistent asthma, unspecified whether complicated   2. Other allergic rhinitis   3. Seasonal allergic conjunctivitis   4. History of nasal polyp   5. Gastroesophageal reflux disease, unspecified whether esophagitis present   6. Adverse food reaction, subsequent encounter     Meds ordered this encounter  Medications  . umeclidinium bromide (INCRUSE ELLIPTA) 62.5 MCG/INH AEPB    Sig: Inhale 1 puff into the lungs daily.    Dispense:  90 each    Refill:  1    Please dispense 90 supply  . mometasone-formoterol (DULERA) 200-5 MCG/ACT AERO    Sig: Inhale 2 puffs into the lungs 2 (two) times daily.    Dispense:  39 g    Refill:  1    Please dispense 90 days.    Patient Instructions  Asthma Continue Dulera 200-2 puffs twice a day with a spacer to prevent cough or wheeze Continue Incruse Ellipta 1 puff once a day to prevent cough or wheeze Continue montelukast 10 mg once a day to prevent cough or wheeze Continue albuterol 2 puffs every 4 hours as needed for cough or wheeze OR instead use albuterol 0.083% solution via nebulizer one unit vial every 4 hours as needed for cough or wheeze OR instead use DuoNeb via nebulizer once every 4 hours as needed for cough or wheeze  Allergic rhinitis  Continue Allegra once a day as needed for runny nose. Remember to rotate to a different antihistamine about every 3 months. Some examples of over the counter antihistamines include Zyrtec (cetirizine), Xyzal (levocetirizine), Allegra (fexofenadine), and Claritin (loratidine).  Continue Flonase and nasal sprays as listed above  Reflux Continue dietary and lifestyle modifications as listed below Continue omeprazole daily to control reflux  Penicillin allergy Return to the clinic for penicillin skin testing when it is convenient for you.  Food allergy Continue to avoid peanut, tree nut, oregano. In case of an allergic reaction, take Benadryl 50 mg every 4 hours, and if life-threatening symptoms  occur, inject with EpiPen 0.3 mg. Return to the clinic if you are interested in updating your food allergies.  Remember to stop antihistamines for 3 days before your allergy testing appointment.  History of nasal polyp Continue use of nasal steroid spray daily  Your blood pressure was elevated today.  Follow-up with your for evaluation and treatment of blood pressure  Call the clinic if this treatment plan is not working well for you  Follow up in 6 months or sooner if needed.   Return in about 6 months (around 12/14/2020), or if symptoms worsen or fail to improve.    Thank you for the opportunity to care for this patient.  Please do not hesitate to contact me with questions.  Gareth Morgan, FNP [facility]

## 2020-06-15 NOTE — Patient Instructions (Addendum)
Asthma Continue Dulera 200-2 puffs twice a day with a spacer to prevent cough or wheeze Continue Incruse Ellipta 1 puff once a day to prevent cough or wheeze Continue montelukast 10 mg once a day to prevent cough or wheeze Continue albuterol 2 puffs every 4 hours as needed for cough or wheeze OR instead use albuterol 0.083% solution via nebulizer one unit vial every 4 hours as needed for cough or wheeze OR instead use DuoNeb via nebulizer once every 4 hours as needed for cough or wheeze  Allergic rhinitis  Continue Allegra once a day as needed for runny nose. Remember to rotate to a different antihistamine about every 3 months. Some examples of over the counter antihistamines include Zyrtec (cetirizine), Xyzal (levocetirizine), Allegra (fexofenadine), and Claritin (loratidine).  Continue Flonase and nasal sprays as listed above  Reflux Continue dietary and lifestyle modifications as listed below Continue omeprazole daily to control reflux  Penicillin allergy Return to the clinic for penicillin skin testing when it is convenient for you.  Food allergy Continue to avoid peanut, tree nut, oregano. In case of an allergic reaction, take Benadryl 50 mg every 4 hours, and if life-threatening symptoms occur, inject with EpiPen 0.3 mg. Return to the clinic if you are interested in updating your food allergies.  Remember to stop antihistamines for 3 days before your allergy testing appointment.  History of nasal polyp Continue use of nasal steroid spray daily  Your blood pressure was elevated today.  Follow-up with your for evaluation and treatment of blood pressure  Call the clinic if this treatment plan is not working well for you  Follow up in 6 months or sooner if needed.

## 2020-06-16 ENCOUNTER — Other Ambulatory Visit: Payer: Self-pay

## 2020-06-16 ENCOUNTER — Encounter: Payer: Self-pay | Admitting: Family Medicine

## 2020-06-16 ENCOUNTER — Ambulatory Visit (INDEPENDENT_AMBULATORY_CARE_PROVIDER_SITE_OTHER): Payer: Managed Care, Other (non HMO) | Admitting: Family Medicine

## 2020-06-16 VITALS — BP 144/92 | HR 64 | Temp 97.6°F | Resp 16 | Ht 66.0 in | Wt 234.4 lb

## 2020-06-16 DIAGNOSIS — H1013 Acute atopic conjunctivitis, bilateral: Secondary | ICD-10-CM

## 2020-06-16 DIAGNOSIS — J3089 Other allergic rhinitis: Secondary | ICD-10-CM

## 2020-06-16 DIAGNOSIS — K219 Gastro-esophageal reflux disease without esophagitis: Secondary | ICD-10-CM

## 2020-06-16 DIAGNOSIS — T781XXD Other adverse food reactions, not elsewhere classified, subsequent encounter: Secondary | ICD-10-CM

## 2020-06-16 DIAGNOSIS — Z8709 Personal history of other diseases of the respiratory system: Secondary | ICD-10-CM

## 2020-06-16 DIAGNOSIS — J4550 Severe persistent asthma, uncomplicated: Secondary | ICD-10-CM | POA: Diagnosis not present

## 2020-06-16 DIAGNOSIS — H1010 Acute atopic conjunctivitis, unspecified eye: Secondary | ICD-10-CM

## 2020-06-16 MED ORDER — DULERA 200-5 MCG/ACT IN AERO: 2.0000 | INHALATION_SPRAY | Freq: Two times a day (BID) | RESPIRATORY_TRACT | 1 refills | Status: DC

## 2020-06-16 MED ORDER — INCRUSE ELLIPTA 62.5 MCG/INH IN AEPB
1.0000 | INHALATION_SPRAY | Freq: Every day | RESPIRATORY_TRACT | 1 refills | Status: DC
Start: 2020-06-16 — End: 2020-12-01

## 2020-06-19 ENCOUNTER — Ambulatory Visit: Payer: Managed Care, Other (non HMO) | Admitting: Family

## 2020-07-03 ENCOUNTER — Other Ambulatory Visit: Payer: Self-pay

## 2020-07-03 ENCOUNTER — Encounter: Payer: Self-pay | Admitting: Family

## 2020-07-03 ENCOUNTER — Ambulatory Visit (INDEPENDENT_AMBULATORY_CARE_PROVIDER_SITE_OTHER): Payer: Managed Care, Other (non HMO) | Admitting: Family

## 2020-07-03 VITALS — BP 156/82 | HR 61 | Temp 98.1°F | Resp 16 | Ht 66.0 in | Wt 231.0 lb

## 2020-07-03 DIAGNOSIS — C61 Malignant neoplasm of prostate: Secondary | ICD-10-CM

## 2020-07-03 DIAGNOSIS — Z23 Encounter for immunization: Secondary | ICD-10-CM

## 2020-07-03 DIAGNOSIS — J45909 Unspecified asthma, uncomplicated: Secondary | ICD-10-CM

## 2020-07-03 DIAGNOSIS — Z8546 Personal history of malignant neoplasm of prostate: Secondary | ICD-10-CM

## 2020-07-03 DIAGNOSIS — I10 Essential (primary) hypertension: Secondary | ICD-10-CM

## 2020-07-03 DIAGNOSIS — F419 Anxiety disorder, unspecified: Secondary | ICD-10-CM | POA: Diagnosis not present

## 2020-07-03 LAB — PSA: PSA: 0 ng/mL — ABNORMAL LOW (ref 0.10–4.00)

## 2020-07-03 LAB — BASIC METABOLIC PANEL
BUN: 18 mg/dL (ref 6–23)
CO2: 29 mEq/L (ref 19–32)
Calcium: 9.2 mg/dL (ref 8.4–10.5)
Chloride: 105 mEq/L (ref 96–112)
Creatinine, Ser: 1.27 mg/dL (ref 0.40–1.50)
GFR: 58.2 mL/min — ABNORMAL LOW (ref >60.00)
Glucose, Bld: 96 mg/dL (ref 70–99)
Potassium: 4.6 mEq/L (ref 3.5–5.1)
Sodium: 141 mEq/L (ref 135–145)

## 2020-07-03 MED ORDER — LOSARTAN POTASSIUM 100 MG PO TABS: 100.0000 mg | ORAL_TABLET | Freq: Every day | ORAL | 1 refills | Status: DC

## 2020-07-03 NOTE — Addendum Note (Signed)
Addended by: Jiles Prows on: 07/03/2020 08:02 AM   Modules accepted: Orders

## 2020-07-03 NOTE — Patient Instructions (Signed)
Please call [facility] to schedule an appointment with a counselor:  [phone] Complete lab work prior to leaving.

## 2020-07-03 NOTE — Progress Notes (Signed)
Subjective:    Patient ID: Michael [name], male    DOB: 03-17-1951, 70 y.o.   MRN: [id number]  HPI  HTN- he is maintained on losartan 50mg .  He has been walking more to manage stress.  Reports that he has had a restless sleep the last few weeks.   BP Readings from Last 3 Encounters:  07/03/20 (!) 156/82  06/16/20 (!) 144/92  02/14/20 (!) 150/86   Asthma- following with pulmonary.         Review of Systems See HPI  Past Medical History:  Diagnosis Date  . Allergy   . Arthritis   . Asthma   . Cancer (HCC)    basal cell and squamous cell carcinoma  . Cancer [facility])    prostate  . Clotting disorder ([place])   . Colon polyps   . GERD (gastroesophageal reflux disease)   . Gout   . High cholesterol   . History of hepatitis    due to drug interaction?  . History of pancreatitis 2016   ?due to colchicine  . History of pulmonary embolus (PE) 2016  . History of stomach ulcers   . Hypertension   . Hypothyroidism   . OSA (obstructive sleep apnea) 2015   has CPAP  . Pneumonia   . Sleep apnea   . Thyroid disease    hypothyroid  . Urinary incontinence      Social History   Socioeconomic History  . Marital status: Married    Spouse name: Not on file  . Number of children: Not on file  . Years of education: Not on file  . Highest education level: Not on file  Occupational History  . Not on file  Tobacco Use  . Smoking status: Never Smoker  . Smokeless tobacco: Never Used  Vaping Use  . Vaping Use: Never used  Substance and Sexual Activity  . Alcohol use: Yes    Alcohol/week: 1.0 standard drink    Types: 1 Glasses of wine per week  . Drug use: No  . Sexual activity: Not on file  Other Topics Concern  . Not on file  Social History Narrative   Former Lawyer at [employer], now Optician, dispensing.    Married   3 daughters- all live locally   Has 7 grandchildren and 1 great grandchild   Enjoys reading, Geophysicist/field seismologist   Social  Determinants of Radio broadcast assistant Strain: Not on file  Food Insecurity: Not on file  Transportation Needs: Not on file  Physical Activity: Not on file  Stress: Not on file  Social Connections: Not on file  Intimate Partner Violence: Not on file    Past Surgical History:  Procedure Laterality Date  . BASAL CELL CARCINOMA EXCISION  2017   MOHS.  beneath right eye  . CHOLECYSTECTOMY  2001  . COLONOSCOPY     multiple - last 06/2017  . HIP SURGERY    . PENILE PROSTHESIS IMPLANT  2016  . PROSTATECTOMY  2009  . SINUS SURGERY WITH INSTATRAK  2014  . SQUAMOUS CELL CARCINOMA EXCISION  01/2017   nose  . TOOTH EXTRACTION    . TOTAL HIP ARTHROPLASTY  06/2016    Family History  Problem Relation Age of Onset  . Asthma Mother   . Arthritis Mother   . Skin cancer Mother        basal cell carcinoma  . Asthma Father   . Arthritis Father   . Diabetes Father   .  Heart attack Father   . Hyperlipidemia Father   . Hypertension Father   . Kidney disease Father   . Diabetes Brother   . Breast cancer Maternal Grandmother   . Diabetes Paternal Grandfather   . Arthritis Brother   . Hyperlipidemia Brother   . Alcohol abuse Brother   . Colon cancer Neg Hx   . Esophageal cancer Neg Hx   . Liver cancer Neg Hx   . Pancreatic cancer Neg Hx   . Rectal cancer Neg Hx   . Stomach cancer Neg Hx     Allergies  Allergen Reactions  . Baclofen Shortness Of Breath    Asthma exacerbation  . Colchicine Other (See Comments)    Pancreatitis  . Doxycycline Hives  . Statins Other (See Comments)    Joint stiffness  . Avelox [Moxifloxacin Hcl In Nacl] Other (See Comments)    SEIZURE  . Bactrim [Sulfamethoxazole-Trimethoprim] Hives  . Cefdinir Hives  . Oregano [Origanum Oil] Cough  . Peanut-Containing Drug Products Swelling    Throat swelling  . Adhesive  [Tape] Other (See Comments)    "tears skin"  . Penicillins Rash    Current Outpatient Medications on File Prior to Visit   Medication Sig Dispense Refill  . albuterol (VENTOLIN HFA) 108 (90 Base) MCG/ACT inhaler INHALE 2 PUFFS INTO THE LUNGS EVERY 6 HOURS AS NEEDED FOR WHEEZING OR SHORTNESS OF BREATH 8.5 g 0  . allopurinol (ZYLOPRIM) 100 MG tablet Take 1 tablet (100 mg total) by mouth 2 (two) times daily. 180 tablet 3  . calcium carbonate (OS-CAL - DOSED IN MG OF ELEMENTAL CALCIUM) 1250 (500 Ca) MG tablet Take 1 tablet by mouth daily.    . Cholecalciferol (VITAMIN D3) 5000 units CAPS Take by mouth.    . clotrimazole-betamethasone (LOTRISONE) cream Apply 1 application topically 2 (two) times daily. 30 g 0  . docusate sodium (COLACE) 100 MG capsule Take 200 mg by mouth daily.     Marland Kitchen EPINEPHrine 0.3 mg/0.3 mL IJ SOAJ injection Use as directed for severe allergic reactions 4 each 3  . fexofenadine (ALLEGRA) 180 MG tablet Take 180 mg by mouth daily.     Marland Kitchen ipratropium-albuterol (DUONEB) 0.5-2.5 (3) MG/3ML SOLN 1 vial in nebulizer every 4-6 hours while awake for the next 3-5 days for coughing or wheezing. 360 mL 1  . levothyroxine (SYNTHROID) 75 MCG tablet Take 1 tablet (75 mcg total) by mouth daily before breakfast. 90 tablet 1  . Magnesium 250 MG TABS Take by mouth.    . mometasone-formoterol (DULERA) 200-5 MCG/ACT AERO Inhale 2 puffs into the lungs 2 (two) times daily. 39 g 1  . montelukast (SINGULAIR) 10 MG tablet TAKE 1 TABLET(10 MG) BY MOUTH AT BEDTIME 30 tablet 5  . tiZANidine (ZANAFLEX) 2 MG tablet Take 1-2 tablets (2-4 mg total) by mouth at bedtime as needed for muscle spasms. 60 tablet 1  . umeclidinium bromide (INCRUSE ELLIPTA) 62.5 MCG/INH AEPB Inhale 1 puff into the lungs daily. 90 each 1   Current Facility-Administered Medications on File Prior to Visit  Medication Dose Route Frequency Provider Last Rate Last Admin  . 0.9 %  sodium chloride infusion  500 mL Intravenous Once Gatha Mayer, MD      . Benralizumab SOSY 30 mg  30 mg Subcutaneous Q8 Weeks Bobbitt, [place] Muta, MD   30 mg at 07/12/19 1546     BP (!) 156/82 (BP Location: Right Arm, Patient Position: Sitting, Cuff Size: Small)   Pulse 61  Temp 98.1 F (36.7 C) (Oral)   Resp 16   Ht 5\' 6"  (1.676 m)   Wt 231 lb (104.8 kg)   SpO2 99%   BMI 37.28 kg/m       Objective:   Physical Exam Constitutional:      General: He is not in acute distress.    Appearance: He is well-developed.  HENT:     Head: Normocephalic and atraumatic.  Cardiovascular:     Rate and Rhythm: Normal rate and regular rhythm.     Heart sounds: No murmur heard.   Pulmonary:     Effort: Pulmonary effort is normal. No respiratory distress.     Breath sounds: Examination of the right-middle field reveals decreased breath sounds. Examination of the left-middle field reveals decreased breath sounds. Examination of the right-lower field reveals decreased breath sounds. Examination of the left-lower field reveals decreased breath sounds. Decreased breath sounds present. No wheezing or rales.  Skin:    General: Skin is warm and dry.  Neurological:     Mental Status: He is alert and oriented to person, place, and time.  Psychiatric:        Behavior: Behavior normal.        Thought Content: Thought content normal.           Assessment & Plan:  HTN-  bp above goal. Will increase losartan from 50mg  to 100mg  once daily.  Check bmet.  Hx of Prostate Cancer- follows with Dr. Rosana Hoes.  Requesting PSA today.  Anxiety- pt states that he is having a lot of situational anxiety surrounding the loss of his pregnant granddaughter, aging mother, and brother who is suffering from brain cancer. I suggested that he establish with a counselor as this would likely be helpful for him.   Asthma- he stopped Berna Bue last year due to cost. He continues to work with his allergist and pulmonologist on med management.   High dose flu shot today.  This visit occurred during the SARS-CoV-2 public health emergency.  Safety protocols were in place, including screening questions  prior to the visit, additional usage of staff PPE, and extensive cleaning of exam room while observing appropriate contact time as indicated for disinfecting solutions.

## 2020-07-13 ENCOUNTER — Encounter: Payer: Self-pay | Admitting: Family

## 2020-07-13 ENCOUNTER — Other Ambulatory Visit: Payer: Self-pay

## 2020-07-13 ENCOUNTER — Ambulatory Visit (INDEPENDENT_AMBULATORY_CARE_PROVIDER_SITE_OTHER): Payer: Managed Care, Other (non HMO) | Admitting: Family

## 2020-07-13 VITALS — BP 155/74 | HR 46 | Temp 98.1°F | Resp 16 | Ht 66.0 in | Wt 231.0 lb

## 2020-07-13 DIAGNOSIS — I10 Essential (primary) hypertension: Secondary | ICD-10-CM | POA: Diagnosis not present

## 2020-07-13 DIAGNOSIS — R21 Rash and other nonspecific skin eruption: Secondary | ICD-10-CM

## 2020-07-13 DIAGNOSIS — R001 Bradycardia, unspecified: Secondary | ICD-10-CM | POA: Diagnosis not present

## 2020-07-13 MED ORDER — AMLODIPINE BESYLATE 5 MG PO TABS: 5.0000 mg | ORAL_TABLET | Freq: Every day | ORAL | 3 refills | Status: DC

## 2020-07-13 NOTE — Patient Instructions (Signed)
Stop losartan. Start amlodipine 5mg  once daily.

## 2020-07-13 NOTE — Progress Notes (Signed)
Subjective:    Patient ID: Michael [name], male    DOB: 10-06-1950, 70 y.o.   MRN: [id number]  HPI  Patient presents today for follow up of his hypertension. Last visit we increased his losartan from 50mg  to 100mg  once daily.  He reports a rash between his fingers and around the base of his neck 1 hr after dosing.    BP Readings from Last 3 Encounters:  07/13/20 (!) 155/74  07/03/20 (!) 156/82  06/16/20 (!) 144/92   Reports that his HR drops into the high 40's at night.  Denies dizziness. Feels a little light headed sometimes in the evening 1 hr after taking meds.   He notes a lot of stress recently. In addition to losing his pregnant granddaughter in an MVA last year, his mother recently developed AF/Renal failure and is in the ICU.  His brother who is near her in [place] has terminal brain cancer.    Review of Systems See HPI    Past Medical History:  Diagnosis Date  . Allergy   . Arthritis   . Asthma   . Cancer (HCC)    basal cell and squamous cell carcinoma  . Cancer [facility])    prostate  . Clotting disorder ([place])   . Colon polyps   . GERD (gastroesophageal reflux disease)   . Gout   . High cholesterol   . History of hepatitis    due to drug interaction?  . History of pancreatitis 2016   ?due to colchicine  . History of pulmonary embolus (PE) 2016  . History of stomach ulcers   . Hypertension   . Hypothyroidism   . OSA (obstructive sleep apnea) 2015   has CPAP  . Pneumonia   . Sleep apnea   . Thyroid disease    hypothyroid  . Urinary incontinence      Social History   Socioeconomic History  . Marital status: Married    Spouse name: Not on file  . Number of children: Not on file  . Years of education: Not on file  . Highest education level: Not on file  Occupational History  . Not on file  Tobacco Use  . Smoking status: Never Smoker  . Smokeless tobacco: Never Used  Vaping Use  . Vaping Use: Never used  Substance and Sexual Activity  .  Alcohol use: Yes    Alcohol/week: 1.0 standard drink    Types: 1 Glasses of wine per week  . Drug use: No  . Sexual activity: Not on file  Other Topics Concern  . Not on file  Social History Narrative   Former Lawyer at [employer], now Optician, dispensing.    Married   3 daughters- all live locally   Has 7 grandchildren and 1 great grandchild   Enjoys reading, Geophysicist/field seismologist   Social Determinants of Radio broadcast assistant Strain: Not on file  Food Insecurity: Not on file  Transportation Needs: Not on file  Physical Activity: Not on file  Stress: Not on file  Social Connections: Not on file  Intimate Partner Violence: Not on file    Past Surgical History:  Procedure Laterality Date  . BASAL CELL CARCINOMA EXCISION  2017   MOHS.  beneath right eye  . CHOLECYSTECTOMY  2001  . COLONOSCOPY     multiple - last 06/2017  . HIP SURGERY    . PENILE PROSTHESIS IMPLANT  2016  . PROSTATECTOMY  2009  . SINUS SURGERY WITH  INSTATRAK  2014  . SQUAMOUS CELL CARCINOMA EXCISION  01/2017   nose  . TOOTH EXTRACTION    . TOTAL HIP ARTHROPLASTY  06/2016    Family History  Problem Relation Age of Onset  . Asthma Mother   . Arthritis Mother   . Skin cancer Mother        basal cell carcinoma  . Asthma Father   . Arthritis Father   . Diabetes Father   . Heart attack Father   . Hyperlipidemia Father   . Hypertension Father   . Kidney disease Father   . Diabetes Brother   . Breast cancer Maternal Grandmother   . Diabetes Paternal Grandfather   . Arthritis Brother   . Hyperlipidemia Brother   . Alcohol abuse Brother   . Colon cancer Neg Hx   . Esophageal cancer Neg Hx   . Liver cancer Neg Hx   . Pancreatic cancer Neg Hx   . Rectal cancer Neg Hx   . Stomach cancer Neg Hx     Allergies  Allergen Reactions  . Baclofen Shortness Of Breath    Asthma exacerbation  . Colchicine Other (See Comments)    Pancreatitis  . Doxycycline Hives  . Statins Other (See  Comments)    Joint stiffness  . Avelox [Moxifloxacin Hcl In Nacl] Other (See Comments)    SEIZURE  . Bactrim [Sulfamethoxazole-Trimethoprim] Hives  . Cefdinir Hives  . Losartan     Rash   . Oregano [Origanum Oil] Cough  . Peanut-Containing Drug Products Swelling    Throat swelling  . Adhesive  [Tape] Other (See Comments)    "tears skin"  . Penicillins Rash    Current Outpatient Medications on File Prior to Visit  Medication Sig Dispense Refill  . albuterol (VENTOLIN HFA) 108 (90 Base) MCG/ACT inhaler INHALE 2 PUFFS INTO THE LUNGS EVERY 6 HOURS AS NEEDED FOR WHEEZING OR SHORTNESS OF BREATH 8.5 g 0  . allopurinol (ZYLOPRIM) 100 MG tablet Take 1 tablet (100 mg total) by mouth 2 (two) times daily. 180 tablet 3  . calcium carbonate (OS-CAL - DOSED IN MG OF ELEMENTAL CALCIUM) 1250 (500 Ca) MG tablet Take 1 tablet by mouth daily.    . Cholecalciferol (VITAMIN D3) 5000 units CAPS Take by mouth.    . clotrimazole-betamethasone (LOTRISONE) cream Apply 1 application topically 2 (two) times daily. 30 g 0  . docusate sodium (COLACE) 100 MG capsule Take 200 mg by mouth daily.     Marland Kitchen EPINEPHrine 0.3 mg/0.3 mL IJ SOAJ injection Use as directed for severe allergic reactions 4 each 3  . fexofenadine (ALLEGRA) 180 MG tablet Take 180 mg by mouth daily.     Marland Kitchen ipratropium-albuterol (DUONEB) 0.5-2.5 (3) MG/3ML SOLN 1 vial in nebulizer every 4-6 hours while awake for the next 3-5 days for coughing or wheezing. 360 mL 1  . levothyroxine (SYNTHROID) 75 MCG tablet Take 1 tablet (75 mcg total) by mouth daily before breakfast. 90 tablet 1  . losartan (COZAAR) 100 MG tablet Take 1 tablet (100 mg total) by mouth daily. 90 tablet 1  . Magnesium 250 MG TABS Take by mouth.    . mometasone-formoterol (DULERA) 200-5 MCG/ACT AERO Inhale 2 puffs into the lungs 2 (two) times daily. 39 g 1  . montelukast (SINGULAIR) 10 MG tablet TAKE 1 TABLET(10 MG) BY MOUTH AT BEDTIME 30 tablet 5  . tiZANidine (ZANAFLEX) 2 MG tablet Take  1-2 tablets (2-4 mg total) by mouth at bedtime as needed for muscle  spasms. 60 tablet 1  . umeclidinium bromide (INCRUSE ELLIPTA) 62.5 MCG/INH AEPB Inhale 1 puff into the lungs daily. 90 each 1   Current Facility-Administered Medications on File Prior to Visit  Medication Dose Route Frequency Provider Last Rate Last Admin  . 0.9 %  sodium chloride infusion  500 mL Intravenous Once Gatha Mayer, MD      . Benralizumab SOSY 30 mg  30 mg Subcutaneous Q8 Weeks Bobbitt, [place] Muta, MD   30 mg at 07/12/19 1546    BP (!) 155/74 (BP Location: Right Arm, Patient Position: Sitting, Cuff Size: Large)   Pulse (!) 46   Temp 98.1 F (36.7 C) (Oral)   Resp 16   Ht 5\' 6"  (1.676 m)   Wt 231 lb (104.8 kg)   SpO2 99%   BMI 37.28 kg/m    Objective:   Physical Exam Constitutional:      General: He is not in acute distress.    Appearance: He is well-developed.  HENT:     Head: Normocephalic and atraumatic.  Cardiovascular:     Rate and Rhythm: Regular rhythm. Bradycardia present.     Heart sounds: No murmur heard.   Pulmonary:     Effort: Pulmonary effort is normal. No respiratory distress.     Breath sounds: Normal breath sounds. No wheezing or rales.  Skin:    General: Skin is warm and dry.     Comments: Some rash noted between fingers bilaterally  Neurological:     Mental Status: He is alert and oriented to person, place, and time.  Psychiatric:        Behavior: Behavior normal.        Thought Content: Thought content normal.           Assessment & Plan:  Skin Rash- pt relates this to losartan. Will d/c losartan.  HTN- uncontrolled. D/c losartan as above. Add amlodipine 5mg  once daily.  Bradycardia- He saw cardiology in 2020 for a virtual visit. Did not have EKG performed at that time. EKG is performed today and tracing is personally reviewed.  EKG notes NSR (sinus brady with HR 53).  No acute changes.   This visit occurred during the SARS-CoV-2 public health emergency.   Safety protocols were in place, including screening questions prior to the visit, additional usage of staff PPE, and extensive cleaning of exam room while observing appropriate contact time as indicated for disinfecting solutions.

## 2020-07-20 ENCOUNTER — Encounter: Payer: Self-pay | Admitting: Family

## 2020-07-21 NOTE — Telephone Encounter (Signed)
Please advise 

## 2020-08-01 ENCOUNTER — Other Ambulatory Visit: Payer: Self-pay | Admitting: Allergy

## 2020-08-11 ENCOUNTER — Ambulatory Visit (INDEPENDENT_AMBULATORY_CARE_PROVIDER_SITE_OTHER): Payer: Managed Care, Other (non HMO) | Admitting: Family

## 2020-08-11 ENCOUNTER — Other Ambulatory Visit: Payer: Self-pay

## 2020-08-11 VITALS — BP 148/88 | HR 56 | Temp 98.2°F | Resp 16 | Wt 233.4 lb

## 2020-08-11 DIAGNOSIS — I10 Essential (primary) hypertension: Secondary | ICD-10-CM | POA: Diagnosis not present

## 2020-08-11 NOTE — Progress Notes (Signed)
Subjective:    Patient ID: Michael [name], male    DOB: June 03, 1950, 70 y.o.   MRN: [id number]  HPI  Patient is a 70 yr old male who presents today for follow up.   HTN- last visit he reported rash with losartan.  We added amlodipine 5mg  once daily last visit. He states that he is feeling much better and sleeping much better since starting amlodipine.  BP Readings from Last 3 Encounters:  08/11/20 (!) 148/88  07/13/20 (!) 155/74  07/03/20 (!) 156/82     Review of Systems See HPI  Past Medical History:  Diagnosis Date  . Allergy   . Arthritis   . Asthma   . Cancer (HCC)    basal cell and squamous cell carcinoma  . Cancer [facility])    prostate  . Clotting disorder ([place])   . Colon polyps   . GERD (gastroesophageal reflux disease)   . Gout   . High cholesterol   . History of hepatitis    due to drug interaction?  . History of pancreatitis 2016   ?due to colchicine  . History of pulmonary embolus (PE) 2016  . History of stomach ulcers   . Hypertension   . Hypothyroidism   . OSA (obstructive sleep apnea) 2015   has CPAP  . Pneumonia   . Sleep apnea   . Thyroid disease    hypothyroid  . Urinary incontinence      Social History   Socioeconomic History  . Marital status: Married    Spouse name: Not on file  . Number of children: Not on file  . Years of education: Not on file  . Highest education level: Not on file  Occupational History  . Not on file  Tobacco Use  . Smoking status: Never Smoker  . Smokeless tobacco: Never Used  Vaping Use  . Vaping Use: Never used  Substance and Sexual Activity  . Alcohol use: Yes    Alcohol/week: 1.0 standard drink    Types: 1 Glasses of wine per week  . Drug use: No  . Sexual activity: Not on file  Other Topics Concern  . Not on file  Social History Narrative   Former Lawyer at [employer], now Optician, dispensing.    Married   3 daughters- all live locally   Has 7 grandchildren and 1 great  grandchild   Enjoys reading, Geophysicist/field seismologist   Social Determinants of Radio broadcast assistant Strain: Not on file  Food Insecurity: Not on file  Transportation Needs: Not on file  Physical Activity: Not on file  Stress: Not on file  Social Connections: Not on file  Intimate Partner Violence: Not on file    Past Surgical History:  Procedure Laterality Date  . BASAL CELL CARCINOMA EXCISION  2017   MOHS.  beneath right eye  . CHOLECYSTECTOMY  2001  . COLONOSCOPY     multiple - last 06/2017  . HIP SURGERY    . PENILE PROSTHESIS IMPLANT  2016  . PROSTATECTOMY  2009  . SINUS SURGERY WITH INSTATRAK  2014  . SQUAMOUS CELL CARCINOMA EXCISION  01/2017   nose  . TOOTH EXTRACTION    . TOTAL HIP ARTHROPLASTY  06/2016    Family History  Problem Relation Age of Onset  . Asthma Mother   . Arthritis Mother   . Skin cancer Mother        basal cell carcinoma  . Asthma Father   . Arthritis  Father   . Diabetes Father   . Heart attack Father   . Hyperlipidemia Father   . Hypertension Father   . Kidney disease Father   . Diabetes Brother   . Breast cancer Maternal Grandmother   . Diabetes Paternal Grandfather   . Arthritis Brother   . Hyperlipidemia Brother   . Alcohol abuse Brother   . Colon cancer Neg Hx   . Esophageal cancer Neg Hx   . Liver cancer Neg Hx   . Pancreatic cancer Neg Hx   . Rectal cancer Neg Hx   . Stomach cancer Neg Hx     Allergies  Allergen Reactions  . Baclofen Shortness Of Breath    Asthma exacerbation  . Colchicine Other (See Comments)    Pancreatitis  . Doxycycline Hives  . Statins Other (See Comments)    Joint stiffness  . Avelox [Moxifloxacin Hcl In Nacl] Other (See Comments)    SEIZURE  . Bactrim [Sulfamethoxazole-Trimethoprim] Hives  . Cefdinir Hives  . Losartan     Rash   . Oregano [Origanum Oil] Cough  . Peanut-Containing Drug Products Swelling    Throat swelling  . Adhesive  [Tape] Other (See Comments)    "tears skin"  .  Penicillins Rash    Current Outpatient Medications on File Prior to Visit  Medication Sig Dispense Refill  . albuterol (VENTOLIN HFA) 108 (90 Base) MCG/ACT inhaler INHALE 2 PUFFS INTO THE LUNGS EVERY 6 HOURS AS NEEDED FOR WHEEZING OR SHORTNESS OF BREATH 8.5 g 0  . allopurinol (ZYLOPRIM) 100 MG tablet Take 1 tablet (100 mg total) by mouth 2 (two) times daily. 180 tablet 3  . amLODipine (NORVASC) 5 MG tablet Take 1 tablet (5 mg total) by mouth daily. 30 tablet 3  . calcium carbonate (OS-CAL - DOSED IN MG OF ELEMENTAL CALCIUM) 1250 (500 Ca) MG tablet Take 1 tablet by mouth daily.    . Cholecalciferol (VITAMIN D3) 5000 units CAPS Take by mouth.    . clotrimazole-betamethasone (LOTRISONE) cream Apply 1 application topically 2 (two) times daily. 30 g 0  . COVID-19 mRNA vaccine, Pfizer, 30 MCG/0.3ML injection INJECT AS DIRECTED .3 mL 0  . docusate sodium (COLACE) 100 MG capsule Take 200 mg by mouth daily.     Marland Kitchen EPINEPHrine 0.3 mg/0.3 mL IJ SOAJ injection Use as directed for severe allergic reactions 4 each 3  . fexofenadine (ALLEGRA) 180 MG tablet Take 180 mg by mouth daily.     . influenza vaccine adjuvanted (FLUAD) 0.5 ML injection INJECT AS DIRECTED .5 mL 0  . ipratropium-albuterol (DUONEB) 0.5-2.5 (3) MG/3ML SOLN 1 vial in nebulizer every 4-6 hours while awake for the next 3-5 days for coughing or wheezing. 360 mL 1  . levothyroxine (SYNTHROID) 75 MCG tablet Take 1 tablet (75 mcg total) by mouth daily before breakfast. 90 tablet 1  . Magnesium 250 MG TABS Take by mouth.    . mometasone-formoterol (DULERA) 200-5 MCG/ACT AERO Inhale 2 puffs into the lungs 2 (two) times daily. 39 g 1  . montelukast (SINGULAIR) 10 MG tablet TAKE 1 TABLET(10 MG) BY MOUTH AT BEDTIME 30 tablet 5  . tiZANidine (ZANAFLEX) 2 MG tablet Take 1-2 tablets (2-4 mg total) by mouth at bedtime as needed for muscle spasms. 60 tablet 1  . umeclidinium bromide (INCRUSE ELLIPTA) 62.5 MCG/INH AEPB Inhale 1 puff into the lungs daily. 90  each 1   Current Facility-Administered Medications on File Prior to Visit  Medication Dose Route Frequency Provider Last Rate  Last Admin  . 0.9 %  sodium chloride infusion  500 mL Intravenous Once Gatha Mayer, MD      . Benralizumab SOSY 30 mg  30 mg Subcutaneous Q8 Weeks Bobbitt, [place] Muta, MD   30 mg at 07/12/19 1546    BP (!) 148/88 (BP Location: Right Arm, Patient Position: Sitting, Cuff Size: Large)   Pulse (!) 56   Temp 98.2 F (36.8 C) (Oral)   Resp 16   Wt 233 lb 6.4 oz (105.9 kg)   SpO2 99%   BMI 37.67 kg/m       Objective:   Physical Exam Constitutional:      General: He is not in acute distress.    Appearance: He is well-developed.  HENT:     Head: Normocephalic and atraumatic.  Cardiovascular:     Rate and Rhythm: Normal rate and regular rhythm.     Heart sounds: No murmur heard.   Pulmonary:     Effort: Pulmonary effort is normal. No respiratory distress.     Breath sounds: Normal breath sounds. No wheezing or rales.  Skin:    General: Skin is warm and dry.  Neurological:     Mental Status: He is alert and oriented to person, place, and time.  Psychiatric:        Behavior: Behavior normal.        Thought Content: Thought content normal.           Assessment & Plan:  HTN- bp improved. Continue amlodipine 5mg  once daily.  This visit occurred during the SARS-CoV-2 public health emergency.  Safety protocols were in place, including screening questions prior to the visit, additional usage of staff PPE, and extensive cleaning of exam room while observing appropriate contact time as indicated for disinfecting solutions.

## 2020-08-23 ENCOUNTER — Other Ambulatory Visit: Payer: Self-pay | Admitting: Family

## 2020-10-09 ENCOUNTER — Ambulatory Visit: Payer: Managed Care, Other (non HMO) | Attending: Internal Medicine

## 2020-10-09 DIAGNOSIS — Z23 Encounter for immunization: Secondary | ICD-10-CM

## 2020-10-09 NOTE — Progress Notes (Signed)
   Covid-19 Vaccination Clinic  Name:  Michael [name]    MRN: [id number] DOB: February 22, 1951  10/09/2020  [name] was observed post Covid-19 immunization for 30 minutes based on pre-vaccination screening without incident. He was provided with Vaccine Information Sheet and instruction to access the V-Safe system.   [name] was instructed to call 911 with any severe reactions post vaccine: Difficulty breathing  Swelling of face and throat  A fast heartbeat  A bad rash all over body  Dizziness and weakness   Immunizations Administered     Name Date Dose VIS Date Route   PFIZER Comrnaty(Gray TOP) Covid-19 Vaccine 10/09/2020  9:17 AM 0.3 mL 04/02/2020 Intramuscular   Manufacturer: [employer]   Lot: [id number]   [place]: [phone]

## 2020-10-12 ENCOUNTER — Other Ambulatory Visit (HOSPITAL_BASED_OUTPATIENT_CLINIC_OR_DEPARTMENT_OTHER): Payer: Self-pay

## 2020-10-12 MED ORDER — COVID-19 MRNA VAC-TRIS(PFIZER) 30 MCG/0.3ML IM SUSP
INTRAMUSCULAR | 0 refills | Status: DC
  Filled 2020-10-12: qty 0.3, 1d supply, fill #0

## 2020-10-21 ENCOUNTER — Ambulatory Visit (INDEPENDENT_AMBULATORY_CARE_PROVIDER_SITE_OTHER): Payer: Managed Care, Other (non HMO) | Admitting: Family

## 2020-10-21 ENCOUNTER — Telehealth: Payer: Self-pay | Admitting: Family

## 2020-10-21 ENCOUNTER — Ambulatory Visit (HOSPITAL_BASED_OUTPATIENT_CLINIC_OR_DEPARTMENT_OTHER)
Admission: RE | Admit: 2020-10-21 | Discharge: 2020-10-21 | Disposition: A | Discharge status: Home / Self Care | Payer: Managed Care, Other (non HMO) | Source: Ambulatory Visit | Attending: Family | Admitting: Family

## 2020-10-21 ENCOUNTER — Encounter: Payer: Self-pay | Admitting: Neurology

## 2020-10-21 ENCOUNTER — Other Ambulatory Visit: Payer: Self-pay

## 2020-10-21 VITALS — BP 149/81 | HR 53 | Temp 97.9°F | Resp 16 | Wt 234.0 lb

## 2020-10-21 DIAGNOSIS — R251 Tremor, unspecified: Secondary | ICD-10-CM | POA: Insufficient documentation

## 2020-10-21 DIAGNOSIS — R059 Cough, unspecified: Secondary | ICD-10-CM | POA: Diagnosis present

## 2020-10-21 DIAGNOSIS — J019 Acute sinusitis, unspecified: Secondary | ICD-10-CM

## 2020-10-21 DIAGNOSIS — R911 Solitary pulmonary nodule: Secondary | ICD-10-CM

## 2020-10-21 DIAGNOSIS — J4551 Severe persistent asthma with (acute) exacerbation: Secondary | ICD-10-CM

## 2020-10-21 MED ORDER — AZITHROMYCIN 250 MG PO TABS: ORAL_TABLET | ORAL | 0 refills | Status: AC

## 2020-10-21 MED ORDER — PREDNISONE 10 MG PO TABS: ORAL_TABLET | ORAL | 0 refills | Status: DC

## 2020-10-21 NOTE — Assessment & Plan Note (Addendum)
8 mm noted R apex on CXR today. Will order CT for further evaluation.

## 2020-10-21 NOTE — Assessment & Plan Note (Signed)
?   atx versus consolidation bilateral lung bases.  Does not clinically appear to have PNA. Obtain CT chest, begin zpak and prednisone as ordered.

## 2020-10-21 NOTE — Assessment & Plan Note (Signed)
New. Will refer to neurology.

## 2020-10-21 NOTE — Assessment & Plan Note (Signed)
abx choice is limited by multiple drug allergies. Will rx with azithromycin 500mg  x 1 day, then 250mg  daily x 4 more days.

## 2020-10-21 NOTE — Patient Instructions (Signed)
Please begin Azithromycin (antibiotic) and Prednisone taper. Complete chest x-ray on the first floor. Please call if symptoms worsen or if not improved in 1 week.

## 2020-10-21 NOTE — Assessment & Plan Note (Signed)
Uncontrolled. Will rx with prednisone taper 40mg  x 3 days, 30 x 3 days, 20 x 3 days, 10 x 3 days. Continue albuterol prn and Dulera 200.

## 2020-10-21 NOTE — Telephone Encounter (Signed)
Reviewed CXR results with pt and need to proceed with chest CT. Pt verbalizes understanding and is agreeable to proceed.

## 2020-10-21 NOTE — Progress Notes (Signed)
Subjective:     Patient ID: Michael [name], male    DOB: 1950-06-08, 70 y.o.   MRN: [id number]  Chief Complaint  Patient presents with   Facial Pain    Complains of sinus pain    HPI Patient is in today for sinusitis.  Reports that he has c/o pain and pressure mostly behind the left eye and now some behind the right eye. Nasal drainage is clotted green color, running down the back of his throat. He denies associated fever. Has been rinsing his nose with nasal saline but symptoms are improving.  He is concerned that it is now affecting his lungs.    He also notes a fine tremor in his right hand. Notes that this has been present x 3 months. Worse later in the day when he is more tired.   There are no preventive care reminders to display for this patient.  Past Medical History:  Diagnosis Date   Allergy    Arthritis    Asthma    Cancer ([place])    basal cell and squamous cell carcinoma   Cancer ([facility])    prostate   Clotting disorder (HCC)    Colon polyps    GERD (gastroesophageal reflux disease)    Gout    High cholesterol    History of hepatitis    due to drug interaction?   History of pancreatitis 2016   ?due to colchicine   History of pulmonary embolus (PE) 2016   History of stomach ulcers    Hypertension    Hypothyroidism    OSA (obstructive sleep apnea) 2015   has CPAP   Pneumonia    Sleep apnea    Thyroid disease    hypothyroid   Urinary incontinence     Past Surgical History:  Procedure Laterality Date   BASAL CELL CARCINOMA EXCISION  2017   MOHS.  beneath right eye   CHOLECYSTECTOMY  2001   COLONOSCOPY     multiple - last 06/2017   HIP SURGERY     PENILE PROSTHESIS IMPLANT  2016   PROSTATECTOMY  2009   SINUS SURGERY WITH INSTATRAK  2014   SQUAMOUS CELL CARCINOMA EXCISION  01/2017   nose   TOOTH EXTRACTION     TOTAL HIP ARTHROPLASTY  06/2016    Family History  Problem Relation Age of Onset   Asthma Mother    Arthritis Mother    Skin  cancer Mother        basal cell carcinoma   Asthma Father    Arthritis Father    Diabetes Father    Heart attack Father    Hyperlipidemia Father    Hypertension Father    Kidney disease Father    Diabetes Brother    Breast cancer Maternal Grandmother    Diabetes Paternal Grandfather    Arthritis Brother    Hyperlipidemia Brother    Alcohol abuse Brother    Colon cancer Neg Hx    Esophageal cancer Neg Hx    Liver cancer Neg Hx    Pancreatic cancer Neg Hx    Rectal cancer Neg Hx    Stomach cancer Neg Hx     Social History   Socioeconomic History   Marital status: Married    Spouse name: Not on file   Number of children: Not on file   Years of education: Not on file   Highest education level: Not on file  Occupational History   Not on file  Tobacco  Use   Smoking status: Never   Smokeless tobacco: Never  Vaping Use   Vaping Use: Never used  Substance and Sexual Activity   Alcohol use: Yes    Alcohol/week: 1.0 standard drink    Types: 1 Glasses of wine per week   Drug use: No   Sexual activity: Not on file  Other Topics Concern   Not on file  Social History Narrative   Former Lawyer at [employer], now Optician, dispensing.    Married   3 daughters- all live locally   Has 7 grandchildren and 1 great grandchild   Enjoys reading, Geophysicist/field seismologist   Social Determinants of Radio broadcast assistant Strain: Not on file  Food Insecurity: Not on file  Transportation Needs: Not on file  Physical Activity: Not on file  Stress: Not on file  Social Connections: Not on file  Intimate Partner Violence: Not on file    Outpatient Medications Prior to Visit  Medication Sig Dispense Refill   albuterol (VENTOLIN HFA) 108 (90 Base) MCG/ACT inhaler INHALE 2 PUFFS INTO THE LUNGS EVERY 6 HOURS AS NEEDED FOR WHEEZING OR SHORTNESS OF BREATH 8.5 g 0   allopurinol (ZYLOPRIM) 100 MG tablet Take 1 tablet (100 mg total) by mouth 2 (two) times daily. 180 tablet 3    amLODipine (NORVASC) 5 MG tablet Take 1 tablet (5 mg total) by mouth daily. 30 tablet 3   calcium carbonate (OS-CAL - DOSED IN MG OF ELEMENTAL CALCIUM) 1250 (500 Ca) MG tablet Take 1 tablet by mouth daily.     Cholecalciferol (VITAMIN D3) 5000 units CAPS Take by mouth.     clotrimazole-betamethasone (LOTRISONE) cream Apply 1 application topically 2 (two) times daily. 30 g 0   COVID-19 mRNA Vac-TriS, Pfizer, SUSP injection Inject into the muscle. 0.3 mL 0   COVID-19 mRNA vaccine, Pfizer, 30 MCG/0.3ML injection INJECT AS DIRECTED .3 mL 0   docusate sodium (COLACE) 100 MG capsule Take 200 mg by mouth daily.      EPINEPHrine 0.3 mg/0.3 mL IJ SOAJ injection Use as directed for severe allergic reactions 4 each 3   fexofenadine (ALLEGRA) 180 MG tablet Take 180 mg by mouth daily.      influenza vaccine adjuvanted (FLUAD) 0.5 ML injection INJECT AS DIRECTED .5 mL 0   ipratropium-albuterol (DUONEB) 0.5-2.5 (3) MG/3ML SOLN 1 vial in nebulizer every 4-6 hours while awake for the next 3-5 days for coughing or wheezing. 360 mL 1   levothyroxine (SYNTHROID) 75 MCG tablet TAKE 1 TABLET(75 MCG) BY MOUTH DAILY BEFORE AND BREAKFAST 90 tablet 1   Magnesium 250 MG TABS Take by mouth.     mometasone-formoterol (DULERA) 200-5 MCG/ACT AERO Inhale 2 puffs into the lungs 2 (two) times daily. 39 g 1   montelukast (SINGULAIR) 10 MG tablet TAKE 1 TABLET(10 MG) BY MOUTH AT BEDTIME 30 tablet 5   tiZANidine (ZANAFLEX) 2 MG tablet Take 1-2 tablets (2-4 mg total) by mouth at bedtime as needed for muscle spasms. 60 tablet 1   umeclidinium bromide (INCRUSE ELLIPTA) 62.5 MCG/INH AEPB Inhale 1 puff into the lungs daily. 90 each 1   Facility-Administered Medications Prior to Visit  Medication Dose Route Frequency Provider Last Rate Last Admin   0.9 %  sodium chloride infusion  500 mL Intravenous Once Gatha Mayer, MD       Benralizumab SOSY 30 mg  30 mg Subcutaneous Q8 Weeks Bobbitt, [place] Muta, MD   30 mg at 07/12/19 1546  Allergies  Allergen Reactions   Baclofen Shortness Of Breath    Asthma exacerbation   Colchicine Other (See Comments)    Pancreatitis   Doxycycline Hives   Statins Other (See Comments)    Joint stiffness   Avelox [Moxifloxacin Hcl In Nacl] Other (See Comments)    SEIZURE   Bactrim [Sulfamethoxazole-Trimethoprim] Hives   Cefdinir Hives   Losartan     Rash    Oregano [Origanum Oil] Cough   Peanut-Containing Drug Products Swelling    Throat swelling   Adhesive  [Tape] Other (See Comments)    "tears skin"   Penicillins Rash    ROS See HPI    Objective:    Physical Exam Constitutional:      General: He is not in acute distress.    Appearance: He is well-developed.  HENT:     Head: Normocephalic and atraumatic.     Right Ear: Tympanic membrane normal.     Left Ear: Tympanic membrane normal.     Nose:     Right Sinus: Maxillary sinus tenderness and frontal sinus tenderness present.     Left Sinus: Maxillary sinus tenderness and frontal sinus tenderness present.  Cardiovascular:     Rate and Rhythm: Normal rate and regular rhythm.     Heart sounds: No murmur heard. Pulmonary:     Effort: Pulmonary effort is normal. No respiratory distress.     Breath sounds: Decreased air movement present. Decreased breath sounds present. No wheezing, rhonchi or rales.  Skin:    General: Skin is warm and dry.  Neurological:     Mental Status: He is alert and oriented to person, place, and time.     Comments: Fine resting tremor noted right hand  Psychiatric:        Behavior: Behavior normal.        Thought Content: Thought content normal.    BP (!) 149/81 (BP Location: Left Arm, Patient Position: Sitting, Cuff Size: Large)   Pulse (!) 53   Temp 97.9 F (36.6 C) (Oral)   Resp 16   Wt 234 lb (106.1 kg)   SpO2 99%   BMI 37.77 kg/m  Wt Readings from Last 3 Encounters:  10/21/20 234 lb (106.1 kg)  08/11/20 233 lb 6.4 oz (105.9 kg)  07/13/20 231 lb (104.8 kg)        Assessment & Plan:   Problem List Items Addressed This Visit       Unprioritized   Tremor of right hand    New. Will refer to neurology.        Relevant Orders   Ambulatory referral to Neurology   Severe persistent asthma    Uncontrolled. Will rx with prednisone taper 40mg  x 3 days, 30 x 3 days, 20 x 3 days, 10 x 3 days. Continue albuterol prn and Dulera 200.         Relevant Medications   predniSONE (DELTASONE) 10 MG tablet   Nodule of right lung    8 mm noted R apex on CXR today. Will order CT for further evaluation.        Cough - Primary    ? atx versus consolidation bilateral lung bases.  Does not clinically appear to have PNA. Obtain CT chest, begin zpak and prednisone as ordered.        Relevant Orders   DG Chest 2 View (Completed)   Acute sinusitis    abx choice is limited by multiple drug allergies. Will rx with azithromycin 500mg   x 1 day, then 250mg  daily x 4 more days.        Relevant Medications   predniSONE (DELTASONE) 10 MG tablet   azithromycin (ZITHROMAX) 250 MG tablet    I am having Michael Memos. Freilich "Hassell Done" start on predniSONE and azithromycin. I am also having him maintain his Vitamin D3, docusate sodium, fexofenadine, ipratropium-albuterol, Magnesium, calcium carbonate, EPINEPHrine, albuterol, allopurinol, clotrimazole-betamethasone, tiZANidine, Incruse Ellipta, Dulera, amLODipine, COVID-19 mRNA vaccine ([place]), influenza vaccine adjuvanted, montelukast, levothyroxine, and COVID-19 mRNA Vac-TriS [employer]). We will continue to administer sodium chloride and Benralizumab.  Meds ordered this encounter  Medications   predniSONE (DELTASONE) 10 MG tablet    Sig: Take 4 tabs by mouth once daily for 3 days, then 3 tabs daily x 3 days, then 2 tabs once daily x 3 days then 1 tab daily x 3 days then stop    Dispense:  30 tablet    Refill:  0    Order Specific Question:   Supervising Provider    Answer:   Penni Homans A [[id number]]   azithromycin  (ZITHROMAX) 250 MG tablet    Sig: Take 2 tablets on day 1, then 1 tablet daily on days 2 through 5    Dispense:  6 tablet    Refill:  0    Order Specific Question:   Supervising Provider    Answer:   Penni Homans A [[id number]]

## 2020-10-27 ENCOUNTER — Ambulatory Visit (INDEPENDENT_AMBULATORY_CARE_PROVIDER_SITE_OTHER): Payer: Managed Care, Other (non HMO)

## 2020-10-27 ENCOUNTER — Other Ambulatory Visit: Payer: Self-pay

## 2020-10-27 DIAGNOSIS — R911 Solitary pulmonary nodule: Secondary | ICD-10-CM | POA: Diagnosis not present

## 2020-10-29 ENCOUNTER — Other Ambulatory Visit: Payer: Self-pay | Admitting: Family

## 2020-11-11 NOTE — Progress Notes (Signed)
Assessment/Plan:    Tremor -Did not see a lot of tremor today, and wonder if it is not induced by his medicines for his lungs (on Singulair, Dulera and albuterol as needed).  He wondered about essential tremor.  Certainly, that is within the differential and we discussed this today.  However, technically, essential tremor cannot be diagnosed in the face of one being on tremor inducing medications.  We discussed various medicines.  We discussed that I would really not want him on a beta-blocker (partially because of his asthma and partially because of bradycardia).  He actually reported that he had been on gabapentin in the past, which helped his tremor.  This is certainly a consideration, as it is one of the second line medications for tremor.  After some discussion, we decided to hold on medication for tremor, as it just was not been important to him to start medication.  We certainly can address this in the future if tremor becomes more bothersome.  We can also consider primidone.  2.  Dysphasia  -Did not notice this on examination, but this was something that he complained about (hesitancy when speaking and word finding).  He really did not complain about memory change, as we had discussed neurocognitive testing, but ultimately decided that that would not be helpful.  We did decide to go ahead and proceed with MRI of the brain.  His neurologic exam was nonfocal and nonlateralizing today.   Subjective:   Michael [name] was seen today in the movement disorders clinic for neurologic consultation at the request of Debbrah Alar, NP.  The consultation is for the evaluation of "fine tremor of R hand."  Outside records that were made available to me were reviewed.    Tremor: Yes.     How long has it been going on? 3 years - started in the R hand back then and now in the L.  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  Activation, esp with  precise motion  Fam hx of tremor?  No.  Located where?  R>L (he is R hand dominant).  No tremor in the R hand  Affected by caffeine:  doesn't drink much coffee  Affected by alcohol:  Yes.   (1 glass wine per day)  Affected by stress:  Yes.    Affected by fatigue:  Yes.  , much worse  Spills soup if on spoon:  Yes.    Spills glass of liquid if full:  No.  Affects ADL's (tying shoes, brushing teeth, etc):  No. (Still shaves with blade)  Tremor inducing meds:  Yes.  , prednisone (just off of it), singulair, dulera, albuterol (uses 1 time q 3 days)  Other Specific Symptoms:  Voice: some softer and speech is hesitant Sleep: trouble staying asleep (attributes to neck/shoulder/back pain) - on cpap  Vivid Dreams:  Yes.    Acting out dreams:  No. Wet Pillows: No. Postural symptoms:  Yes.  , attributes to "age"  Falls?  No., none in last 3 years Bradykinesia symptoms: no bradykinesia noted Loss of smell:  No. Loss of taste:  No. Urinary Incontinence:  No. Difficulty Swallowing:  No. Handwriting, micrographia: No., its legible but "looks hurried" N/V:  No. Lightheaded:  Yes.  , occasionally - attributes to BP meds  Syncope: No. Diplopia:  No. Dyskinesia:  No.  Neuroimaging of the brain has not previously been performed.  Had CT face/sinus at  [facility] in 2015.  Did have head injury at age of 51 on swing set - no LOC.  Multiple concussions as child  PREVIOUS MEDICATIONS: none to date  ALLERGIES:   Allergies  Allergen Reactions   Baclofen Shortness Of Breath    Asthma exacerbation   Colchicine Other (See Comments)    Pancreatitis   Doxycycline Hives   Statins Other (See Comments)    Joint stiffness   Avelox [Moxifloxacin Hcl In Nacl] Other (See Comments)    SEIZURE   Bactrim [Sulfamethoxazole-Trimethoprim] Hives   Cefdinir Hives   Losartan     Rash    Oregano [Origanum Oil] Cough   Peanut-Containing Drug Products Swelling    Throat swelling   Adhesive  [Tape] Other (See  Comments)    "tears skin"   Penicillins Rash    CURRENT MEDICATIONS:  Current Outpatient Medications  Medication Instructions   albuterol (VENTOLIN HFA) 108 (90 Base) MCG/ACT inhaler INHALE 2 PUFFS INTO THE LUNGS EVERY 6 HOURS AS NEEDED FOR WHEEZING OR SHORTNESS OF BREATH   allopurinol (ZYLOPRIM) 100 mg, Oral, 2 times daily   amLODipine (NORVASC) 5 MG tablet TAKE 1 TABLET(5 MG) BY MOUTH DAILY   calcium carbonate (OS-CAL - DOSED IN MG OF ELEMENTAL CALCIUM) 1250 (500 Ca) MG tablet 1 tablet, Oral, Daily   Cholecalciferol (VITAMIN D3) 5000 units CAPS Oral   clotrimazole-betamethasone (LOTRISONE) cream 1 application, Topical, 2 times daily   COVID-19 mRNA Vac-TriS, Pfizer, SUSP injection Intramuscular   COVID-19 mRNA vaccine, Pfizer, 30 MCG/0.3ML injection INJECT AS DIRECTED   docusate sodium (COLACE) 200 mg, Oral, Daily   EPINEPHrine 0.3 mg/0.3 mL IJ SOAJ injection Use as directed for severe allergic reactions   fexofenadine (ALLEGRA) 180 mg, Oral, Daily   influenza vaccine adjuvanted (FLUAD) 0.5 ML injection INJECT AS DIRECTED   ipratropium-albuterol (DUONEB) 0.5-2.5 (3) MG/3ML SOLN 1 vial in nebulizer every 4-6 hours while awake for the next 3-5 days for coughing or wheezing.   levothyroxine (SYNTHROID) 75 MCG tablet TAKE 1 TABLET(75 MCG) BY MOUTH DAILY BEFORE AND BREAKFAST   Magnesium 250 MG TABS Oral   mometasone-formoterol (DULERA) 200-5 MCG/ACT AERO 2 puffs, Inhalation, 2 times daily   montelukast (SINGULAIR) 10 MG tablet TAKE 1 TABLET(10 MG) BY MOUTH AT BEDTIME   predniSONE (DELTASONE) 10 MG tablet Take 4 tabs by mouth once daily for 3 days, then 3 tabs daily x 3 days, then 2 tabs once daily x 3 days then 1 tab daily x 3 days then stop   tiZANidine (ZANAFLEX) 2-4 mg, Oral, At bedtime PRN   umeclidinium bromide (INCRUSE ELLIPTA) 62.5 MCG/INH AEPB 1 puff, Inhalation, Daily    Objective:   PHYSICAL EXAMINATION:    VITALS:   Vitals:   11/16/20 0857  BP: 106/72  Pulse: (!) 55   SpO2: 99%  Weight: 228 lb (103.4 kg)  Height: 5\' 6"  (1.676 m)    GEN:  The patient appears stated age and is in NAD. HEENT:  Normocephalic, atraumatic.  The mucous membranes are moist. The superficial temporal arteries are without ropiness or tenderness. CV:  brady.  regular Lungs:  CTAB Neck/HEME:  There are no carotid bruits bilaterally.  Neurological examination:  Orientation: The patient is alert and oriented x3.  Cranial nerves: There is good facial symmetry.  Extraocular muscles are intact. The visual fields are full to confrontational testing. The speech is fluent and clear. Soft palate rises symmetrically and there is no tongue deviation. Hearing is intact to conversational tone. Sensation: Sensation  is intact to light touch throughout (facial, trunk, extremities). Vibration is intact at the bilateral big toe, although slightly decreased. There is no extinction with double simultaneous stimulation.  Motor: Strength is 5/5 in the bilateral upper and lower extremities.   Shoulder shrug is equal and symmetric.  There is no pronator drift. Deep tendon reflexes: Deep tendon reflexes are 0-1/4 at the bilateral biceps, triceps, brachioradialis, patella and achilles. Plantar responses are downgoing bilaterally.  Movement examination: Tone: There is normal tone in the bilateral upper extremities.  The tone in the lower extremities is normal.  Abnormal movements: No rest tremor, even with distraction procedures.  No significant postural or intention tremor.  No significant evidence of tremor with Archimedes spirals.  Does not spill water with attempts at pouring it from 1 glass to another. Coordination:  There is no decremation with RAM's, with any form of RAMS, including alternating supination and pronation of the forearm, hand opening and closing, finger taps, heel taps and toe taps. Gait and Station: The patient has no difficulty arising out of a deep-seated chair without the use of the  hands. The patient's stride length is good.   I have reviewed and interpreted the following labs independently   Chemistry      Component Value Date/Time   NA 141 07/03/2020 0754   K 4.6 07/03/2020 0754   CL 105 07/03/2020 0754   CO2 29 07/03/2020 0754   BUN 18 07/03/2020 0754   CREATININE 1.27 07/03/2020 0754   CREATININE 1.11 12/18/2019 0820      Component Value Date/Time   CALCIUM 9.2 07/03/2020 0754   ALKPHOS 63 06/09/2018 2136   AST 15 12/18/2019 0820   ALT 26 12/18/2019 0820   BILITOT 0.5 12/18/2019 0820      Lab Results  Component Value Date   TSH 3.60 12/18/2019   Lab Results  Component Value Date   WBC 6.8 12/18/2019   HGB 16.1 12/18/2019   HCT 46.7 12/18/2019   MCV 94.2 12/18/2019   PLT 263 12/18/2019      Total time spent on today's visit was 72minutes, including both face-to-face time and nonface-to-face time.  Time included that spent on review of records (prior notes available to me/labs/imaging if pertinent), discussing treatment and goals, answering patient's questions and coordinating care.  Cc:  Debbrah Alar, NP

## 2020-11-16 ENCOUNTER — Other Ambulatory Visit: Payer: Self-pay

## 2020-11-16 ENCOUNTER — Encounter: Payer: Self-pay | Admitting: Neurology

## 2020-11-16 ENCOUNTER — Ambulatory Visit (INDEPENDENT_AMBULATORY_CARE_PROVIDER_SITE_OTHER): Payer: Managed Care, Other (non HMO) | Admitting: Neurology

## 2020-11-16 VITALS — BP 106/72 | HR 55 | Ht 66.0 in | Wt 228.0 lb

## 2020-11-16 DIAGNOSIS — G251 Drug-induced tremor: Secondary | ICD-10-CM | POA: Diagnosis not present

## 2020-11-16 DIAGNOSIS — J45909 Unspecified asthma, uncomplicated: Secondary | ICD-10-CM | POA: Diagnosis not present

## 2020-11-16 DIAGNOSIS — R4702 Dysphasia: Secondary | ICD-10-CM

## 2020-11-16 NOTE — Patient Instructions (Signed)
A referral to [facility] has been placed for your MRI someone will contact you directly to schedule your appt. They are located at [address] Please contact them directly by calling [phone] with any questions regarding your referral.  

## 2020-11-22 IMAGING — DX DG THORACIC SPINE 2V
3 series · 3 of 3 positions shown · non-contrast
Comparison: Chest x-ray 05/04/2018

CLINICAL DATA: Thoracic back pain.

EXAM:
THORACIC SPINE 2 VIEWS

[t-spine ap]
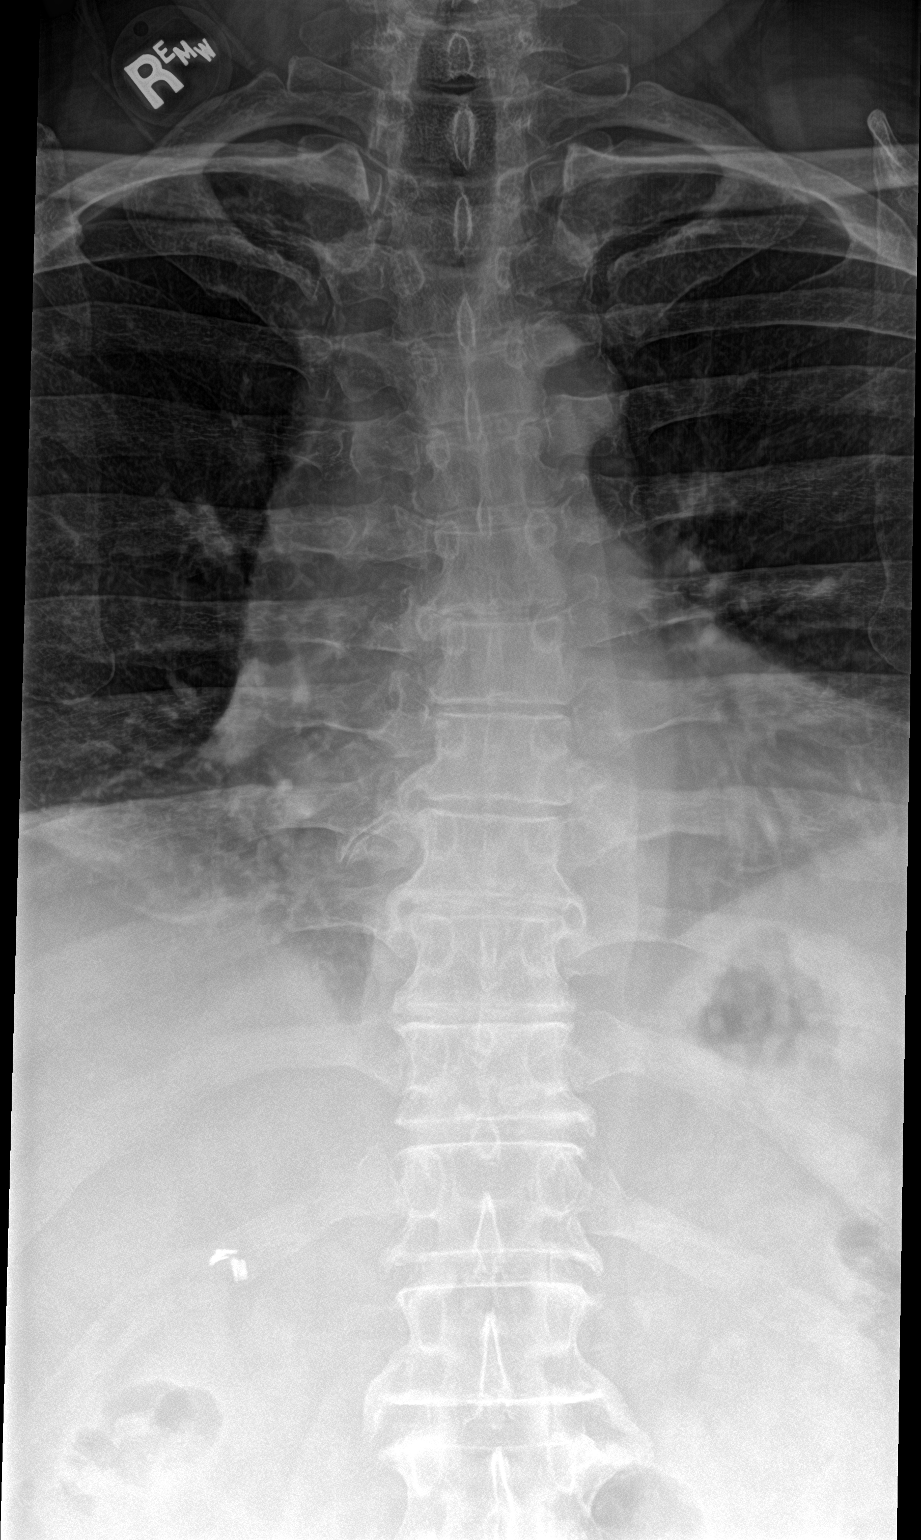

[t-spine lat]
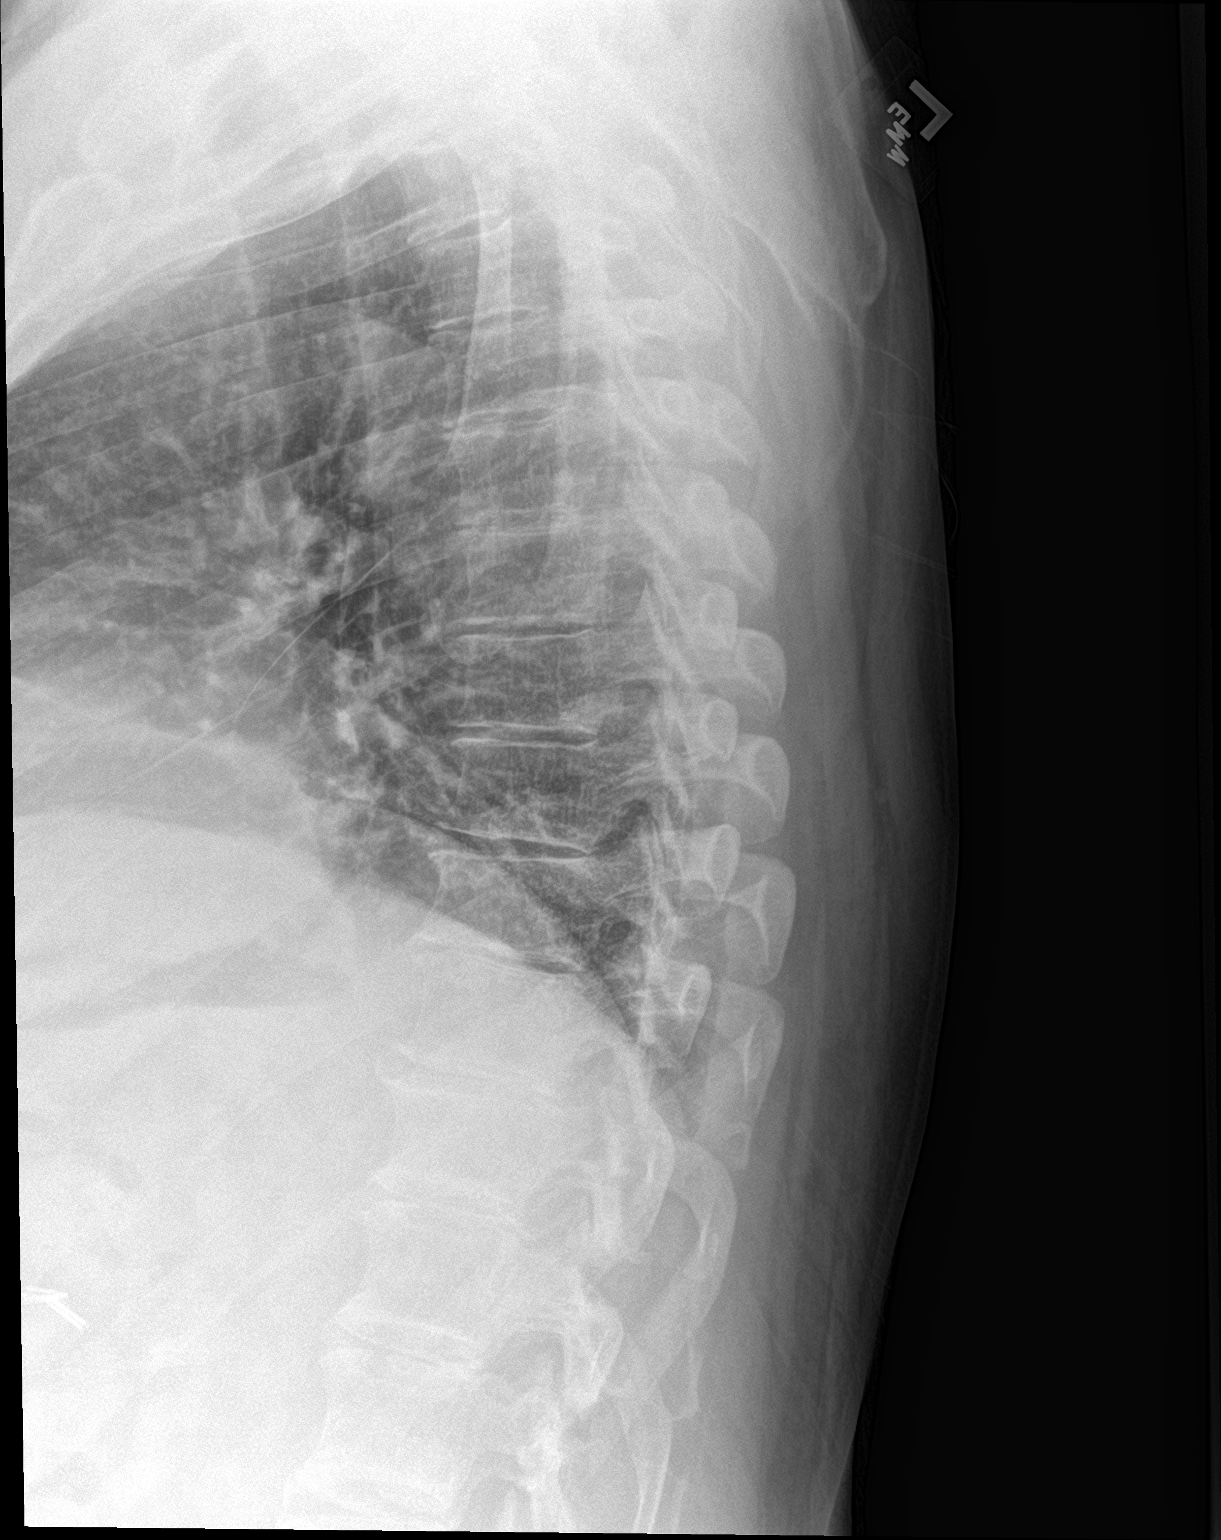

[t-spine swimmers]
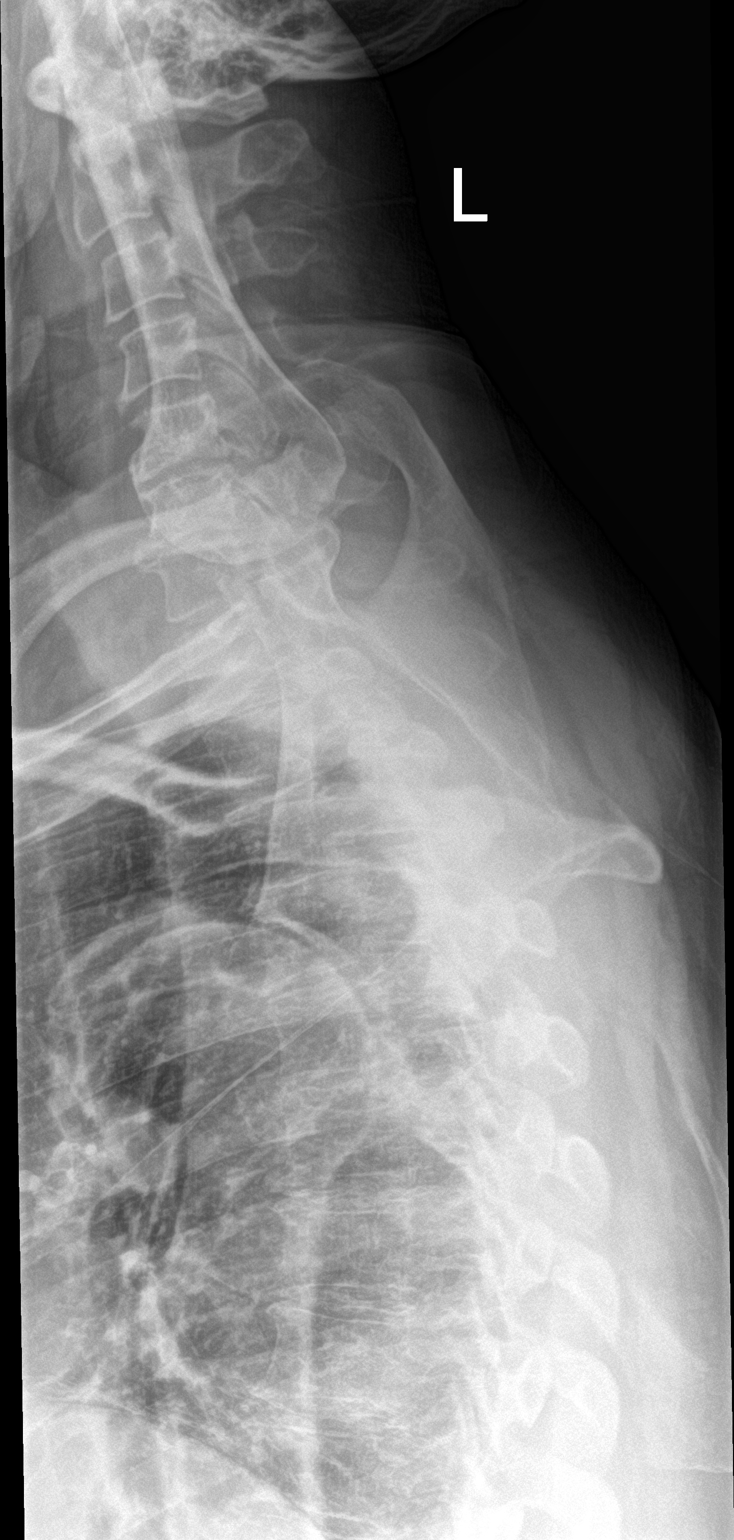

[3 of 3 positions shown; findings below may reference images not displayed]

FINDINGS: Mild levo convexity in the thoracic spine.

Spinal degenerative changes also noted incidentally in the lower
cervical spine. No fracture or static subluxation.
IMPRESSION: 1. Mild levo convexity and spinal degenerative change. No sign of
acute fracture or malalignment.

## 2020-11-27 ENCOUNTER — Other Ambulatory Visit: Payer: Self-pay | Admitting: Family Medicine

## 2020-12-03 ENCOUNTER — Other Ambulatory Visit: Payer: Self-pay | Admitting: Family Medicine

## 2020-12-03 ENCOUNTER — Other Ambulatory Visit: Payer: Self-pay | Admitting: Family

## 2020-12-03 DIAGNOSIS — M109 Gout, unspecified: Secondary | ICD-10-CM

## 2020-12-05 ENCOUNTER — Other Ambulatory Visit: Payer: Self-pay

## 2020-12-05 ENCOUNTER — Ambulatory Visit
Admission: RE | Admit: 2020-12-05 | Discharge: 2020-12-05 | Disposition: A | Discharge status: Home / Self Care | Payer: Managed Care, Other (non HMO) | Source: Ambulatory Visit | Attending: Neurology | Admitting: Neurology

## 2020-12-05 ENCOUNTER — Other Ambulatory Visit: Payer: Managed Care, Other (non HMO)

## 2020-12-05 DIAGNOSIS — R4702 Dysphasia: Secondary | ICD-10-CM

## 2021-01-12 ENCOUNTER — Other Ambulatory Visit: Payer: Self-pay

## 2021-01-12 ENCOUNTER — Ambulatory Visit (INDEPENDENT_AMBULATORY_CARE_PROVIDER_SITE_OTHER): Payer: Managed Care, Other (non HMO) | Admitting: Family

## 2021-01-12 VITALS — BP 134/83 | HR 51 | Temp 97.8°F | Resp 16 | Wt 214.0 lb

## 2021-01-12 DIAGNOSIS — J0100 Acute maxillary sinusitis, unspecified: Secondary | ICD-10-CM

## 2021-01-12 DIAGNOSIS — Z23 Encounter for immunization: Secondary | ICD-10-CM | POA: Diagnosis not present

## 2021-01-12 MED ORDER — AZITHROMYCIN 250 MG PO TABS: ORAL_TABLET | ORAL | 0 refills | Status: AC

## 2021-01-12 NOTE — Patient Instructions (Signed)
Please start azithromycin for sinus infection.  Call if symptoms worsen or if symptoms fail to improve.

## 2021-01-12 NOTE — Progress Notes (Signed)
Subjective:   By signing my name below, I, Lyric Barr-McArthur, attest that this documentation has been prepared under the direction and in the presence of Debbrah Alar, NP, 01/12/2021   Patient ID: Michael [name], male    DOB: 1951/01/12, 70 y.o.   MRN: [id number]  Chief Complaint  Patient presents with  . Sinus Problem    Complains of sinus pain, pressure and discharge.     HPI Patient is in today for an office visit.  Sinus Infection: He complains of a sinus infection that began a month ago. He notes that he flew on a plane with the sinus infection and after this complains of pain in his teeth. He has been using OTC nasal rinses to help with his symptoms. He also uses Flonase and allegra to help with sinus issues and allergies.  He has swelling in the left cheek.  Bug bite: He notes a bug bite near his waist band area. He was worried that it was a tick after visiting his brothers ranch in [place] but has seen improvements in the big bite.   Asthma: He denies any worsening asthma symptoms.  Vaccination: He is interested in receiving his flu shot today in the office as well as the updated Covid-19 booster.   There are no preventive care reminders to display for this patient.   Past Medical History:  Diagnosis Date  . Allergy   . Arthritis   . Asthma   . Cancer (HCC)    basal cell and squamous cell carcinoma  . Cancer [facility])    prostate  . Clotting disorder ([place])   . Colon polyps   . GERD (gastroesophageal reflux disease)   . Gout   . High cholesterol   . History of hepatitis    due to drug interaction?  . History of pancreatitis 2016   ?due to colchicine  . History of pulmonary embolus (PE) 2016  . History of stomach ulcers   . Hypertension   . Hypothyroidism   . OSA (obstructive sleep apnea) 2015   has CPAP  . Pneumonia   . Sleep apnea   . Thyroid disease    hypothyroid  . Urinary incontinence     Past Surgical History:  Procedure Laterality  Date  . BASAL CELL CARCINOMA EXCISION  2017   MOHS.  beneath right eye  . CHOLECYSTECTOMY  2001  . COLONOSCOPY     multiple - last 06/2017  . HIP SURGERY    . PENILE PROSTHESIS IMPLANT  2016  . PROSTATECTOMY  2009  . SINUS SURGERY WITH INSTATRAK  2014  . SQUAMOUS CELL CARCINOMA EXCISION  01/2017   nose  . TOOTH EXTRACTION    . TOTAL HIP ARTHROPLASTY  06/2016    Family History  Problem Relation Age of Onset  . Asthma Mother   . Arthritis Mother   . Skin cancer Mother        basal cell carcinoma  . Kidney failure Mother   . Atrial fibrillation Mother   . Asthma Father   . Arthritis Father   . Diabetes Father   . Heart attack Father   . Hyperlipidemia Father   . Hypertension Father   . Kidney disease Father   . Diabetes Brother   . Arthritis Brother   . Hyperlipidemia Brother   . Brain cancer Brother        [facility]  . Alcohol abuse Brother   . Breast cancer Maternal Grandmother   . Diabetes Paternal  Grandfather   . Colon cancer Neg Hx   . Esophageal cancer Neg Hx   . Liver cancer Neg Hx   . Pancreatic cancer Neg Hx   . Rectal cancer Neg Hx   . Stomach cancer Neg Hx     Social History   Socioeconomic History  . Marital status: Married    Spouse name: Not on file  . Number of children: 3  . Years of education: Not on file  . Highest education level: Not on file  Occupational History  . Not on file  Tobacco Use  . Smoking status: Never  . Smokeless tobacco: Never  Vaping Use  . Vaping Use: Never used  Substance and Sexual Activity  . Alcohol use: Yes    Alcohol/week: 1.0 standard drink    Types: 1 Glasses of wine per week  . Drug use: No  . Sexual activity: Not on file  Other Topics Concern  . Not on file  Social History Narrative   Former Lawyer at [employer], now Optician, dispensing.    Married   3 daughters- all live locally   Has 7 grandchildren and 1 great grandchild   Enjoys reading, Geophysicist/field seismologist         Lives in a one story  home with a bonus room over the garage.   Social Determinants of Health   Financial Resource Strain: Not on file  Food Insecurity: Not on file  Transportation Needs: Not on file  Physical Activity: Not on file  Stress: Not on file  Social Connections: Not on file  Intimate Partner Violence: Not on file    Outpatient Medications Prior to Visit  Medication Sig Dispense Refill  . albuterol (VENTOLIN HFA) 108 (90 Base) MCG/ACT inhaler INHALE 2 PUFFS INTO THE LUNGS EVERY 6 HOURS AS NEEDED FOR WHEEZING OR SHORTNESS OF BREATH 8.5 g 0  . allopurinol (ZYLOPRIM) 100 MG tablet TAKE 1 TABLET(100 MG) BY MOUTH TWICE DAILY 180 tablet 1  . amLODipine (NORVASC) 5 MG tablet TAKE 1 TABLET(5 MG) BY MOUTH DAILY 30 tablet 3  . calcium carbonate (OS-CAL - DOSED IN MG OF ELEMENTAL CALCIUM) 1250 (500 Ca) MG tablet Take 1 tablet by mouth daily.    . Cholecalciferol (VITAMIN D3) 5000 units CAPS Take by mouth.    . clotrimazole-betamethasone (LOTRISONE) cream Apply 1 application topically 2 (two) times daily. 30 g 0  . COVID-19 mRNA Vac-TriS, Pfizer, SUSP injection Inject into the muscle. 0.3 mL 0  . COVID-19 mRNA vaccine, Pfizer, 30 MCG/0.3ML injection INJECT AS DIRECTED .3 mL 0  . docusate sodium (COLACE) 100 MG capsule Take 200 mg by mouth daily.     Marland Kitchen EPINEPHrine 0.3 mg/0.3 mL IJ SOAJ injection Use as directed for severe allergic reactions 4 each 3  . fexofenadine (ALLEGRA) 180 MG tablet Take 180 mg by mouth daily.     . influenza vaccine adjuvanted (FLUAD) 0.5 ML injection INJECT AS DIRECTED .5 mL 0  . ipratropium-albuterol (DUONEB) 0.5-2.5 (3) MG/3ML SOLN 1 vial in nebulizer every 4-6 hours while awake for the next 3-5 days for coughing or wheezing. 360 mL 1  . levothyroxine (SYNTHROID) 75 MCG tablet TAKE 1 TABLET(75 MCG) BY MOUTH DAILY BEFORE AND BREAKFAST 90 tablet 1  . Magnesium 250 MG TABS Take by mouth.    . mometasone-formoterol (DULERA) 200-5 MCG/ACT AERO USE 2 INHALATIONS TWICE A DAY 13 g 0  .  montelukast (SINGULAIR) 10 MG tablet TAKE 1 TABLET(10 MG) BY MOUTH AT BEDTIME  30 tablet 5  . tiZANidine (ZANAFLEX) 2 MG tablet Take 1-2 tablets (2-4 mg total) by mouth at bedtime as needed for muscle spasms. 60 tablet 1  . umeclidinium bromide (INCRUSE ELLIPTA) 62.5 MCG/INH AEPB USE 1 INHALATION DAILY 30 each 0   Facility-Administered Medications Prior to Visit  Medication Dose Route Frequency Provider Last Rate Last Admin  . 0.9 %  sodium chloride infusion  500 mL Intravenous Once Gatha Mayer, MD      . Benralizumab SOSY 30 mg  30 mg Subcutaneous Q8 Weeks Bobbitt, [place] Muta, MD   30 mg at 07/12/19 1546    Allergies  Allergen Reactions  . Baclofen Shortness Of Breath    Asthma exacerbation  . Colchicine Other (See Comments)    Pancreatitis  . Doxycycline Hives  . Statins Other (See Comments)    Joint stiffness  . Avelox [Moxifloxacin Hcl In Nacl] Other (See Comments)    SEIZURE  . Bactrim [Sulfamethoxazole-Trimethoprim] Hives  . Cefdinir Hives  . Losartan     Rash   . Oregano [Origanum Oil] Cough  . Peanut-Containing Drug Products Swelling    Throat swelling  . Adhesive  [Tape] Other (See Comments)    "tears skin"  . Penicillins Rash    Review of Systems  HENT:  Positive for congestion, ear pain (pressure in left ear) and sinus pain.   Respiratory:  Positive for shortness of breath.   Skin:        (+) Bug bite on waistband area      Objective:    Physical Exam Constitutional:      General: He is not in acute distress.    Appearance: Normal appearance. He is not ill-appearing.  HENT:     Head: Normocephalic and atraumatic.     Comments: (+) mild swelling in left cheek    Right Ear: External ear normal.     Left Ear: External ear normal.  Eyes:     Extraocular Movements: Extraocular movements intact.     Pupils: Pupils are equal, round, and reactive to light.  Cardiovascular:     Rate and Rhythm: Normal rate and regular rhythm.     Heart sounds: Normal  heart sounds. No murmur heard.   No gallop.  Pulmonary:     Effort: Pulmonary effort is normal. No respiratory distress.     Breath sounds: Normal breath sounds. No wheezing or rales.     Comments: (+) lack of air reaching lower part of lungs Lymphadenopathy:     Cervical: No cervical adenopathy.  Skin:    General: Skin is warm and dry.     Comments: Small scab noted right waistline from insect bite, minimal surrounding erythema  Neurological:     Mental Status: He is alert and oriented to person, place, and time.  Psychiatric:        Behavior: Behavior normal.        Judgment: Judgment normal.    BP 134/83 (BP Location: Right Arm, Patient Position: Sitting, Cuff Size: Small)   Pulse (!) 51   Temp 97.8 F (36.6 C) (Oral)   Resp 16   Wt 214 lb (97.1 kg)   SpO2 99%   BMI 34.54 kg/m  Wt Readings from Last 3 Encounters:  01/12/21 214 lb (97.1 kg)  11/16/20 228 lb (103.4 kg)  10/21/20 234 lb (106.1 kg)       Assessment & Plan:   Problem List Items Addressed This Visit  Unprioritized   Acute sinusitis    Due to multiple drug allergies, with rx with azithromycin.  Pt is advised to call if new/worsening symptoms or if symptoms do not improve.       Relevant Medications   azithromycin (ZITHROMAX) 250 MG tablet   Other Visit Diagnoses     Needs flu shot    -  Primary   Relevant Orders   Flu Vaccine QUAD High Dose(Fluad) (Completed)      Meds ordered this encounter  Medications  . azithromycin (ZITHROMAX) 250 MG tablet    Sig: Take 2 tablets on day 1, then 1 tablet daily on days 2 through 5    Dispense:  6 tablet    Refill:  0    Order Specific Question:   Supervising Provider    Answer:   Penni Homans A [[id number]]    I, Debbrah Alar, NP, personally preformed the services described in this documentation.  All medical record entries made by the scribe were at my direction and in my presence.  I have reviewed the chart and discharge instructions (if  applicable) and agree that the record reflects my personal performance and is accurate and complete. 01/12/2021  I,Lyric Barr-McArthur,acting as a Education administrator for Nance Pear, NP.,have documented all relevant documentation on the behalf of Nance Pear, NP,as directed by  Nance Pear, NP while in the presence of Nance Pear, NP.    Nance Pear, NP

## 2021-01-14 NOTE — Assessment & Plan Note (Signed)
Due to multiple drug allergies, with rx with azithromycin.  Pt is advised to call if new/worsening symptoms or if symptoms do not improve.

## 2021-01-20 ENCOUNTER — Ambulatory Visit: Payer: Managed Care, Other (non HMO) | Attending: Internal Medicine

## 2021-01-20 DIAGNOSIS — Z23 Encounter for immunization: Secondary | ICD-10-CM

## 2021-01-20 NOTE — Progress Notes (Signed)
   Covid-19 Vaccination Clinic  Name:  Michael [name]    MRN: [id number] DOB: [birth year]  01/20/2021  [name] was observed post Covid-19 immunization for 15 minutes without incident. He was provided with Vaccine Information Sheet and instruction to access the V-Safe system.   [name] was instructed to call 911 with any severe reactions post vaccine: Difficulty breathing  Swelling of face and throat  A fast heartbeat  A bad rash all over body  Dizziness and weakness

## 2021-01-29 ENCOUNTER — Other Ambulatory Visit: Payer: Self-pay | Admitting: Family Medicine

## 2021-02-01 ENCOUNTER — Other Ambulatory Visit (HOSPITAL_BASED_OUTPATIENT_CLINIC_OR_DEPARTMENT_OTHER): Payer: Self-pay

## 2021-02-01 ENCOUNTER — Telehealth: Payer: Self-pay

## 2021-02-01 MED ORDER — INCRUSE ELLIPTA 62.5 MCG/INH IN AEPB: INHALATION_SPRAY | RESPIRATORY_TRACT | 0 refills | Status: DC

## 2021-02-01 MED ORDER — COVID-19MRNA BIVAL VACC PFIZER 30 MCG/0.3ML IM SUSP
INTRAMUSCULAR | 0 refills | Status: DC
  Filled 2021-02-01: qty 0.3, 1d supply, fill #0

## 2021-02-01 NOTE — Telephone Encounter (Signed)
Courtesy refill sent in for Incruse Ellipta patient has an appointment scheduled 01/2021

## 2021-02-10 ENCOUNTER — Ambulatory Visit (INDEPENDENT_AMBULATORY_CARE_PROVIDER_SITE_OTHER): Payer: Managed Care, Other (non HMO) | Admitting: Family Medicine

## 2021-02-10 ENCOUNTER — Encounter: Payer: Self-pay | Admitting: Family Medicine

## 2021-02-10 ENCOUNTER — Other Ambulatory Visit: Payer: Self-pay

## 2021-02-10 VITALS — BP 138/80 | HR 52 | Temp 97.5°F | Resp 16 | Ht 66.0 in | Wt 212.4 lb

## 2021-02-10 DIAGNOSIS — K219 Gastro-esophageal reflux disease without esophagitis: Secondary | ICD-10-CM

## 2021-02-10 DIAGNOSIS — H1010 Acute atopic conjunctivitis, unspecified eye: Secondary | ICD-10-CM | POA: Diagnosis not present

## 2021-02-10 DIAGNOSIS — J4551 Severe persistent asthma with (acute) exacerbation: Secondary | ICD-10-CM

## 2021-02-10 DIAGNOSIS — Z8709 Personal history of other diseases of the respiratory system: Secondary | ICD-10-CM

## 2021-02-10 DIAGNOSIS — J3089 Other allergic rhinitis: Secondary | ICD-10-CM

## 2021-02-10 DIAGNOSIS — T781XXD Other adverse food reactions, not elsewhere classified, subsequent encounter: Secondary | ICD-10-CM

## 2021-02-10 MED ORDER — AIRDUO DIGIHALER 232-14 MCG/ACT IN AEPB: 1.0000 | INHALATION_SPRAY | Freq: Two times a day (BID) | RESPIRATORY_TRACT | 5 refills | Status: DC

## 2021-02-10 NOTE — Patient Instructions (Addendum)
Asthma Begin AirDuo 232-1 puff twice a day with a spacer to prevent cough or wheeze. This will replace Dulera 200 (due to cost) Continue Incruse Ellipta 1 puff once a day to prevent cough or wheeze Continue montelukast 10 mg once a day to prevent cough or wheeze Continue albuterol 2 puffs every 4 hours as needed for cough or wheeze OR instead use albuterol 0.083% solution via nebulizer one unit vial every 4 hours as needed for cough or wheeze OR instead use DuoNeb via nebulizer once every 4 hours as needed for cough or wheeze Begin prednisone 10 mg tablets. Take 2 tablets twice a day for 3 days, then take 2 tablets once a day for 1 day, then take 1 tablet on the 5th day, then stop   Allergic rhinitis  Continue Allegra once a day as needed for runny nose. Remember to rotate to a different antihistamine about every 3 months. Some examples of over the counter antihistamines include Zyrtec (cetirizine), Xyzal (levocetirizine), Allegra (fexofenadine), and Claritin (loratidine).  Continue Flonase and nasal spray 2 sprays in each nostril once a day.  In the right nostril, point the applicator out toward the right ear. In the left nostril, point the applicator out toward the left ear Consider saline nasal rinses as needed for nasal symptoms. Use this before any medicated nasal sprays for best result  Reflux Continue dietary and lifestyle modifications as listed below Continue omeprazole daily to control reflux  Penicillin allergy Return to the clinic for penicillin skin testing when it is convenient for you.  Food allergy Continue to avoid peanut, tree nut, oregano. In case of an allergic reaction, take Benadryl 50 mg every 4 hours, and if life-threatening symptoms occur, inject with EpiPen 0.3 mg. Return to the clinic if you are interested in updating your food allergies.  Remember to stop antihistamines for 3 days before your allergy testing appointment.  History of nasal polyp Continue Flonase as  listed above  Call the clinic if this treatment plan is not working well for you  Follow up in 2 months or sooner if needed.

## 2021-02-10 NOTE — Progress Notes (Signed)
[place] [id number] Dept: [phone]  FOLLOW UP NOTE  Patient ID: Michael [name], male    DOB: Jul 13, 1950  Age: 70 y.o. MRN: [id number] Date of Office Visit: 02/10/2021  Assessment  Chief Complaint: Asthma  HPI Michael [name] is a 70 year old male who presents the clinic for follow-up visit.  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  At today's visit, he reports his asthma has not been well controlled for the last 3 weeks with symptoms including intermittent chest tightness, shortness of breath with activity and talking, and dry cough occurring in spells especially in the morning over the last 4 days.  He continues Dulera 200, Incruse Ellipta, montelukast 10 mg once a day, and uses albuterol about 3 times a week with moderate relief of symptoms.  He has previously used [place] and Dupixent with significant relief of symptoms, however, he reports that his insurance will no longer pay for any biologic medications.  He also used Xolair with no perceived benefit in the past. Allergic rhinitis is reported as moderately well controlled with symptoms including clear rhinorrhea, sinus pressure on the left side, sneezing, and occasional postnasal drainage.  He did recently visit his primary care provider and received azithromycin for acute sinusitis.  He continues Allegra 180 mg daily, Flonase and saline nasal rinses as needed.  Reflux is reported as well controlled with omeprazole daily.  He continues Flonase daily for control of nasal polyposis.  He continues to avoid peanuts, tree nuts, and oregano with no accidental ingestion or EpiPen use since his last visit to this clinic.  His current medications are listed in the chart.   Drug Allergies:  Allergies  Allergen Reactions   Baclofen Shortness Of Breath    Asthma  exacerbation   Colchicine Other (See Comments)    Pancreatitis   Doxycycline Hives   Statins Other (See Comments)    Joint stiffness   Avelox [Moxifloxacin Hcl In Nacl] Other (See Comments)    SEIZURE   Bactrim [Sulfamethoxazole-Trimethoprim] Hives   Cefdinir Hives   Losartan     Rash    Oregano [Origanum Oil] Cough   Peanut-Containing Drug Products Swelling    Throat swelling   Adhesive  [Tape] Other (See Comments)    "tears skin"   Penicillins Rash    Physical Exam: BP 138/80   Pulse (!) 52   Temp (!) 97.5 F (36.4 C) (Temporal)   Resp 16   Ht 5\' 6"  (1.676 m)   Wt 212 lb 6.4 oz (96.3 kg)   SpO2 99%   BMI 34.28 kg/m    Physical Exam Vitals reviewed.  Constitutional:      Appearance: Normal appearance.  HENT:     Head: Normocephalic and atraumatic.     Right Ear: Tympanic membrane normal.     Left Ear: Tympanic membrane normal.     Nose:     Comments: Bilateral nares slightly erythematous with clear nasal drainage noted.  Pharynx normal.  Ears normal.  Eyes normal.    Mouth/Throat:     Pharynx: Oropharynx is clear.  Eyes:     Conjunctiva/sclera: Conjunctivae normal.  Cardiovascular:     Rate and Rhythm: Normal rate and regular rhythm.     Heart sounds: Normal heart sounds. No murmur heard. Pulmonary:     Effort: Pulmonary effort is normal.  Breath sounds: Normal breath sounds.     Comments: Lungs clear to auscultation Musculoskeletal:        General: Normal range of motion.     Cervical back: Normal range of motion and neck supple.  Skin:    General: Skin is warm and dry.  Neurological:     Mental Status: He is alert and oriented to person, place, and time.  Psychiatric:        Mood and Affect: Mood normal.        Behavior: Behavior normal.        Thought Content: Thought content normal.        Judgment: Judgment normal.    Diagnostics: FVC 3.12, FEV1 2.06.  Predicted FVC 3.73, predicted FEV1 2.86.  Spirometry indicates normal ventilatory  function.  Postbronchodilator FVC 3.10, FEV1 2.15.  Postbronchodilator spirometry indicates no significant improvement.  Assessment and Plan: 1. Severe persistent asthma with acute exacerbation   2. Other allergic rhinitis   3. Seasonal allergic conjunctivitis   4. History of nasal polyp   5. Gastroesophageal reflux disease, unspecified whether esophagitis present   6. Adverse food reaction, subsequent encounter     Meds ordered this encounter  Medications   Fluticasone-Salmeterol,sensor, (AIRDUO DIGIHALER) 232-14 MCG/ACT AEPB    Sig: Inhale 1 puff into the lungs 2 (two) times daily.    Dispense:  1 each    Refill:  5     Patient Instructions  Asthma Begin AirDuo 232-1 puff twice a day with a spacer to prevent cough or wheeze. This will replace Dulera 200 (due to cost) Continue Incruse Ellipta 1 puff once a day to prevent cough or wheeze Continue montelukast 10 mg once a day to prevent cough or wheeze Continue albuterol 2 puffs every 4 hours as needed for cough or wheeze OR instead use albuterol 0.083% solution via nebulizer one unit vial every 4 hours as needed for cough or wheeze OR instead use DuoNeb via nebulizer once every 4 hours as needed for cough or wheeze Begin prednisone 10 mg tablets. Take 2 tablets twice a day for 3 days, then take 2 tablets once a day for 1 day, then take 1 tablet on the 5th day, then stop   Allergic rhinitis  Continue Allegra once a day as needed for runny nose. Remember to rotate to a different antihistamine about every 3 months. Some examples of over the counter antihistamines include Zyrtec (cetirizine), Xyzal (levocetirizine), Allegra (fexofenadine), and Claritin (loratidine).  Continue Flonase and nasal spray 2 sprays in each nostril once a day.  In the right nostril, point the applicator out toward the right ear. In the left nostril, point the applicator out toward the left ear Consider saline nasal rinses as needed for nasal symptoms. Use this  before any medicated nasal sprays for best result  Reflux Continue dietary and lifestyle modifications as listed below Continue omeprazole daily to control reflux  Penicillin allergy Return to the clinic for penicillin skin testing when it is convenient for you.  Food allergy Continue to avoid peanut, tree nut, oregano. In case of an allergic reaction, take Benadryl 50 mg every 4 hours, and if life-threatening symptoms occur, inject with EpiPen 0.3 mg. Return to the clinic if you are interested in updating your food allergies.  Remember to stop antihistamines for 3 days before your allergy testing appointment.  History of nasal polyp Continue Flonase as listed above  Call the clinic if this treatment plan is not working well for you  Follow up in 2 months or sooner if needed.  Return in about 2 months (around 04/12/2021), or if symptoms worsen or fail to improve.    Thank you for the opportunity to care for this patient.  Please do not hesitate to contact me with questions.  Gareth Morgan, FNP [facility]

## 2021-02-12 ENCOUNTER — Other Ambulatory Visit: Payer: Self-pay

## 2021-02-12 ENCOUNTER — Ambulatory Visit (INDEPENDENT_AMBULATORY_CARE_PROVIDER_SITE_OTHER): Payer: Managed Care, Other (non HMO) | Admitting: Family

## 2021-02-12 ENCOUNTER — Encounter: Payer: Self-pay | Admitting: Family Medicine

## 2021-02-12 VITALS — BP 149/92 | HR 64 | Temp 98.1°F | Resp 16 | Wt 210.0 lb

## 2021-02-12 DIAGNOSIS — J4551 Severe persistent asthma with (acute) exacerbation: Secondary | ICD-10-CM

## 2021-02-12 DIAGNOSIS — M1A9XX0 Chronic gout, unspecified, without tophus (tophi): Secondary | ICD-10-CM

## 2021-02-12 DIAGNOSIS — J329 Chronic sinusitis, unspecified: Secondary | ICD-10-CM | POA: Diagnosis not present

## 2021-02-12 DIAGNOSIS — E039 Hypothyroidism, unspecified: Secondary | ICD-10-CM | POA: Diagnosis not present

## 2021-02-12 DIAGNOSIS — I10 Essential (primary) hypertension: Secondary | ICD-10-CM | POA: Diagnosis not present

## 2021-02-12 DIAGNOSIS — R001 Bradycardia, unspecified: Secondary | ICD-10-CM

## 2021-02-12 LAB — TSH: TSH: 0.98 u[IU]/mL (ref 0.35–5.50)

## 2021-02-12 MED ORDER — AZITHROMYCIN 250 MG PO TABS: ORAL_TABLET | ORAL | 0 refills | Status: AC

## 2021-02-12 NOTE — Assessment & Plan Note (Signed)
Chronic, asymptomatic. Has seen cardiology in the past. Not on rate limiting drugs. Monitor.

## 2021-02-12 NOTE — Assessment & Plan Note (Addendum)
Fair control.  Seems to be improving with steroid taper prescribed by Allergy/Asthma clinic.

## 2021-02-12 NOTE — Progress Notes (Signed)
Subjective:   By signing my name below, I, Michael [name], attest that this documentation has been prepared under the direction and in the presence of Debbrah Alar, NP 02/12/2021     Patient ID: Michael [name], male    DOB: [birth year], 70 y.o.   MRN: [id number]  Chief Complaint  Patient presents with   Hypertension    Here for follow up   Sinus Problem    Still having sinus problems, now with left ear pressure    HPI Patient is in today for an office visit  Sinus Infection- He presented for a sinus problems on 01/14/2021 and was prescribed 250 mg Zithromax . He reports that his symptoms have improved but have not resolved completely. He mentions the pain is also radiating to his ear.  Allergies- He recently visited the asthma clinic and was given a taper of prednisone. He mentions he has been feeling better and his breathing has improved greatly. It was also noticed that he had a low heart rate of 48.  Heart rate- He mentions that he has a bed that helps to keep track of his heart rate and it is normally in the mid-40's. He denies dizziness but endorses occasional moments of confusion. At this visit, his pulse is 68 and he attributes it to the prednisone.  Blood pressure - His blood pressure is high at this visit but believes it is because he has been at work since 5 am today. He monitors his blood pressure and they normally range between 629-528 and the systolic 41'L.  BP Readings from Last 3 Encounters:  02/12/21 (!) 149/92  02/10/21 138/80  01/12/21 134/83     Gout- He has had no recent gout flare-ups but believes he will have one after he finishes the prednisone taper. He is actively avoiding foods that promote gout and is consistent with Weight [employer].   Immunizations- He is UTD on the flu and Covid-19 vaccines.  Past Medical History:  Diagnosis Date   Allergy    Arthritis    Asthma    Cancer ([place])    basal cell and squamous cell carcinoma   Cancer (HCC)     prostate   Clotting disorder (HCC)    Colon polyps    GERD (gastroesophageal reflux disease)    Gout    High cholesterol    History of hepatitis    due to drug interaction?   History of pancreatitis 2016   ?due to colchicine   History of pulmonary embolus (PE) 2016   History of stomach ulcers    Hypertension    Hypothyroidism    OSA (obstructive sleep apnea) 2015   has CPAP   Pneumonia    Sleep apnea    Thyroid disease    hypothyroid   Urinary incontinence     Past Surgical History:  Procedure Laterality Date   BASAL CELL CARCINOMA EXCISION  2017   MOHS.  beneath right eye   CHOLECYSTECTOMY  2001   COLONOSCOPY     multiple - last 06/2017   HIP SURGERY     PENILE PROSTHESIS IMPLANT  2016   PROSTATECTOMY  2009   SINUS SURGERY WITH INSTATRAK  2014   SQUAMOUS CELL CARCINOMA EXCISION  01/2017   nose   TOOTH EXTRACTION     TOTAL HIP ARTHROPLASTY  06/2016    Family History  Problem Relation Age of Onset   Asthma Mother    Arthritis Mother    Skin cancer Mother  basal cell carcinoma   Kidney failure Mother    Atrial fibrillation Mother    Asthma Father    Arthritis Father    Diabetes Father    Heart attack Father    Hyperlipidemia Father    Hypertension Father    Kidney disease Father    Diabetes Brother    Arthritis Brother    Hyperlipidemia Brother    Brain cancer Brother        GBM   Alcohol abuse Brother    Breast cancer Maternal Grandmother    Diabetes Paternal Grandfather    Colon cancer Neg Hx    Esophageal cancer Neg Hx    Liver cancer Neg Hx    Pancreatic cancer Neg Hx    Rectal cancer Neg Hx    Stomach cancer Neg Hx     Social History   Socioeconomic History   Marital status: Married    Spouse name: Not on file   Number of children: 3   Years of education: Not on file   Highest education level: Not on file  Occupational History   Not on file  Tobacco Use   Smoking status: Never   Smokeless tobacco: Never  Vaping Use    Vaping Use: Never used  Substance and Sexual Activity   Alcohol use: Yes    Alcohol/week: 1.0 standard drink    Types: 1 Glasses of wine per week   Drug use: No   Sexual activity: Not on file  Other Topics Concern   Not on file  Social History Narrative   Former Lawyer at [employer], now Optician, dispensing.    Married   3 daughters- all live locally   Has 7 grandchildren and 1 great grandchild   Enjoys reading, Geophysicist/field seismologist         Lives in a one story home with a bonus room over the garage.   Social Determinants of Health   Financial Resource Strain: Not on file  Food Insecurity: Not on file  Transportation Needs: Not on file  Physical Activity: Not on file  Stress: Not on file  Social Connections: Not on file  Intimate Partner Violence: Not on file    Outpatient Medications Prior to Visit  Medication Sig Dispense Refill   albuterol (VENTOLIN HFA) 108 (90 Base) MCG/ACT inhaler INHALE 2 PUFFS INTO THE LUNGS EVERY 6 HOURS AS NEEDED FOR WHEEZING OR SHORTNESS OF BREATH 8.5 g 0   allopurinol (ZYLOPRIM) 100 MG tablet TAKE 1 TABLET(100 MG) BY MOUTH TWICE DAILY 180 tablet 1   amLODipine (NORVASC) 5 MG tablet TAKE 1 TABLET(5 MG) BY MOUTH DAILY 30 tablet 3   calcium carbonate (OS-CAL - DOSED IN MG OF ELEMENTAL CALCIUM) 1250 (500 Ca) MG tablet Take 1 tablet by mouth daily.     Cholecalciferol (VITAMIN D3) 5000 units CAPS Take by mouth.     clotrimazole-betamethasone (LOTRISONE) cream Apply 1 application topically 2 (two) times daily. 30 g 0   COVID-19 mRNA bivalent vaccine, Pfizer, injection Inject into the muscle. 0.3 mL 0   docusate sodium (COLACE) 100 MG capsule Take 200 mg by mouth daily.      EPINEPHrine 0.3 mg/0.3 mL IJ SOAJ injection Use as directed for severe allergic reactions 4 each 3   fexofenadine (ALLEGRA) 180 MG tablet Take 180 mg by mouth daily.      Fluticasone-Salmeterol,sensor, (AIRDUO DIGIHALER) 232-14 MCG/ACT AEPB Inhale 1 puff into the lungs 2  (two) times daily. 1 each 5   ipratropium-albuterol (  DUONEB) 0.5-2.5 (3) MG/3ML SOLN 1 vial in nebulizer every 4-6 hours while awake for the next 3-5 days for coughing or wheezing. 360 mL 1   levothyroxine (SYNTHROID) 75 MCG tablet TAKE 1 TABLET(75 MCG) BY MOUTH DAILY BEFORE AND BREAKFAST 90 tablet 1   Magnesium 250 MG TABS Take by mouth.     montelukast (SINGULAIR) 10 MG tablet TAKE 1 TABLET(10 MG) BY MOUTH AT BEDTIME 30 tablet 0   tiZANidine (ZANAFLEX) 2 MG tablet Take 1-2 tablets (2-4 mg total) by mouth at bedtime as needed for muscle spasms. 60 tablet 1   umeclidinium bromide (INCRUSE ELLIPTA) 62.5 MCG/INH AEPB USE 1 INHALATION DAILY 30 each 0   Facility-Administered Medications Prior to Visit  Medication Dose Route Frequency Provider Last Rate Last Admin   0.9 %  sodium chloride infusion  500 mL Intravenous Once Gatha Mayer, MD       Benralizumab SOSY 30 mg  30 mg Subcutaneous Q8 Weeks Bobbitt, [place] Muta, MD   30 mg at 07/12/19 1546    Allergies  Allergen Reactions   Baclofen Shortness Of Breath    Asthma exacerbation   Colchicine Other (See Comments)    Pancreatitis   Doxycycline Hives   Statins Other (See Comments)    Joint stiffness   Avelox [Moxifloxacin Hcl In Nacl] Other (See Comments)    SEIZURE   Bactrim [Sulfamethoxazole-Trimethoprim] Hives   Cefdinir Hives   Losartan     Rash    Oregano [Origanum Oil] Cough   Peanut-Containing Drug Products Swelling    Throat swelling   Adhesive  [Tape] Other (See Comments)    "tears skin"   Penicillins Rash    Review of Systems  Constitutional:  Negative for fever.  HENT:  Positive for ear pain (right) and sinus pain. Negative for hearing loss.        (-)nystagmus (-)adenopathy  Eyes:  Negative for blurred vision.  Respiratory:  Negative for cough, shortness of breath and wheezing.   Cardiovascular:  Negative for chest pain and leg swelling.  Gastrointestinal:  Negative for blood in stool, diarrhea, nausea and  vomiting.  Genitourinary:  Negative for dysuria and frequency.  Musculoskeletal:  Negative for joint pain and myalgias.  Skin:  Negative for rash.  Neurological:  Negative for headaches.  Psychiatric/Behavioral:  Negative for depression. The patient is not nervous/anxious.       Objective:    Physical Exam Constitutional:      General: He is not in acute distress.    Appearance: Normal appearance.  HENT:     Head: Normocephalic and atraumatic.  Cardiovascular:     Rate and Rhythm: Normal rate and regular rhythm.     Heart sounds: No murmur heard. Pulmonary:     Effort: No respiratory distress.     Breath sounds: Examination of the right-lower field reveals decreased breath sounds. Examination of the left-lower field reveals decreased breath sounds. Decreased breath sounds present. No wheezing or rales.  Skin:    General: Skin is warm and dry.  Neurological:     Mental Status: He is alert and oriented to person, place, and time.  Psychiatric:        Behavior: Behavior normal.        Thought Content: Thought content normal.    BP (!) 149/92 (BP Location: Right Arm, Patient Position: Sitting, Cuff Size: Small)   Pulse 64   Temp 98.1 F (36.7 C) (Oral)   Resp 16   Wt 210 lb (95.3  kg)   SpO2 100%   BMI 33.89 kg/m  Wt Readings from Last 3 Encounters:  02/12/21 210 lb (95.3 kg)  02/10/21 212 lb 6.4 oz (96.3 kg)  01/12/21 214 lb (97.1 kg)    Diabetic Foot Exam - Simple   No data filed    Lab Results  Component Value Date   WBC 6.8 12/18/2019   HGB 16.1 12/18/2019   HCT 46.7 12/18/2019   PLT 263 12/18/2019   GLUCOSE 96 07/03/2020   CHOL 191 12/18/2019   TRIG 141 12/18/2019   HDL 44 12/18/2019   LDLCALC 122 (H) 12/18/2019   ALT 26 12/18/2019   AST 15 12/18/2019   NA 141 07/03/2020   K 4.6 07/03/2020   CL 105 07/03/2020   CREATININE 1.27 07/03/2020   BUN 18 07/03/2020   CO2 29 07/03/2020   TSH 3.60 12/18/2019   PSA 0.00 Repeated and verified X2. (L)  07/03/2020    Lab Results  Component Value Date   TSH 3.60 12/18/2019   Lab Results  Component Value Date   WBC 6.8 12/18/2019   HGB 16.1 12/18/2019   HCT 46.7 12/18/2019   MCV 94.2 12/18/2019   PLT 263 12/18/2019   Lab Results  Component Value Date   NA 141 07/03/2020   K 4.6 07/03/2020   CO2 29 07/03/2020   GLUCOSE 96 07/03/2020   BUN 18 07/03/2020   CREATININE 1.27 07/03/2020   BILITOT 0.5 12/18/2019   ALKPHOS 63 06/09/2018   AST 15 12/18/2019   ALT 26 12/18/2019   PROT 6.2 12/18/2019   ALBUMIN 4.2 06/09/2018   CALCIUM 9.2 07/03/2020   ANIONGAP 8 06/09/2018   GFR 58.20 (L) 07/03/2020   Lab Results  Component Value Date   CHOL 191 12/18/2019   Lab Results  Component Value Date   HDL 44 12/18/2019   Lab Results  Component Value Date   LDLCALC 122 (H) 12/18/2019   Lab Results  Component Value Date   TRIG 141 12/18/2019   Lab Results  Component Value Date   CHOLHDL 4.3 12/18/2019   No results found for: HGBA1C     Assessment & Plan:   Problem List Items Addressed This Visit       Unprioritized   Severe persistent asthma    Fair control.  Seems to be improving with steroid taper prescribed by Allergy/Asthma clinic.       Recurrent sinusitis - Primary    Repeat azithromycin and refer to ENT for further evaluation.       Relevant Medications   azithromycin (ZITHROMAX) 250 MG tablet   Other Relevant Orders   Ambulatory referral to ENT   Hypothyroid    Lab Results  Component Value Date   TSH 3.60 12/18/2019  Clinically stable on synthroid 76mcg once daily.       Relevant Orders   TSH   Hypertension    BP Readings from Last 3 Encounters:  02/12/21 (!) 149/92  02/10/21 138/80  01/12/21 134/83  Currently on amlodipine 5mg .  BP's better at home.       Gout    Stable on allopurinol. Continue same.       Bradycardia    Chronic, asymptomatic. Has seen cardiology in the past. Not on rate limiting drugs. Monitor.        Meds  ordered this encounter  Medications   azithromycin (ZITHROMAX) 250 MG tablet    Sig: Take 2 tablets on day 1, then 1 tablet daily on  days 2 through 5    Dispense:  6 tablet    Refill:  0    Order Specific Question:   Supervising Provider    Answer:   Penni Homans A [[id number]]    I,Michael [name],acting as a scribe for Nance Pear, NP.,have documented all relevant documentation on the behalf of Nance Pear, NP,as directed by  Nance Pear, NP while in the presence of Nance Pear, NP.   I, Debbrah Alar, NP, personally preformed the services described in this documentation.  All medical record entries made by the scribe were at my direction and in my presence.  I have reviewed the chart and discharge instructions (if applicable) and agree that the record reflects my personal performance and is accurate and complete. 02/12/2021

## 2021-02-12 NOTE — Assessment & Plan Note (Signed)
Stable on allopurinol.  Continue same.  

## 2021-02-12 NOTE — Assessment & Plan Note (Addendum)
Lab Results  Component Value Date   TSH 3.60 12/18/2019   Clinically stable on synthroid 79mcg once daily.

## 2021-02-12 NOTE — Assessment & Plan Note (Signed)
Repeat azithromycin and refer to ENT for further evaluation.

## 2021-02-12 NOTE — Assessment & Plan Note (Signed)
BP Readings from Last 3 Encounters:  02/12/21 (!) 149/92  02/10/21 138/80  01/12/21 134/83   Currently on amlodipine 5mg .  BP's better at home.

## 2021-02-15 ENCOUNTER — Telehealth: Payer: Self-pay | Admitting: *Deleted

## 2021-02-15 NOTE — Telephone Encounter (Signed)
I had looked back at notes and patient failed Rosalene Billings and Dupixent. He wanted to retry Nucala at one point but they do not issue copay card to age 68 and over.  We can try him on Tezspire if he wants to try something other than the biologics that had failed to control his symptoms. L/M for patient to call me to discuss

## 2021-02-15 NOTE — Telephone Encounter (Signed)
-----   Message from [name], FNP sent at 02/10/2021  5:17 PM EDT ----- Hi there Bevely Hackbart. This patient has been on Xolair which he stopped due to lack of efficacy. He reports that he was also on [place] and [place] which he reports that he had to stop because his insurance company stopped paying for the drug. 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 Thank you

## 2021-02-17 NOTE — Telephone Encounter (Signed)
Please have him call back if he needs anything or if his breathing worsens.  Thank you

## 2021-02-17 NOTE — Telephone Encounter (Signed)
He said it is ok but not great but wants to try to get by without prednisone

## 2021-02-17 NOTE — Addendum Note (Signed)
Addended by: Isabel Caprice on: 02/17/2021 10:29 AM   Modules accepted: Orders

## 2021-02-17 NOTE — Telephone Encounter (Signed)
Can you please call this patient and find out how he is breathing? We can adjust his pred based on how he is breathing. Thank you

## 2021-02-28 ENCOUNTER — Other Ambulatory Visit: Payer: Self-pay | Admitting: Family

## 2021-02-28 ENCOUNTER — Other Ambulatory Visit: Payer: Self-pay | Admitting: Family Medicine

## 2021-03-01 ENCOUNTER — Other Ambulatory Visit: Payer: Self-pay | Admitting: Family Medicine

## 2021-03-01 ENCOUNTER — Other Ambulatory Visit: Payer: Self-pay | Admitting: Family

## 2021-03-01 NOTE — Telephone Encounter (Signed)
L/m for patient again to contact me ?

## 2021-03-10 ENCOUNTER — Encounter: Payer: Self-pay | Admitting: *Deleted

## 2021-03-10 NOTE — Telephone Encounter (Signed)
Mychart message sent.

## 2021-03-12 ENCOUNTER — Other Ambulatory Visit: Payer: Self-pay | Admitting: Family Medicine

## 2021-03-12 ENCOUNTER — Other Ambulatory Visit: Payer: Self-pay

## 2021-03-12 MED ORDER — MONTELUKAST SODIUM 10 MG PO TABS: ORAL_TABLET | ORAL | 1 refills | Status: DC

## 2021-03-15 NOTE — Telephone Encounter (Signed)
Ok. Thank you for your hard work.

## 2021-03-15 NOTE — Telephone Encounter (Signed)
No response to calls or mychart message will assume patient not interested in Tezspire at this time and in future he can reach out to me if he decides to proceed with therapy

## 2021-03-16 ENCOUNTER — Other Ambulatory Visit: Payer: Self-pay | Admitting: Family Medicine

## 2021-03-16 ENCOUNTER — Telehealth: Payer: Self-pay | Admitting: Family Medicine

## 2021-03-16 MED ORDER — QVAR REDIHALER 80 MCG/ACT IN AERB: INHALATION_SPRAY | RESPIRATORY_TRACT | 0 refills | Status: DC

## 2021-03-16 NOTE — Telephone Encounter (Signed)
Talk to the patient on the phone.  He reports that his wife tested positive for COVID and they were both exposed on Saturday.  At this point he is testing negative for COVID.  He does report some chest congestion with slight dry cough over the last 2 days.  He continues montelukast 10 mg once a day, Dulera 202 puffs twice a day with a spacer, and Incruse Ellipta.  He denies fever and nasal congestion.  He does report some clear rhinorrhea.  I have agreed to call in Flovent 110 to use in addition to the inhalers he is currently using should it be necessary.  He will call with worsening symptoms or any questions.  He understands to call 911 with any respiratory distress.

## 2021-03-16 NOTE — Telephone Encounter (Signed)
Can you please call this patient and let him know that Flovent 110 was not covered on his insurance plan so the medication for asthma flare was switched to Qvar 80.  If needed, begin Qvar 80-2 puffs twice a day with a spacer call the clinic.

## 2021-03-16 NOTE — Telephone Encounter (Signed)
Pt states his wife tested positive for covid today and so far he is negative, he is concerned due to his asthma and would like Anne to call him back.

## 2021-03-22 ENCOUNTER — Encounter: Payer: Self-pay | Admitting: Family Medicine

## 2021-03-22 NOTE — Telephone Encounter (Signed)
Called and spoke pt change of inhaler was given, pt verbalized understanding.

## 2021-03-22 NOTE — Telephone Encounter (Signed)
Oh no. Can you please find out when the facial pain began and what other symptoms he is having? Fever? Please ask what he is doing for treatment at this time. Thank you

## 2021-04-01 ENCOUNTER — Other Ambulatory Visit: Payer: Self-pay | Admitting: Family Medicine

## 2021-04-07 NOTE — Telephone Encounter (Signed)
Can you please call and check on how this patient is breathing? Thank you

## 2021-04-07 NOTE — Telephone Encounter (Signed)
Called pt he stated he's doing well, got over a mild case of COVID, he is still working on getting with the ENT and thinks it maybe January but he will continue to work on it.

## 2021-04-08 NOTE — Telephone Encounter (Signed)
Thank you for checking on this patient

## 2021-05-21 ENCOUNTER — Ambulatory Visit: Payer: Managed Care, Other (non HMO) | Admitting: Family

## 2021-05-29 ENCOUNTER — Other Ambulatory Visit: Payer: Self-pay | Admitting: Family

## 2021-05-29 DIAGNOSIS — M109 Gout, unspecified: Secondary | ICD-10-CM

## 2021-05-31 NOTE — Telephone Encounter (Signed)
Appointment 06-08-21

## 2021-06-08 ENCOUNTER — Ambulatory Visit: Payer: Managed Care, Other (non HMO) | Admitting: Family

## 2021-06-09 ENCOUNTER — Other Ambulatory Visit: Payer: Self-pay | Admitting: Allergy

## 2021-06-09 MED ORDER — ALBUTEROL SULFATE HFA 108 (90 BASE) MCG/ACT IN AERS: 2.0000 | INHALATION_SPRAY | Freq: Four times a day (QID) | RESPIRATORY_TRACT | 1 refills | Status: DC | PRN

## 2021-07-14 ENCOUNTER — Ambulatory Visit (INDEPENDENT_AMBULATORY_CARE_PROVIDER_SITE_OTHER): Payer: HMO | Admitting: Family

## 2021-07-14 ENCOUNTER — Encounter: Payer: Self-pay | Admitting: Family

## 2021-07-14 VITALS — BP 135/82 | HR 57 | Resp 18 | Ht 66.0 in | Wt 217.0 lb

## 2021-07-14 DIAGNOSIS — I10 Essential (primary) hypertension: Secondary | ICD-10-CM | POA: Diagnosis not present

## 2021-07-14 DIAGNOSIS — G4733 Obstructive sleep apnea (adult) (pediatric): Secondary | ICD-10-CM

## 2021-07-14 DIAGNOSIS — J45909 Unspecified asthma, uncomplicated: Secondary | ICD-10-CM

## 2021-07-14 DIAGNOSIS — E781 Pure hyperglyceridemia: Secondary | ICD-10-CM | POA: Diagnosis not present

## 2021-07-14 DIAGNOSIS — Z8546 Personal history of malignant neoplasm of prostate: Secondary | ICD-10-CM

## 2021-07-14 DIAGNOSIS — J4551 Severe persistent asthma with (acute) exacerbation: Secondary | ICD-10-CM | POA: Diagnosis not present

## 2021-07-14 DIAGNOSIS — G2581 Restless legs syndrome: Secondary | ICD-10-CM

## 2021-07-14 DIAGNOSIS — M1A9XX0 Chronic gout, unspecified, without tophus (tophi): Secondary | ICD-10-CM

## 2021-07-14 DIAGNOSIS — C61 Malignant neoplasm of prostate: Secondary | ICD-10-CM | POA: Diagnosis not present

## 2021-07-14 DIAGNOSIS — E039 Hypothyroidism, unspecified: Secondary | ICD-10-CM | POA: Diagnosis not present

## 2021-07-14 LAB — LIPID PANEL
Cholesterol: 188 mg/dL (ref 0–200)
HDL: 39.9 mg/dL (ref >39.00)
LDL Cholesterol: 121 mg/dL — ABNORMAL HIGH (ref 0–99)
NonHDL: 148.4
Total CHOL/HDL Ratio: 5
Triglycerides: 138 mg/dL (ref 0.0–149.0)
VLDL: 27.6 mg/dL (ref 0.0–40.0)

## 2021-07-14 LAB — COMPREHENSIVE METABOLIC PANEL
ALT: 18 U/L (ref 0–53)
AST: 15 U/L (ref 0–37)
Albumin: 4.2 g/dL (ref 3.5–5.2)
Alkaline Phosphatase: 54 U/L (ref 39–117)
BUN: 17 mg/dL (ref 6–23)
CO2: 30 mEq/L (ref 19–32)
Calcium: 9.1 mg/dL (ref 8.4–10.5)
Chloride: 103 mEq/L (ref 96–112)
Creatinine, Ser: 1.28 mg/dL (ref 0.40–1.50)
GFR: 57.24 mL/min — ABNORMAL LOW (ref >60.00)
Glucose, Bld: 107 mg/dL — ABNORMAL HIGH (ref 70–99)
Potassium: 4.5 mEq/L (ref 3.5–5.1)
Sodium: 139 mEq/L (ref 135–145)
Total Bilirubin: 0.5 mg/dL (ref 0.2–1.2)
Total Protein: 6.2 g/dL (ref 6.0–8.3)

## 2021-07-14 LAB — TSH: TSH: 3.63 u[IU]/mL (ref 0.35–5.50)

## 2021-07-14 LAB — PSA: PSA: 0 ng/mL — ABNORMAL LOW (ref 0.10–4.00)

## 2021-07-14 MED ORDER — LEVOTHYROXINE SODIUM 75 MCG PO TABS: ORAL_TABLET | ORAL | 1 refills | Status: DC

## 2021-07-14 MED ORDER — AMLODIPINE BESYLATE 5 MG PO TABS: ORAL_TABLET | ORAL | 1 refills | Status: DC

## 2021-07-14 MED ORDER — PREDNISONE 10 MG PO TABS: ORAL_TABLET | ORAL | 0 refills | Status: DC

## 2021-07-14 MED ORDER — ROPINIROLE HCL 0.5 MG PO TABS: 0.5000 mg | ORAL_TABLET | Freq: Every day | ORAL | 0 refills | Status: DC

## 2021-07-14 NOTE — Assessment & Plan Note (Signed)
New. Will give trial of requip 0.'5mg'$  HS.  ?

## 2021-07-14 NOTE — Assessment & Plan Note (Signed)
Stable on allopurinol.  Continue same.  

## 2021-07-14 NOTE — Assessment & Plan Note (Signed)
Uncontrolled. Will give prednisone taper. Refer to pulmonology. ?

## 2021-07-14 NOTE — Assessment & Plan Note (Signed)
Clinically stable on synthroid. Continue same. Check TSH.  ?

## 2021-07-14 NOTE — Assessment & Plan Note (Signed)
BP Readings from Last 3 Encounters:  ?07/14/21 135/82  ?02/12/21 (!) 149/92  ?02/10/21 138/80  ? ?BP stable. Continue amlodipine '5mg'$ .  ?

## 2021-07-14 NOTE — Progress Notes (Signed)
? ?Subjective:  ? ? ? Patient ID: Michael [name], male    DOB: July 15, 1950, 71 y.o.   MRN: [id number] ? ?Chief Complaint  ?Patient presents with  ? Follow-up  ?  3 month , medication refill   ? ? ?HPI ?Patient is in today for follow up.  ? ?HTN- maintained on amlodipine '5mg'$ .  ?BP Readings from Last 3 Encounters:  ?07/14/21 135/82  ?02/12/21 (!) 149/92  ?02/10/21 138/80  ? ?Asthma- has been worse in the evening. On dulera and Incruse Ellipta. Has been bad last 3 weeks.  ? ?Hypothyroid- continues synthroid 75 mcg once daily.  ?Lab Results  ?Component Value Date  ? TSH 0.98 02/12/2021  ? ?OSA- continued on cpap.  Not currently seeing a sleep specialist.  ? ?RLS- keeping him up the first few hours. ? ?Hx prostate cancer- s/p prostatectomy 2009.   ?Lab Results  ?Component Value Date  ? PSA 0.00 Repeated and verified X2. (L) 07/03/2020  ? ? ? ? ? ? ?There are no preventive care reminders to display for this patient. ? ?Past Medical History:  ?Diagnosis Date  ? Allergy   ? Arthritis   ? Asthma   ? Cancer [facility])   ? basal cell and squamous cell carcinoma  ? Cancer [facility])   ? prostate  ? Clotting disorder ([place])   ? Colon polyps   ? GERD (gastroesophageal reflux disease)   ? Gout   ? High cholesterol   ? History of hepatitis   ? due to drug interaction?  ? History of pancreatitis 2016  ? ?due to colchicine  ? History of pulmonary embolus (PE) 2016  ? History of stomach ulcers   ? Hypertension   ? Hypothyroidism   ? OSA (obstructive sleep apnea) 2015  ? has CPAP  ? Pneumonia   ? Sleep apnea   ? Thyroid disease   ? hypothyroid  ? Urinary incontinence   ? ? ?Past Surgical History:  ?Procedure Laterality Date  ? BASAL CELL CARCINOMA EXCISION  2017  ? MOHS.  beneath right eye  ? CHOLECYSTECTOMY  2001  ? COLONOSCOPY    ? multiple - last 06/2017  ? HIP SURGERY    ? PENILE PROSTHESIS IMPLANT  2016  ? PROSTATECTOMY  2009  ? SINUS SURGERY WITH INSTATRAK  2014  ? SQUAMOUS CELL CARCINOMA EXCISION  01/2017  ? nose  ? TOOTH EXTRACTION     ? TOTAL HIP ARTHROPLASTY  06/2016  ? ? ?Family History  ?Problem Relation Age of Onset  ? Asthma Mother   ? Arthritis Mother   ? Skin cancer Mother   ?     basal cell carcinoma  ? Kidney failure Mother   ? Atrial fibrillation Mother   ? Asthma Father   ? Arthritis Father   ? Diabetes Father   ? Heart attack Father   ? Hyperlipidemia Father   ? Hypertension Father   ? Kidney disease Father   ? Diabetes Brother   ? Arthritis Brother   ? Hyperlipidemia Brother   ? Brain cancer Brother   ?     GBM  ? Alcohol abuse Brother   ? Breast cancer Maternal Grandmother   ? Diabetes Paternal Grandfather   ? Colon cancer Neg Hx   ? Esophageal cancer Neg Hx   ? Liver cancer Neg Hx   ? Pancreatic cancer Neg Hx   ? Rectal cancer Neg Hx   ? Stomach cancer Neg Hx   ? ? ?Social  History  ? ?Socioeconomic History  ? Marital status: Married  ?  Spouse name: Not on file  ? Number of children: 3  ? Years of education: Not on file  ? Highest education level: Not on file  ?Occupational History  ? Not on file  ?Tobacco Use  ? Smoking status: Never  ? Smokeless tobacco: Never  ?Vaping Use  ? Vaping Use: Never used  ?Substance and Sexual Activity  ? Alcohol use: Yes  ?  Alcohol/week: 1.0 standard drink  ?  Types: 1 Glasses of wine per week  ? Drug use: No  ? Sexual activity: Not on file  ?Other Topics Concern  ? Not on file  ?Social History Narrative  ? Former Lawyer at [employer], now Optician, dispensing.   ? Married  ? 3 daughters- all live locally  ? Has 7 grandchildren and 1 great grandchild  ? Enjoys reading, photographer  ?   ?   ? Lives in a one story home with a bonus room over the garage.  ? ?Social Determinants of Health  ? ?Financial Resource Strain: Not on file  ?Food Insecurity: Not on file  ?Transportation Needs: Not on file  ?Physical Activity: Not on file  ?Stress: Not on file  ?Social Connections: Not on file  ?Intimate Partner Violence: Not on file  ? ? ?Outpatient Medications Prior to Visit  ?Medication Sig  Dispense Refill  ? albuterol (VENTOLIN HFA) 108 (90 Base) MCG/ACT inhaler Inhale 2 puffs into the lungs every 6 (six) hours as needed for wheezing or shortness of breath. 18 g 1  ? allopurinol (ZYLOPRIM) 100 MG tablet TAKE 1 TABLET(100 MG) BY MOUTH TWICE DAILY 180 tablet 1  ? calcium carbonate (OS-CAL - DOSED IN MG OF ELEMENTAL CALCIUM) 1250 (500 Ca) MG tablet Take 1 tablet by mouth daily.    ? Cholecalciferol (VITAMIN D3) 5000 units CAPS Take by mouth.    ? clotrimazole-betamethasone (LOTRISONE) cream Apply 1 application topically 2 (two) times daily. 30 g 0  ? docusate sodium (COLACE) 100 MG capsule Take 200 mg by mouth daily.     ? EPINEPHrine 0.3 mg/0.3 mL IJ SOAJ injection Use as directed for severe allergic reactions 4 each 3  ? fexofenadine (ALLEGRA) 180 MG tablet Take 180 mg by mouth daily.     ? Fluticasone-Salmeterol,sensor, (AIRDUO DIGIHALER) 232-14 MCG/ACT AEPB Inhale 1 puff into the lungs 2 (two) times daily. 1 each 5  ? INCRUSE ELLIPTA 62.5 MCG/ACT AEPB INHALE 1 PUFF BY MOUTH DAILY 30 each 3  ? ipratropium-albuterol (DUONEB) 0.5-2.5 (3) MG/3ML SOLN 1 vial in nebulizer every 4-6 hours while awake for the next 3-5 days for coughing or wheezing. 360 mL 1  ? Magnesium 250 MG TABS Take by mouth.    ? mometasone-formoterol (DULERA) 200-5 MCG/ACT AERO INHALE 2 PUFFS BY MOUTH TWICE DAILY. RINSE, GARGLE AND SPIT OUT AFTER USE 13 g 0  ? montelukast (SINGULAIR) 10 MG tablet Take one tablet once at night for coughing or wheezing 90 tablet 1  ? tiZANidine (ZANAFLEX) 2 MG tablet Take 1-2 tablets (2-4 mg total) by mouth at bedtime as needed for muscle spasms. 60 tablet 1  ? amLODipine (NORVASC) 5 MG tablet TAKE 1 TABLET(5 MG) BY MOUTH DAILY 90 tablet 1  ? beclomethasone (QVAR REDIHALER) 80 MCG/ACT inhaler For asthma flare, begin Qvar 80-2 puffs twice a day with a spacer and call the clinic 1 each 0  ? COVID-19 mRNA bivalent vaccine, Pfizer, injection Inject into  the muscle. 0.3 mL 0  ? levothyroxine (SYNTHROID) 75  MCG tablet TAKE 1 TABLET(75 MCG) BY MOUTH DAILY BEFORE BREAKFAST 90 tablet 1  ? ?Facility-Administered Medications Prior to Visit  ?Medication Dose Route Frequency Provider Last Rate Last Admin  ? 0.9 %  sodium chloride infusion  500 mL Intravenous Once Gatha Mayer, MD      ? Maryjean Ka SOSY 30 mg  30 mg Subcutaneous Q8 Weeks Bobbitt, [place] Muta, MD   30 mg at 07/12/19 1546  ? ? ?Allergies  ?Allergen Reactions  ? Baclofen Shortness Of Breath  ?  Asthma exacerbation  ? Colchicine Other (See Comments)  ?  Pancreatitis  ? Doxycycline Hives  ? Statins Other (See Comments)  ?  Joint stiffness  ? Avelox [Moxifloxacin Hcl In Nacl] Other (See Comments)  ?  SEIZURE  ? Bactrim [Sulfamethoxazole-Trimethoprim] Hives  ? Cefdinir Hives  ? Losartan   ?  Rash ?  ? Oregano [Origanum Oil] Cough  ? Peanut-Containing Drug Products Swelling  ?  Throat swelling  ? Adhesive  [Tape] Other (See Comments)  ?  "tears skin"  ? Penicillins Rash  ? ? ?ROS ? ?   ?Objective:  ?  ?Physical Exam ?Constitutional:   ?   General: He is not in acute distress. ?   Appearance: He is well-developed.  ?HENT:  ?   Head: Normocephalic and atraumatic.  ?Cardiovascular:  ?   Rate and Rhythm: Normal rate and regular rhythm.  ?   Heart sounds: No murmur heard. ?Pulmonary:  ?   Effort: Pulmonary effort is normal. No respiratory distress.  ?   Breath sounds: Decreased air movement present. Decreased breath sounds present. No wheezing or rales.  ?Skin: ?   General: Skin is warm and dry.  ?Neurological:  ?   Mental Status: He is alert and oriented to person, place, and time.  ?Psychiatric:     ?   Behavior: Behavior normal.     ?   Thought Content: Thought content normal.  ? ? ?BP 135/82   Pulse (!) 57   Resp 18   Ht '5\' 6"'$  (1.676 m)   Wt 217 lb (98.4 kg)   SpO2 99%   BMI 35.02 kg/m?  ?Wt Readings from Last 3 Encounters:  ?07/14/21 217 lb (98.4 kg)  ?02/12/21 210 lb (95.3 kg)  ?02/10/21 212 lb 6.4 oz (96.3 kg)  ? ? ?   ?Assessment & Plan:  ? ?Problem  List Items Addressed This Visit   ? ?  ? Unprioritized  ? Severe persistent asthma  ?  Uncontrolled. Will give prednisone taper. Refer to pulmonology. ?  ?  ? Relevant Medications  ? predniSONE (DELTASONE) 10 MG

## 2021-07-14 NOTE — Assessment & Plan Note (Signed)
Repeat PSA 

## 2021-07-14 NOTE — Assessment & Plan Note (Signed)
Clinically stable. Continue CPAP. Will refer to sleep specialist for ongoing management.  ?

## 2021-07-14 NOTE — Patient Instructions (Signed)
Please complete lab work prior to leaving.   

## 2021-07-31 ENCOUNTER — Other Ambulatory Visit: Payer: Self-pay | Admitting: Family Medicine

## 2021-08-13 ENCOUNTER — Ambulatory Visit (INDEPENDENT_AMBULATORY_CARE_PROVIDER_SITE_OTHER): Payer: HMO

## 2021-08-13 VITALS — Ht 66.0 in | Wt 214.0 lb

## 2021-08-13 DIAGNOSIS — Z0000 Encounter for general adult medical examination without abnormal findings: Secondary | ICD-10-CM | POA: Diagnosis not present

## 2021-08-13 NOTE — Progress Notes (Signed)
? ?Subjective:  ? Michael [name] is a 71 y.o. male who presents for an Initial Medicare Annual Wellness Visit. ?Virtual Visit via Telephone Note ? ?I connected with  Michael [name] on 08/13/21 at  3:30 PM EDT by telephone and verified that I am speaking with the correct person using two identifiers. ? ?Location: ?Patient: HOME ?Provider: [facility] ?Persons participating in the virtual visit: patient/Nurse Health Advisor ?  ?I discussed the limitations, risks, security and privacy concerns of performing an evaluation and management service by telephone and the availability of in person appointments. The patient expressed understanding and agreed to proceed. ? ?Interactive audio and video telecommunications were attempted between this nurse and patient, however failed, due to patient having technical difficulties OR patient did not have access to video capability.  We continued and completed visit with audio only. ? ?Some vital signs may be absent or patient reported.  ? ?Michael Driver, LPN ? ?Review of Systems    ? ?Cardiac Risk Factors include: advanced age (>24mn, >>22women);dyslipidemia;hypertension;male gender;sedentary lifestyle;obesity (BMI >30kg/m2) ? ?   ?Objective:  ?  ?Today's Vitals  ? 08/13/21 1530  ?Weight: 214 lb (97.1 kg)  ?Height: '5\' 6"'$  (1.676 m)  ? ?Body mass index is 34.54 kg/m?. ? ? ?  08/13/2021  ?  3:42 PM 11/16/2020  ?  9:04 AM 01/01/2020  ?  8:03 AM 09/25/2019  ?  8:02 AM 06/09/2018  ?  9:04 PM  ?Advanced Directives  ?Does Patient Have a Medical Advance Directive? Yes Yes Yes Yes No  ?Type of [place];Out of facility DNR (pink MOST or yellow form) [place]    ?Does patient want to make changes to medical advance directive?   No - Patient declined No - Patient declined   ?Copy of [place]ates[place] Chart? No - copy requested  No - copy requested     ? ? ?Current Medications (verified) ?Outpatient Encounter Medications as of 08/13/2021  ?Medication Sig  ? albuterol (VENTOLIN HFA) 108 (90 Base) MCG/ACT inhaler Inhale 2 puffs into the lungs every 6 (six) hours as needed for wheezing or shortness of breath.  ? allopurinol (ZYLOPRIM) 100 MG tablet TAKE 1 TABLET(100 MG) BY MOUTH TWICE DAILY  ? amLODipine (NORVASC) 5 MG tablet TAKE 1 TABLET(5 MG) BY MOUTH DAILY  ? calcium carbonate (OS-CAL - DOSED IN MG OF ELEMENTAL CALCIUM) 1250 (500 Ca) MG tablet Take 1 tablet by mouth daily.  ? Cholecalciferol (VITAMIN D3) 5000 units CAPS Take by mouth.  ? clotrimazole-betamethasone (LOTRISONE) cream Apply 1 application topically 2 (two) times daily.  ? docusate sodium (COLACE) 100 MG capsule Take 200 mg by mouth daily.   ? EPINEPHrine 0.3 mg/0.3 mL IJ SOAJ injection Use as directed for severe allergic reactions  ? fexofenadine (ALLEGRA) 180 MG tablet Take 180 mg by mouth daily.   ? Fluticasone-Salmeterol,sensor, (AIRDUO DIGIHALER) 232-14 MCG/ACT AEPB Inhale 1 puff into the lungs 2 (two) times daily.  ? INCRUSE ELLIPTA 62.5 MCG/ACT AEPB INHALE 1 PUFF BY MOUTH DAILY  ? ipratropium-albuterol (DUONEB) 0.5-2.5 (3) MG/3ML SOLN 1 vial in nebulizer every 4-6 hours while awake for the next 3-5 days for coughing or wheezing.  ? levothyroxine (SYNTHROID) 75 MCG tablet TAKE 1 TABLET(75 MCG) BY MOUTH DAILY BEFORE BREAKFAST  ? Magnesium 250 MG TABS Take by mouth.  ? mometasone-formoterol (DULERA) 200-5 MCG/ACT AERO INHALE 2 PUFFS BY MOUTH TWICE DAILY.  RINSE, GARGLE AND SPIT OUT AFTER USE  ? montelukast (SINGULAIR) 10 MG tablet Take one tablet once at night for coughing or wheezing  ? predniSONE (DELTASONE) 10 MG tablet 4 tabs by mouth once daily for 3 days, then 3 tabs daily x 3 days, then 2 tabs daily x 3 days, then 1 tab daily x 3 days  ? rOPINIRole (REQUIP) 0.5 MG tablet Take 1 tablet (0.5 mg total) by mouth at bedtime.  ? tiZANidine (ZANAFLEX) 2 MG tablet Take 1-2 tablets (2-4 mg total) by  mouth at bedtime as needed for muscle spasms.  ? ?Facility-Administered Encounter Medications as of 08/13/2021  ?Medication  ? 0.9 %  sodium chloride infusion  ? Benralizumab SOSY 30 mg  ? ? ?Allergies (verified) ?Baclofen, Colchicine, Doxycycline, Statins, Avelox [moxifloxacin hcl in nacl], Bactrim [sulfamethoxazole-trimethoprim], Cefdinir, Losartan, Oregano [origanum oil], Peanut-containing drug products, Adhesive  [tape], and Penicillins  ? ?History: ?Past Medical History:  ?Diagnosis Date  ? Allergy   ? Arthritis   ? Asthma   ? Cancer [facility])   ? basal cell and squamous cell carcinoma  ? Cancer [facility])   ? prostate  ? Cataract 2023  ? Clotting disorder ([place])   ? Colon polyps   ? COPD (chronic obstructive pulmonary disease) ([place]) 1975  ? GERD (gastroesophageal reflux disease)   ? Gout   ? High cholesterol   ? History of hepatitis   ? due to drug interaction?  ? History of pancreatitis 2016  ? ?due to colchicine  ? History of pulmonary embolus (PE) 2016  ? History of stomach ulcers   ? Hypertension   ? Hypothyroidism   ? OSA (obstructive sleep apnea) 2015  ? has CPAP  ? Pneumonia   ? Sleep apnea   ? Thyroid disease   ? hypothyroid  ? Ulcer 1985  ? Urinary incontinence   ? ?Past Surgical History:  ?Procedure Laterality Date  ? BASAL CELL CARCINOMA EXCISION  2017  ? MOHS.  beneath right eye  ? [place]  ? Fractured skull in an accident  ? CHOLECYSTECTOMY  2001  ? COLON SURGERY  2001  ? Complication of gall bladder surgery  ? COLONOSCOPY    ? multiple - last 06/2017  ? COSMETIC SURGERY  2017  ? Basal cell cancer - MOHS surgery  ? HERNIA REPAIR  2001  ? HIP SURGERY    ? JOINT REPLACEMENT  06/2016  ? Total hip replacement - left hip  ? PENILE PROSTHESIS IMPLANT  2016  ? PROSTATECTOMY  2009  ? SINUS SURGERY WITH INSTATRAK  2014  ? SQUAMOUS CELL CARCINOMA EXCISION  01/2017  ? nose  ? TOOTH EXTRACTION    ? TOTAL HIP ARTHROPLASTY  06/2016  ? ?Family History  ?Problem Relation Age of Onset  ? Asthma Mother   ? Arthritis  Mother   ? Skin cancer Mother   ?     basal cell carcinoma  ? Kidney failure Mother   ? Atrial fibrillation Mother   ? Kidney disease Mother   ? Obesity Mother   ? Asthma Father   ? Arthritis Father   ? Diabetes Father   ? Heart attack Father   ? Hyperlipidemia Father   ? Hypertension Father   ? Kidney disease Father   ? Obesity Father   ? Diabetes Brother   ? Arthritis Brother   ? Hyperlipidemia Brother   ? Brain cancer Brother   ?     GBM  ?  Cancer Brother   ? Alcohol abuse Brother   ? Breast cancer Maternal Grandmother   ? Cancer Maternal Grandmother   ? Diabetes Paternal Grandfather   ? Colon cancer Neg Hx   ? Esophageal cancer Neg Hx   ? Liver cancer Neg Hx   ? Pancreatic cancer Neg Hx   ? Rectal cancer Neg Hx   ? Stomach cancer Neg Hx   ? ?Social History  ? ?Socioeconomic History  ? Marital status: Married  ?  Spouse name: Not on file  ? Number of children: 3  ? Years of education: Not on file  ? Highest education level: Not on file  ?Occupational History  ? Not on file  ?Tobacco Use  ? Smoking status: Never  ? Smokeless tobacco: Never  ?Vaping Use  ? Vaping Use: Never used  ?Substance and Sexual Activity  ? Alcohol use: Yes  ?  Alcohol/week: 3.0 standard drinks  ?  Types: 3 Glasses of wine per week  ?  Comment: Very limited  ? Drug use: No  ? Sexual activity: Yes  ?  Birth control/protection: Surgical  ?  Comment: Prostate removal  ?Other Topics Concern  ? Not on file  ?Social History Narrative  ? Former Lawyer at [employer], now Optician, dispensing.   ? Married  ? 3 daughters- all live locally  ? Has 7 grandchildren and 1 great grandchild  ? Enjoys reading, photographer  ?   ?   ? Lives in a one story home with a bonus room over the garage.  ? ?Social Determinants of Health  ? ?Financial Resource Strain: Low Risk   ? Difficulty of Paying Living Expenses: Not hard at all  ?Food Insecurity: No Food Insecurity  ? Worried About Charity fundraiser in the Last Year: Never true  ? Ran Out of Food  in the Last Year: Never true  ?Transportation Needs: No Transportation Needs  ? Lack of Transportation (Medical): No  ? Lack of Transportation (Non-Medical): No  ?Physical Activity: Sufficiently Acti

## 2021-08-13 NOTE — Patient Instructions (Signed)
[name] ,  ?Thank you for taking time to come for your Medicare Wellness Visit. I appreciate your ongoing commitment to your health goals. Please review the following plan we discussed and let me know if I can assist you in the future.  ? ?Screening recommendations/referrals: ?Colonoscopy: Done 07/10/2017 Repeat in 10 years ? ?Recommended yearly ophthalmology/optometry visit for glaucoma screening and checkup ?Recommended yearly dental visit for hygiene and checkup ? ?Vaccinations: ?Influenza vaccine: Done 01/12/2021 Repeat annually ? ?Pneumococcal vaccine: Done 11/16/2018 and 12/18/2019 ?Tdap vaccine: Done 08/15/2013 Repeat in 10 years ? ?Shingles vaccine: Done 05/14/2015, 01/19/2018 and 06/15/2018.   ?Covid-19: Done 06/06/2019, 07/01/2019, 03/03/2020, 01/20/2021 and 10/09/2020. ? ?Advanced directives: Please bring a copy of your health care power of attorney and living will to the office to be added to your chart at your convenience. ? ? ?Conditions/risks identified: KEEP UP THE GOOD WORK!! ? ?Next appointment: Follow up in one year for your annual wellness visit. 2024. ? ?Preventive Care 69 Years and Older, Male ? ?Preventive care refers to lifestyle choices and visits with your health care provider that can promote health and wellness. ?What does preventive care include? ?A yearly physical exam. This is also called an annual well check. ?Dental exams once or twice a year. ?Routine eye exams. Ask your health care provider how often you should have your eyes checked. ?Personal lifestyle choices, including: ?Daily care of your teeth and gums. ?Regular physical activity. ?Eating a healthy diet. ?Avoiding tobacco and drug use. ?Limiting alcohol use. ?Practicing safe sex. ?Taking low doses of aspirin every day. ?Taking vitamin and mineral supplements as recommended by your health care provider. ?What happens during an annual well check? ?The services and screenings done by your health care provider during your annual well  check will depend on your age, overall health, lifestyle risk factors, and family history of disease. ?Counseling  ?Your health care provider may ask you questions about your: ?Alcohol use. ?Tobacco use. ?Drug use. ?Emotional well-being. ?Home and relationship well-being. ?Sexual activity. ?Eating habits. ?History of falls. ?Memory and ability to understand (cognition). ?Work and work Statistician. ?Screening  ?You may have the following tests or measurements: ?Height, weight, and BMI. ?Blood pressure. ?Lipid and cholesterol levels. These may be checked every 5 years, or more frequently if you are over 25 years old. ?Skin check. ?Lung cancer screening. You may have this screening every year starting at age 35 if you have a 30-pack-year history of smoking and currently smoke or have quit within the past 15 years. ?Fecal occult blood test (FOBT) of the stool. You may have this test every year starting at age 42. ?Flexible sigmoidoscopy or colonoscopy. You may have a sigmoidoscopy every 5 years or a colonoscopy every 10 years starting at age 45. ?Prostate cancer screening. Recommendations will vary depending on your family history and other risks. ?Hepatitis C blood test. ?Hepatitis B blood test. ?Sexually transmitted disease (STD) testing. ?Diabetes screening. This is done by checking your blood sugar (glucose) after you have not eaten for a while (fasting). You may have this done every 1-3 years. ?Abdominal aortic aneurysm (AAA) screening. You may need this if you are a current or former smoker. ?Osteoporosis. You may be screened starting at age 75 if you are at high risk. ?Talk with your health care provider about your test results, treatment options, and if necessary, the need for more tests. ?Vaccines  ?Your health care provider may recommend certain vaccines, such as: ?Influenza vaccine. This is recommended every year. ?  Tetanus, diphtheria, and acellular pertussis (Tdap, Td) vaccine. You may need a Td booster  every 10 years. ?Zoster vaccine. You may need this after age 19. ?Pneumococcal 13-valent conjugate (PCV13) vaccine. One dose is recommended after age 4. ?Pneumococcal polysaccharide (PPSV23) vaccine. One dose is recommended after age 19. ?Talk to your health care provider about which screenings and vaccines you need and how often you need them. ?This information is not intended to replace advice given to you by your health care provider. Make sure you discuss any questions you have with your health care provider. ?Document Released: 05/08/2015 Document Revised: 12/30/2015 Document Reviewed: 02/10/2015 ?Elsevier Interactive Patient Education ? 2017 [employer]. ? ?Fall Prevention in the Home ?Falls can cause injuries. They can happen to people of all ages. There are many things you can do to make your home safe and to help prevent falls. ?What can I do on the outside of my home? ?Regularly fix the edges of walkways and driveways and fix any cracks. ?Remove anything that might make you trip as you walk through a door, such as a raised step or threshold. ?Trim any bushes or trees on the path to your home. ?Use bright outdoor lighting. ?Clear any walking paths of anything that might make someone trip, such as rocks or tools. ?Regularly check to see if handrails are loose or broken. Make sure that both sides of any steps have handrails. ?Any raised decks and porches should have guardrails on the edges. ?Have any leaves, snow, or ice cleared regularly. ?Use sand or salt on walking paths during winter. ?Clean up any spills in your garage right away. This includes oil or grease spills. ?What can I do in the bathroom? ?Use night lights. ?Install grab bars by the toilet and in the tub and shower. Do not use towel bars as grab bars. ?Use non-skid mats or decals in the tub or shower. ?If you need to sit down in the shower, use a plastic, non-slip stool. ?Keep the floor dry. Clean up any water that spills on the floor as soon  as it happens. ?Remove soap buildup in the tub or shower regularly. ?Attach bath mats securely with double-sided non-slip rug tape. ?Do not have throw rugs and other things on the floor that can make you trip. ?What can I do in the bedroom? ?Use night lights. ?Make sure that you have a light by your bed that is easy to reach. ?Do not use any sheets or blankets that are too big for your bed. They should not hang down onto the floor. ?Have a firm chair that has side arms. You can use this for support while you get dressed. ?Do not have throw rugs and other things on the floor that can make you trip. ?What can I do in the kitchen? ?Clean up any spills right away. ?Avoid walking on wet floors. ?Keep items that you use a lot in easy-to-reach places. ?If you need to reach something above you, use a strong step stool that has a grab bar. ?Keep electrical cords out of the way. ?Do not use floor polish or wax that makes floors slippery. If you must use wax, use non-skid floor wax. ?Do not have throw rugs and other things on the floor that can make you trip. ?What can I do with my stairs? ?Do not leave any items on the stairs. ?Make sure that there are handrails on both sides of the stairs and use them. Fix handrails that are broken or loose.  Make sure that handrails are as long as the stairways. ?Check any carpeting to make sure that it is firmly attached to the stairs. Fix any carpet that is loose or worn. ?Avoid having throw rugs at the top or bottom of the stairs. If you do have throw rugs, attach them to the floor with carpet tape. ?Make sure that you have a light switch at the top of the stairs and the bottom of the stairs. If you do not have them, ask someone to add them for you. ?What else can I do to help prevent falls? ?Wear shoes that: ?Do not have high heels. ?Have rubber bottoms. ?Are comfortable and fit you well. ?Are closed at the toe. Do not wear sandals. ?If you use a stepladder: ?Make sure that it is fully  opened. Do not climb a closed stepladder. ?Make sure that both sides of the stepladder are locked into place. ?Ask someone to hold it for you, if possible. ?Clearly mark and make sure that you can see: ?Any gr

## 2021-08-17 ENCOUNTER — Encounter: Payer: Self-pay | Admitting: Pulmonary Disease

## 2021-08-17 ENCOUNTER — Telehealth: Payer: Self-pay | Admitting: Pulmonary Disease

## 2021-08-17 ENCOUNTER — Ambulatory Visit: Payer: HMO | Admitting: Pulmonary Disease

## 2021-08-17 VITALS — BP 128/80 | HR 59 | Ht 66.0 in | Wt 218.8 lb

## 2021-08-17 DIAGNOSIS — J4550 Severe persistent asthma, uncomplicated: Secondary | ICD-10-CM

## 2021-08-17 MED ORDER — EPINEPHRINE 0.3 MG/0.3ML IJ SOAJ: INTRAMUSCULAR | 2 refills | Status: DC

## 2021-08-17 MED ORDER — IPRATROPIUM-ALBUTEROL 0.5-2.5 (3) MG/3ML IN SOLN: RESPIRATORY_TRACT | 1 refills | Status: DC

## 2021-08-17 MED ORDER — MONTELUKAST SODIUM 10 MG PO TABS: ORAL_TABLET | ORAL | 3 refills | Status: DC

## 2021-08-17 MED ORDER — FLUTICASONE FUROATE-VILANTEROL 200-25 MCG/ACT IN AEPB: 1.0000 | INHALATION_SPRAY | Freq: Every day | RESPIRATORY_TRACT | 5 refills | Status: DC

## 2021-08-17 MED ORDER — PREDNISONE 10 MG PO TABS: ORAL_TABLET | ORAL | 0 refills | Status: AC

## 2021-08-17 MED ORDER — INCRUSE ELLIPTA 62.5 MCG/ACT IN AEPB: 1.0000 | INHALATION_SPRAY | Freq: Every day | RESPIRATORY_TRACT | 11 refills | Status: DC

## 2021-08-17 NOTE — Telephone Encounter (Signed)
Please enroll patient for Dupixent  ?  ?Patient previously on [place]. Not covered by insurance so has not been on for >1 year. Now has new insurance coverage and needing to restart Dupixent.  ?

## 2021-08-17 NOTE — Progress Notes (Signed)
? ? ?Subjective:  ? ?PATIENT ID: Michael [name] GENDER: male DOB: 14-Mar-1951, MRN: [id number] ? ? ?HPI ? ?Chief Complaint  ?Patient presents with  ? Follow-up  ?  Asthma   ? ? ?Reason for Visit: Follow-up ? ?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 ? ?Synopsis: ?He is a [place] resident however travels often for his work.  He is a Optometrist in healthcare, transitioning hospital systems to a "cloud". He travels frequently for work. In December 2019 he went on a cruise and stayed in [place] during January-March 2020. In January he had a severe respiratory illness after his cruise manifested as coughing, wheezing and shortness of breath that required nebulizer use 3-4 times a day.  He did not require hospitalization.   ? ?His asthma has been previously managed by Dr. Verlin Fester an allergist.  Previously on [place], [place] and [place]. Symptoms aggravated by grass and trees. He enjoys gardening  ? ?08/17/21 ?He was last seen in clinic with me in 10/2018. Since then he has been seen in Allergy clinic for management of asthma. Previously on [place] and [place] however discontinued due to insurance. Also has tried Xolair in the past without improvement. Has ran out of his [place] and currently only on Incruse and montelukast. Symptoms uncontrolled with shortness of breath and cough. Has recently been treated for exacerbation one month ago with prednisone taper. ? ?Social History: ?Never smoker ? ?Environmental exposures: Travel exposure to high risk COVID areas prior to 2020 pandemic.  No formal diagnosis of COVID ? ? ?Past Medical History:  ?Diagnosis Date  ? Allergy   ? Arthritis   ? Asthma   ? Cancer [facility])   ? basal cell and squamous cell carcinoma  ? Cancer [facility])   ? prostate  ? Cataract 2023  ? Clotting  disorder ([place])   ? Colon polyps   ? COPD (chronic obstructive pulmonary disease) ([place]) 1975  ? GERD (gastroesophageal reflux disease)   ? Gout   ? High cholesterol   ? History of hepatitis   ? due to drug interaction?  ? History of pancreatitis 2016  ? ?due to colchicine  ? History of pulmonary embolus (PE) 2016  ? History of stomach ulcers   ? Hypertension   ? Hypothyroidism   ? OSA (obstructive sleep apnea) 2015  ? has CPAP  ? Pneumonia   ? Sleep apnea   ? Thyroid disease   ? hypothyroid  ? Ulcer 1985  ? Urinary incontinence   ?  ? ?Family History  ?Problem Relation Age of Onset  ? Asthma Mother   ? Arthritis Mother   ? Skin cancer Mother   ?     basal cell carcinoma  ? Kidney failure Mother   ? Atrial fibrillation Mother   ? Kidney disease Mother   ? Obesity Mother   ? Asthma Father   ? Arthritis Father   ? Diabetes Father   ? Heart attack Father   ? Hyperlipidemia Father   ? Hypertension Father   ? Kidney disease Father   ? Obesity Father   ? Diabetes Brother   ? Arthritis Brother   ? Hyperlipidemia Brother   ? Brain cancer Brother   ?     GBM  ? Cancer Brother   ? Alcohol abuse Brother   ? Breast  cancer Maternal Grandmother   ? Cancer Maternal Grandmother   ? Diabetes Paternal Grandfather   ? Colon cancer Neg Hx   ? Esophageal cancer Neg Hx   ? Liver cancer Neg Hx   ? Pancreatic cancer Neg Hx   ? Rectal cancer Neg Hx   ? Stomach cancer Neg Hx   ?  ? ?Social History  ? ?Occupational History  ? Not on file  ?Tobacco Use  ? Smoking status: Never  ? Smokeless tobacco: Never  ?Vaping Use  ? Vaping Use: Never used  ?Substance and Sexual Activity  ? Alcohol use: Yes  ?  Alcohol/week: 3.0 standard drinks  ?  Types: 3 Glasses of wine per week  ?  Comment: Very limited  ? Drug use: No  ? Sexual activity: Yes  ?  Birth control/protection: Surgical  ?  Comment: Prostate removal  ? ? ?Allergies  ?Allergen Reactions  ? Baclofen Shortness Of Breath  ?  Asthma exacerbation  ? Colchicine Other (See Comments)  ?  Pancreatitis  ?  Doxycycline Hives  ? Statins Other (See Comments)  ?  Joint stiffness  ? Avelox [Moxifloxacin Hcl In Nacl] Other (See Comments)  ?  SEIZURE  ? Bactrim [Sulfamethoxazole-Trimethoprim] Hives  ? Cefdinir Hives  ? Losartan   ?  Rash ?  ? Oregano [[place] Oil] Cough  ? Peanut-Containing Drug Products Swelling  ?  Throat swelling  ? Adhesive  [Tape] Other (See Comments)  ?  "tears skin"  ? Penicillins Rash  ?  ? ?Outpatient Medications Prior to Visit  ?Medication Sig Dispense Refill  ? albuterol (VENTOLIN HFA) 108 (90 Base) MCG/ACT inhaler Inhale 2 puffs into the lungs every 6 (six) hours as needed for wheezing or shortness of breath. 18 g 1  ? allopurinol (ZYLOPRIM) 100 MG tablet TAKE 1 TABLET(100 MG) BY MOUTH TWICE DAILY 180 tablet 1  ? amLODipine (NORVASC) 5 MG tablet TAKE 1 TABLET(5 MG) BY MOUTH DAILY 90 tablet 1  ? calcium carbonate (OS-CAL - DOSED IN MG OF ELEMENTAL CALCIUM) 1250 (500 Ca) MG tablet Take 1 tablet by mouth daily.    ? Cholecalciferol (VITAMIN D3) 5000 units CAPS Take by mouth.    ? docusate sodium (COLACE) 100 MG capsule Take 200 mg by mouth daily.     ? fexofenadine (ALLEGRA) 180 MG tablet Take 180 mg by mouth daily.     ? INCRUSE ELLIPTA 62.5 MCG/ACT AEPB INHALE 1 PUFF BY MOUTH DAILY 30 each 1  ? ipratropium-albuterol (DUONEB) 0.5-2.5 (3) MG/3ML SOLN 1 vial in nebulizer every 4-6 hours while awake for the next 3-5 days for coughing or wheezing. 360 mL 1  ? levothyroxine (SYNTHROID) 75 MCG tablet TAKE 1 TABLET(75 MCG) BY MOUTH DAILY BEFORE BREAKFAST 90 tablet 1  ? Magnesium 250 MG TABS Take by mouth.    ? montelukast (SINGULAIR) 10 MG tablet Take one tablet once at night for coughing or wheezing 90 tablet 1  ? clotrimazole-betamethasone (LOTRISONE) cream Apply 1 application topically 2 (two) times daily. (Patient not taking: Reported on 08/17/2021) 30 g 0  ? EPINEPHrine 0.3 mg/0.3 mL IJ SOAJ injection Use as directed for severe allergic reactions (Patient not taking: Reported on 08/17/2021) 4 each 3   ? Fluticasone-Salmeterol,sensor, (AIRDUO DIGIHALER) 232-14 MCG/ACT AEPB Inhale 1 puff into the lungs 2 (two) times daily. (Patient not taking: Reported on 08/17/2021) 1 each 5  ? mometasone-formoterol (DULERA) 200-5 MCG/ACT AERO INHALE 2 PUFFS BY MOUTH TWICE DAILY. RINSE, GARGLE AND SPIT OUT  AFTER USE (Patient not taking: Reported on 08/17/2021) 13 g 0  ? predniSONE (DELTASONE) 10 MG tablet 4 tabs by mouth once daily for 3 days, then 3 tabs daily x 3 days, then 2 tabs daily x 3 days, then 1 tab daily x 3 days (Patient not taking: Reported on 08/17/2021) 30 tablet 0  ? rOPINIRole (REQUIP) 0.5 MG tablet Take 1 tablet (0.5 mg total) by mouth at bedtime. (Patient not taking: Reported on 08/17/2021) 90 tablet 0  ? tiZANidine (ZANAFLEX) 2 MG tablet Take 1-2 tablets (2-4 mg total) by mouth at bedtime as needed for muscle spasms. (Patient not taking: Reported on 08/17/2021) 60 tablet 1  ? ?Facility-Administered Medications Prior to Visit  ?Medication Dose Route Frequency Provider Last Rate Last Admin  ? 0.9 %  sodium chloride infusion  500 mL Intravenous Once Gatha Mayer, MD      ? Maryjean Ka SOSY 30 mg  30 mg Subcutaneous Q8 Weeks Bobbitt, [place] Muta, MD   30 mg at 07/12/19 1546  ? ? ?Review of Systems  ?Constitutional:  Negative for chills, diaphoresis, fever, malaise/fatigue and weight loss.  ?HENT:  Negative for congestion.   ?Respiratory:  Positive for cough and shortness of breath. Negative for hemoptysis, sputum production and wheezing.   ?Cardiovascular:  Negative for chest pain, palpitations and leg swelling.  ? ? ?Objective:  ? ?Vitals:  ? 08/17/21 1553  ?BP: 128/80  ?Pulse: (!) 59  ?SpO2: 97%  ?Weight: 218 lb 12.8 oz (99.2 kg)  ?Height: '5\' 6"'$  (1.676 m)  ? ?SpO2: 97 % ?O2 Device: None (Room air) ? ?Physical Exam: ?General: Well-appearing, no acute distress ?HENT: [place], AT ?Eyes: EOMI, no scleral icterus ?Respiratory: Clear to auscultation bilaterally.  No crackles, wheezing or rales ?Cardiovascular: RRR, -M/R/G,  no JVD ?Extremities:-Edema,-tenderness ?Neuro: AAO x4, CNII-XII grossly intact ?Psych: Normal mood, normal affect ? ?Data Reviewed: ? ?Imaging: ?CXR 05/04/2018-low lung volumes with mild bibasilar ate

## 2021-08-17 NOTE — Patient Instructions (Addendum)
Severe persistent asthma, not an active exacerbation ?--START Breo 200-25 mcg ONE puff ONCE a day ?--REFILL Incruse ONE puff ONCE a day ?--REFILL montelukast 10 mg daily ?--REFILL Duonebs ?--REFILL Epi-Pen ?--ENROLL in Dupixent. Previously was on injections with Allergy 1-2 years ago. ?--ORDER CBC with diff and IgE  ? ?OSA on CPAP ?Machine broke down ?Last sleep study 8 years ago ?--Please obtain sleep records for [place] ? ?Follow-up with me in 1 month ?

## 2021-08-18 ENCOUNTER — Encounter: Payer: Self-pay | Admitting: Pulmonary Disease

## 2021-08-18 LAB — CBC WITH DIFFERENTIAL/PLATELET
Basophils Absolute: 0.1 10*3/uL (ref 0.0–0.1)
Basophils Relative: 0.9 % (ref 0.0–3.0)
Eosinophils Absolute: 0.4 10*3/uL (ref 0.0–0.7)
Eosinophils Relative: 5.6 % — ABNORMAL HIGH (ref 0.0–5.0)
HCT: 46.1 % (ref 39.0–52.0)
Hemoglobin: 15.6 g/dL (ref 13.0–17.0)
Lymphocytes Relative: 23.6 % (ref 12.0–46.0)
Lymphs Abs: 1.5 10*3/uL (ref 0.7–4.0)
MCHC: 33.8 g/dL (ref 30.0–36.0)
MCV: 96.2 fl (ref 78.0–100.0)
Monocytes Absolute: 0.7 10*3/uL (ref 0.1–1.0)
Monocytes Relative: 10.4 % (ref 3.0–12.0)
Neutro Abs: 3.8 10*3/uL (ref 1.4–7.7)
Neutrophils Relative %: 59.5 % (ref 43.0–77.0)
Platelets: 253 10*3/uL (ref 150.0–400.0)
RBC: 4.79 Mil/uL (ref 4.22–5.81)
RDW: 13.1 % (ref 11.5–15.5)
WBC: 6.5 10*3/uL (ref 4.0–10.5)

## 2021-08-18 LAB — IGE: IgE (Immunoglobulin E), Serum: 159 kU/L — ABNORMAL HIGH (ref <=114)

## 2021-08-18 NOTE — Telephone Encounter (Signed)
Patient had Sleep study June 2015, his AHI was 34.9 and the final CPAP pressure was 14 cm H20. ? ?Please request records from [facility] for copy of sleep study.  ? ?If unable to retrieve from [facility], may need to contact patient for his prior DME for copy of study. ?

## 2021-08-19 ENCOUNTER — Other Ambulatory Visit (HOSPITAL_COMMUNITY): Payer: Self-pay

## 2021-08-19 NOTE — Telephone Encounter (Signed)
Submitted a Prior Authorization request to  [place]  for [place] via CoverMyMeds. Will update once we receive a response. ? ?Patient is Medicare Advantage patient so may need pt assistance. ? ?Key: [id number] ? ?Knox Saliva, PharmD, MPH, BCPS ?Clinical Pharmacist (Rheumatology and Pulmonology) ?

## 2021-08-19 NOTE — Telephone Encounter (Signed)
Received notification from  [place]  regarding a prior authorization for [place]. Authorization has been APPROVED from 08/19/21 to 02/15/22.  ? ?Per test claim, copay for 28 days supply (including loading dose) is $2010.07 ? ?Authorization #  [id number] ? ?I spoke with patient regarding Dupixent approval and next step of patient assistance. He states he is still working and his household income greatly exceeds >$100,000 per year. He would not qualify for Dupixent patient assistance. He states that he came off of Dupixent because of the fluctuating copay month to month. I advised that his copay will likely decrease after this month but that I couldn't quote him a price. He states he is not interested in going through the same anxiety over cost fluctuation again and would like to defer restarting Dupixent until he retires. He anticipates retiring next year (Jan to March 2024) and will have decrease in income that may qualify him for patient assistance. ? ?He also states that he feels his asthma is controlled enough right now that he could manage without Dupixent for another year. ? ?He will reach out if his asthma significantly worsens over the course of the year and he decides he wants to start [place] and pay the copay. ? ?Knox Saliva, PharmD, MPH, BCPS ?Clinical Pharmacist (Rheumatology and Pulmonology) ? ?

## 2021-08-20 NOTE — Telephone Encounter (Signed)
Fax cover sheet will be faxed to [facility] to request a copy of the sleep study.  ?

## 2021-08-31 NOTE — Telephone Encounter (Signed)
Was sleep study received? I did not have any sleep records when I was in clinic last week ?

## 2021-09-06 NOTE — Telephone Encounter (Signed)
Looked in Dr. Cordelia Pen mailbox and did not see any records for this pt. Will route message to [facility] for f/u  ?

## 2021-09-13 ENCOUNTER — Ambulatory Visit (INDEPENDENT_AMBULATORY_CARE_PROVIDER_SITE_OTHER): Payer: HMO | Admitting: Pulmonary Disease

## 2021-09-13 ENCOUNTER — Encounter: Payer: Self-pay | Admitting: Pulmonary Disease

## 2021-09-13 VITALS — BP 132/82 | HR 59 | Temp 97.6°F | Ht 66.0 in | Wt 224.6 lb

## 2021-09-13 DIAGNOSIS — J4550 Severe persistent asthma, uncomplicated: Secondary | ICD-10-CM

## 2021-09-13 DIAGNOSIS — H6692 Otitis media, unspecified, left ear: Secondary | ICD-10-CM | POA: Diagnosis not present

## 2021-09-13 MED ORDER — AZITHROMYCIN 250 MG PO TABS: ORAL_TABLET | ORAL | 0 refills | Status: DC

## 2021-09-13 MED ORDER — PREDNISONE 10 MG PO TABS: ORAL_TABLET | ORAL | 0 refills | Status: AC

## 2021-09-13 MED ORDER — FLUTICASONE-SALMETEROL 250-50 MCG/ACT IN AEPB: 1.0000 | INHALATION_SPRAY | Freq: Two times a day (BID) | RESPIRATORY_TRACT | 5 refills | Status: DC

## 2021-09-13 NOTE — Patient Instructions (Addendum)
Acute otitis media Severe persistent asthma, in exacerbation, uncontrolled symptoms --START azithromycin --START Advair 250-50 mcg ONE puff TWICE a day --CONTINUE Incruse ONE puff ONCE a day --CONTINUE montelukast 10 mg daily --CONTINUE Duonebs --Holding on Dupixent until Jan-March 2024  Follow-up with me in 4 months

## 2021-09-13 NOTE — Progress Notes (Addendum)
Subjective:   PATIENT ID: Michael [name] GENDER: male DOB: 11-28-1950, MRN: [id number]   HPI  Chief Complaint  Patient presents with   Follow-up    Not a lot of success with Breo. Still having nocturnal asthma. Questioning sinus infection on left side. Left ear pain.    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  Synopsis: He is a [place] resident however travels often for his work.  He is a Optometrist in healthcare, transitioning hospital systems to a "cloud". He travels frequently for work. In December 2019 he went on a cruise and stayed in [place] during January-March 2020. In January he had a severe respiratory illness after his cruise manifested as coughing, wheezing and shortness of breath that required nebulizer use 3-4 times a day.  He did not require hospitalization.    His asthma has been previously managed by Dr. Verlin Fester an allergist.  Previously on Berna Bue, [place] and Nasacort. Symptoms aggravated by grass and trees. He enjoys gardening   08/17/21 He was last seen in clinic with me in 10/2018. Since then he has been seen in Allergy clinic for management of asthma. Previously on [place] and [place] however discontinued due to insurance. Also has tried Xolair in the past without improvement. Has ran out of his [place] Mannan and currently only on Incruse and montelukast. Symptoms uncontrolled with shortness of breath and cough. Has recently been treated for exacerbation one month ago with prednisone taper.  09/13/21 Since our last visit, he has continued to have symptoms. Unable to obtain Dupixent due to high co-pay. He continues to have symptoms of shortness of breath and cough on current inhalers. Also since our last visit we have made two  requests to [facility] for sleep records. Before his CPAP machine broke down he reported benefit of sleep quality when wearing his machine and now that he has not used it, he has noticed a difference in his breathing and sleep.  Asthma Control Test ACT Total Score  09/13/2021  3:25 PM 12  08/17/2021  3:52 PM 16  02/10/2021 11:00 AM 17   Social History: Never smoker  Environmental exposures: Travel exposure to high risk COVID areas prior to 2020 pandemic.  No formal diagnosis of COVID   Past Medical History:  Diagnosis Date   Allergy    Arthritis    Asthma    Cancer ([place])    basal cell and squamous cell carcinoma   Cancer (H[facility])    prostate   Cataract 2023   Clotting disorder (HCC)    Colon polyps    COPD (chronic obstructive pulmonary disease) ([place]) 1975   GERD (gastroesophageal reflux disease)    Gout    High cholesterol    History of hepatitis    due to drug interaction?   History of pancreatitis 2016   ?due to colchicine   History of pulmonary embolus ([facility]) 2016   History of stomach ulcers    Hypertension    Hypothyroidism    OSA (obstructive sleep apnea) 2015   has CPAP   Pneumonia    Sleep apnea    Thyroid disease    hypothyroid   Ulcer 1985   Urinary incontinence      Family History  Problem Relation Age of Onset   Asthma Mother  Arthritis Mother    Skin cancer Mother        basal cell carcinoma   Kidney failure Mother    Atrial fibrillation Mother    Kidney disease Mother    Obesity Mother    Asthma Father    Arthritis Father    Diabetes Father    Heart attack Father    Hyperlipidemia Father    Hypertension Father    Kidney disease Father    Obesity Father    Diabetes Brother    Arthritis Brother    Hyperlipidemia Brother    Brain cancer Brother        GBM   Cancer Brother    Alcohol abuse Brother    Breast cancer Maternal Grandmother    Cancer Maternal Grandmother    Diabetes Paternal Grandfather    Colon cancer Neg Hx     Esophageal cancer Neg Hx    Liver cancer Neg Hx    Pancreatic cancer Neg Hx    Rectal cancer Neg Hx    Stomach cancer Neg Hx      Social History   Occupational History   Not on file  Tobacco Use   Smoking status: Never   Smokeless tobacco: Never  Vaping Use   Vaping Use: Never used  Substance and Sexual Activity   Alcohol use: Yes    Alcohol/week: 3.0 standard drinks    Types: 3 Glasses of wine per week    Comment: Very limited   Drug use: No   Sexual activity: Yes    Birth control/protection: Surgical    Comment: Prostate removal    Allergies  Allergen Reactions   Baclofen Shortness Of Breath    Asthma exacerbation   Colchicine Other (See Comments)    Pancreatitis   Doxycycline Hives   Statins Other (See Comments)    Joint stiffness   Avelox [Moxifloxacin Hcl In Nacl] Other (See Comments)    SEIZURE   Bactrim [Sulfamethoxazole-Trimethoprim] Hives   Cefdinir Hives   Losartan     Rash    Oregano [Origanum Oil] Cough   Peanut-Containing Drug Products Swelling    Throat swelling   Adhesive  [Tape] Other (See Comments)    "tears skin"   Penicillins Rash     Outpatient Medications Prior to Visit  Medication Sig Dispense Refill   albuterol (VENTOLIN HFA) 108 (90 Base) MCG/ACT inhaler Inhale 2 puffs into the lungs every 6 (six) hours as needed for wheezing or shortness of breath. 18 g 1   allopurinol (ZYLOPRIM) 100 MG tablet TAKE 1 TABLET(100 MG) BY MOUTH TWICE DAILY 180 tablet 1   amLODipine (NORVASC) 5 MG tablet TAKE 1 TABLET(5 MG) BY MOUTH DAILY 90 tablet 1   calcium carbonate (OS-CAL - DOSED IN MG OF ELEMENTAL CALCIUM) 1250 (500 Ca) MG tablet Take 1 tablet by mouth daily.     Cholecalciferol (VITAMIN D3) 5000 units CAPS Take by mouth.     clotrimazole-betamethasone (LOTRISONE) cream Apply 1 application topically 2 (two) times daily. (Patient not taking: Reported on 08/17/2021) 30 g 0   docusate sodium (COLACE) 100 MG capsule Take 200 mg by mouth daily.       EPINEPHrine 0.3 mg/0.3 mL IJ SOAJ injection Use as directed for severe allergic reactions 2 each 2   fexofenadine (ALLEGRA) 180 MG tablet Take 180 mg by mouth daily.      fluticasone furoate-vilanterol (BREO ELLIPTA) 200-25 MCG/ACT AEPB Inhale 1 puff into the lungs daily. 60 each 5   ipratropium-albuterol (  DUONEB) 0.5-2.5 (3) MG/3ML SOLN 1 vial in nebulizer every 4-6 hours while awake for the next 3-5 days for coughing or wheezing. 360 mL 1   levothyroxine (SYNTHROID) 75 MCG tablet TAKE 1 TABLET(75 MCG) BY MOUTH DAILY BEFORE BREAKFAST 90 tablet 1   Magnesium 250 MG TABS Take by mouth.     montelukast (SINGULAIR) 10 MG tablet Take one tablet once at night for coughing or wheezing 90 tablet 3   rOPINIRole (REQUIP) 0.5 MG tablet Take 1 tablet (0.5 mg total) by mouth at bedtime. (Patient not taking: Reported on 08/17/2021) 90 tablet 0   tiZANidine (ZANAFLEX) 2 MG tablet Take 1-2 tablets (2-4 mg total) by mouth at bedtime as needed for muscle spasms. (Patient not taking: Reported on 08/17/2021) 60 tablet 1   umeclidinium bromide (INCRUSE ELLIPTA) 62.5 MCG/ACT AEPB Inhale 1 puff into the lungs daily. 30 each 11   No facility-administered medications prior to visit.    Review of Systems  Constitutional:  Negative for chills, diaphoresis, fever, malaise/fatigue and weight loss.  HENT:  Negative for congestion.   Respiratory:  Positive for cough and shortness of breath. Negative for hemoptysis, sputum production and wheezing.   Cardiovascular:  Negative for chest pain, palpitations and leg swelling.    Objective:   Vitals:   09/13/21 1527  BP: 132/82  Pulse: (!) 59  Temp: 97.6 F (36.4 C)  TempSrc: Oral  SpO2: 98%  Weight: 224 lb 9.6 oz (101.9 kg)  Height: '5\' 6"'$  (1.676 m)   SpO2: 98 % (RA)  Physical Exam: General: Well-appearing, no acute distress HENT: [place], AT. Right ear with slightly bulging and erythematous TM. Normal left TM Eyes: EOMI, no scleral icterus Respiratory: Diminished but  clear to auscultation bilaterally.  No crackles, wheezing or rales Cardiovascular: RRR, -M/R/G, no JVD Extremities:-Edema,-tenderness Neuro: AAO x4, CNII-XII grossly intact Psych: Normal mood, normal affect   Data Reviewed:  Imaging: CXR 05/04/2018-low lung volumes with mild bibasilar atelectasis.  No effusions, edema or infiltrates CT Chest 10/27/20 - Linear atelectasis in RML, lingular and lower lobes. Biapical scarring. RUL calcified granuloma.  PFT: 05/11/2018 FVC 2.57 (63 %) FEV1 1.53 (51 %) Ratio 60.  Postbronchodilator response with 32% change in FVC and 11% FEV1 Interpretation: Moderately severe obstructive defect with significant bronchodilator  Labs: CBC 11/16/2018 WBC 6.3 with no evidence of eosinophils with absolute eosinophil count 0.0 IgE 11/16/2018 mildly elevated 124 (upper limit 114) Imaging, labs and tests noted above have been reviewed independently by me.  CBC    Component Value Date/Time   WBC 6.5 08/17/2021 1635   RBC 4.79 08/17/2021 1635   HGB 15.6 08/17/2021 1635   HGB 15.4 02/09/2017 1629   HCT 46.1 08/17/2021 1635   HCT 45.3 02/09/2017 1629   PLT 253.0 08/17/2021 1635   PLT 340 02/09/2017 1629   MCV 96.2 08/17/2021 1635   MCV 94 02/09/2017 1629   MCH 32.5 12/18/2019 0820   MCHC 33.8 08/17/2021 1635   RDW 13.1 08/17/2021 1635   RDW 13.2 02/09/2017 1629   LYMPHSABS 1.5 08/17/2021 1635   LYMPHSABS 1.5 02/09/2017 1629   MONOABS 0.7 08/17/2021 1635   EOSABS 0.4 08/17/2021 1635   EOSABS 0.1 02/09/2017 1629   BASOSABS 0.1 08/17/2021 1635   BASOSABS 0.0 02/09/2017 1629      Assessment & Plan:   Discussion: 71 year old male never smoker with severe persistent asthma previously controlled on biologics now with uncontrolled symptoms. Has tried Xolair and Fasenra in the past. Dupixent at  the best results before however unable to continue due to high co-pay. Currently in exacerbation. Exam also consistent with left AOM.   Acute otitis media Severe  persistent asthma, in exacerbation, uncontrolled symptoms --START azithromycin --START Advair 250-50 mcg ONE puff TWICE a day --CONTINUE Incruse ONE puff ONCE a day --CONTINUE montelukast 10 mg daily --CONTINUE Duonebs --Holding on Dupixent until Jan-March 2024  OSA on CPAP Machine broke down. Reports previous benefit from usage of CPAP when it was available. Worsening SOB and sleep off machine. Last sleep study 8 years ago: [facility] 10/31/2013 however unable to review report Per OSH note: AHI was 34.9 and the final CPAP pressure was 14 cm H20. --Request records from [facility] again --Order new machine when data available  Allergic rhinitis --CONTINUE Allegra daily --CONTINUE Nasacort daily  Health Maintenance Immunization History  Administered Date(s) Administered   Fluad Quad(high Dose 65+) 01/29/2019, 07/03/2020, 01/12/2021   Influenza, High Dose Seasonal PF 01/19/2018   PFIZER Comirnaty(Gray Top)Covid-19 Tri-Sucrose Vaccine 10/09/2020   PFIZER(Purple Top)SARS-COV-2 Vaccination 06/06/2019, 07/01/2019, 03/03/2020   Pfizer Covid-19 Vaccine Bivalent Booster 12yr & up 01/20/2021   Pneumococcal Conjugate-13 08/15/2013, 11/16/2018   Pneumococcal Polysaccharide-23 11/01/2013, 12/18/2019   Tdap 08/15/2013   Zoster Recombinat (Shingrix) 01/19/2018, 06/15/2018   Zoster, Live 05/14/2015   No orders of the defined types were placed in this encounter.  Meds ordered this encounter  Medications   fluticasone-salmeterol (ADVAIR) 250-50 MCG/ACT AEPB    Sig: Inhale 1 puff into the lungs in the morning and at bedtime.    Dispense:  60 each    Refill:  5   azithromycin (ZITHROMAX) 250 MG tablet    Sig: 500 mg orally on day 1, then 250 mg orally days 2 through 5    Dispense:  6 tablet    Refill:  0   predniSONE (DELTASONE) 10 MG tablet    Sig: Take 4 tablets (40 mg total) by mouth daily with breakfast for 2 days, THEN 3 tablets (30 mg total) daily with breakfast for 2  days, THEN 2 tablets (20 mg total) daily with breakfast for 2 days, THEN 1 tablet (10 mg total) daily with breakfast for 2 days.    Dispense:  20 tablet    Refill:  0   Return in about 4 months (around 01/14/2022).  I have spent a total time of 33-minutes on the day of the appointment including chart review, data review, collecting history, coordinating care and discussing medical diagnosis and plan with the patient/family. Past medical history, allergies, medications were reviewed. Pertinent imaging, labs and tests included in this note have been reviewed and interpreted independently by me.  [place] MD [facility] 09/13/2021 3:39 PM  Office Number [phone]

## 2021-09-14 ENCOUNTER — Telehealth: Payer: Self-pay | Admitting: Pulmonary Disease

## 2021-09-14 DIAGNOSIS — G4733 Obstructive sleep apnea (adult) (pediatric): Secondary | ICD-10-CM

## 2021-09-14 NOTE — Telephone Encounter (Signed)
Per CareEverywhere: Split Night study 10/31/2013 completed at [facility] however unable to review report  Per OSH note: AHI was 34.9 and the final CPAP pressure was 14 cm H20.  Assessment/Plan  OSA --Request records from [facility] again --Please order auto CPAP 5-20cm H20 and supplies

## 2021-09-15 NOTE — Telephone Encounter (Signed)
Called and spoke with patient to make sure he wants to still use [employer]. He said yes. Order for new CPAP has been placed and I have requested copy of sleep study. Nothing further needed at this time.

## 2021-09-17 ENCOUNTER — Other Ambulatory Visit: Payer: Self-pay | Admitting: Pulmonary Disease

## 2021-09-17 DIAGNOSIS — Z23 Encounter for immunization: Secondary | ICD-10-CM | POA: Diagnosis not present

## 2021-09-21 ENCOUNTER — Telehealth: Payer: Self-pay | Admitting: Pulmonary Disease

## 2021-09-21 NOTE — Telephone Encounter (Signed)
Dr Loanne Drilling, [employer] needs last note to be amended to state notes of usage of benefit of the cpap machine for insurance requirements. Please advise once this has been completed. Thank you!

## 2021-09-22 ENCOUNTER — Encounter: Payer: Self-pay | Admitting: Pulmonary Disease

## 2021-09-22 MED ORDER — FLUTICASONE-SALMETEROL 500-50 MCG/ACT IN AEPB
1.0000 | INHALATION_SPRAY | Freq: Two times a day (BID) | RESPIRATORY_TRACT | 1 refills | Status: DC
Start: 2021-09-22 — End: 2021-12-07

## 2021-09-22 MED ORDER — PREDNISONE 10 MG PO TABS: ORAL_TABLET | ORAL | 0 refills | Status: AC

## 2021-09-22 NOTE — Telephone Encounter (Signed)
Please contact patient with the below info:  Advise to restart prednisone taper as ordered.  Also I have increased maintenance inhaler to Advair 500-50 ONE puff TWICE daily.  When symptoms are stable, we will decrease Advair to prior dose in the future

## 2021-09-22 NOTE — Telephone Encounter (Signed)
Completed.

## 2021-10-05 ENCOUNTER — Other Ambulatory Visit: Payer: Self-pay | Admitting: Family Medicine

## 2021-10-06 NOTE — Telephone Encounter (Signed)
This should go to Pulmonology, correct?

## 2021-10-08 ENCOUNTER — Other Ambulatory Visit: Payer: Self-pay

## 2021-10-08 NOTE — Telephone Encounter (Signed)
Can you please have him ask for this medication from pulmonary? They have been managing his most recent respiratory illness. Thank you

## 2021-10-08 NOTE — Telephone Encounter (Signed)
Message left for patient to return my call.  

## 2021-10-12 NOTE — Telephone Encounter (Signed)
Message left for patient to return my call.  

## 2021-10-18 ENCOUNTER — Ambulatory Visit (INDEPENDENT_AMBULATORY_CARE_PROVIDER_SITE_OTHER): Payer: HMO | Admitting: Family

## 2021-10-18 DIAGNOSIS — J4550 Severe persistent asthma, uncomplicated: Secondary | ICD-10-CM | POA: Diagnosis not present

## 2021-10-18 DIAGNOSIS — I10 Essential (primary) hypertension: Secondary | ICD-10-CM | POA: Diagnosis not present

## 2021-10-18 DIAGNOSIS — M109 Gout, unspecified: Secondary | ICD-10-CM

## 2021-10-18 DIAGNOSIS — J45909 Unspecified asthma, uncomplicated: Secondary | ICD-10-CM | POA: Diagnosis not present

## 2021-10-18 DIAGNOSIS — E039 Hypothyroidism, unspecified: Secondary | ICD-10-CM

## 2021-10-18 DIAGNOSIS — G2581 Restless legs syndrome: Secondary | ICD-10-CM

## 2021-10-18 DIAGNOSIS — M1A9XX0 Chronic gout, unspecified, without tophus (tophi): Secondary | ICD-10-CM

## 2021-10-18 DIAGNOSIS — G4733 Obstructive sleep apnea (adult) (pediatric): Secondary | ICD-10-CM

## 2021-10-18 LAB — BASIC METABOLIC PANEL
BUN: 18 mg/dL (ref 6–23)
CO2: 30 mEq/L (ref 19–32)
Calcium: 9.5 mg/dL (ref 8.4–10.5)
Chloride: 103 mEq/L (ref 96–112)
Creatinine, Ser: 1.31 mg/dL (ref 0.40–1.50)
GFR: 55.57 mL/min — ABNORMAL LOW (ref >60.00)
Glucose, Bld: 105 mg/dL — ABNORMAL HIGH (ref 70–99)
Potassium: 4.9 mEq/L (ref 3.5–5.1)
Sodium: 138 mEq/L (ref 135–145)

## 2021-10-18 LAB — TSH: TSH: 5.18 u[IU]/mL (ref 0.35–5.50)

## 2021-10-18 MED ORDER — LEVOTHYROXINE SODIUM 75 MCG PO TABS
ORAL_TABLET | ORAL | 1 refills | Status: DC
Start: 2021-10-18 — End: 2022-07-07

## 2021-10-18 MED ORDER — GABAPENTIN 300 MG PO CAPS: 300.0000 mg | ORAL_CAPSULE | Freq: Every day | ORAL | 1 refills | Status: DC

## 2021-10-18 MED ORDER — ALLOPURINOL 100 MG PO TABS: ORAL_TABLET | ORAL | 1 refills | Status: DC

## 2021-10-18 MED ORDER — AMLODIPINE BESYLATE 5 MG PO TABS
ORAL_TABLET | ORAL | 1 refills | Status: DC
Start: 2021-10-18 — End: 2022-04-21

## 2021-10-18 NOTE — Assessment & Plan Note (Signed)
Stable on allopurinol bid. Continue same.

## 2021-10-28 DIAGNOSIS — G4733 Obstructive sleep apnea (adult) (pediatric): Secondary | ICD-10-CM | POA: Diagnosis not present

## 2021-11-16 DIAGNOSIS — G4733 Obstructive sleep apnea (adult) (pediatric): Secondary | ICD-10-CM | POA: Diagnosis not present

## 2021-11-17 ENCOUNTER — Ambulatory Visit (INDEPENDENT_AMBULATORY_CARE_PROVIDER_SITE_OTHER): Payer: PPO | Admitting: Family

## 2021-11-17 VITALS — BP 140/82 | HR 60 | Temp 98.0°F | Resp 16 | Wt 228.0 lb

## 2021-11-17 DIAGNOSIS — M109 Gout, unspecified: Secondary | ICD-10-CM

## 2021-11-17 DIAGNOSIS — I10 Essential (primary) hypertension: Secondary | ICD-10-CM

## 2021-11-17 LAB — URIC ACID: Uric Acid, Serum: 5.5 mg/dL (ref 4.0–7.8)

## 2021-11-17 MED ORDER — PREDNISONE 10 MG PO TABS: ORAL_TABLET | ORAL | 0 refills | Status: DC

## 2021-11-17 NOTE — Assessment & Plan Note (Signed)
BP mildly elevated today. He is in a lot of pain which is likely contributing. Monitor.

## 2021-11-17 NOTE — Progress Notes (Signed)
Subjective:   By signing my name below, I, Luiz Ochoa, attest that this documentation has been prepared under the direction and in the presence of Debbrah Alar, NP 11/17/2021   Patient ID: Michael [name], male    DOB: 17-Nov-1950, 71 y.o.   MRN: [id number]  Chief Complaint  Patient presents with   Foot Pain    Complains of left foot pain for about 2 weeks, getting worse   Knee Pain    Complains of right knee pain for 2 weeks    HPI Patient is in today for follow up visit.  Foot Pain: A couple of weeks ago he developed pain in his left foot. He was not sure if he had sprained is foot after being on the treadmill, or if it was irritated from trying on "correction shoes." He adds that he has pain in lateral right knee. He cannot take a colchicine for his gout because it caused him pancreatitis. He adds that he has difficulty sleeping due do his foot pain.  Blood pressure: His systolic pressure was 732 mmHg earlier today, which is abnormal for him. He attributes this to the pain in his knee and foot. BP Readings from Last 3 Encounters:  11/17/21 140/82  10/18/21 123/67  09/13/21 132/82    Pulse Readings from Last 3 Encounters:  11/17/21 60  10/18/21 (!) 55  09/13/21 (!) 59     There are no preventive care reminders to display for this patient.  Past Medical History:  Diagnosis Date   Allergy    Arthritis    Asthma    Cancer ([place])    basal cell and squamous cell carcinoma   Cancer ([facility])    prostate   Cataract 2023   Clotting disorder (HCC)    Colon polyps    COPD (chronic obstructive pulmonary disease) ([place]) 1975   GERD (gastroesophageal reflux disease)    Gout    High cholesterol    History of hepatitis    due to drug interaction?   History of pancreatitis 2016   ?due to colchicine   History of pulmonary embolus (PE) 2016   History of stomach ulcers    Hypertension    Hypothyroidism    OSA (obstructive sleep apnea) 2015   has CPAP   Pneumonia     Sleep apnea    Thyroid disease    hypothyroid   Ulcer 1985   Urinary incontinence     Past Surgical History:  Procedure Laterality Date   BASAL CELL CARCINOMA EXCISION  2017   MOHS.  beneath right eye   BRAIN SURGERY  1967   Fractured skull in an accident   CHOLECYSTECTOMY  2001   COLON SURGERY  2025   Complication of gall bladder surgery   COLONOSCOPY     multiple - last 06/2017   COSMETIC SURGERY  2017   Basal cell cancer - MOHS surgery   HERNIA REPAIR  2001   HIP SURGERY     JOINT REPLACEMENT  06/2016   Total hip replacement - left hip   PENILE PROSTHESIS IMPLANT  2016   PROSTATECTOMY  2009   SINUS SURGERY WITH INSTATRAK  2014   SQUAMOUS CELL CARCINOMA EXCISION  01/2017   nose   TOOTH EXTRACTION     TOTAL HIP ARTHROPLASTY  06/2016    Family History  Problem Relation Age of Onset   Asthma Mother    Arthritis Mother    Skin cancer Mother  basal cell carcinoma   Kidney failure Mother    Atrial fibrillation Mother    Kidney disease Mother    Obesity Mother    Asthma Father    Arthritis Father    Diabetes Father    Heart attack Father    Hyperlipidemia Father    Hypertension Father    Kidney disease Father    Obesity Father    Diabetes Brother    Arthritis Brother    Hyperlipidemia Brother    Brain cancer Brother        GBM   Cancer Brother    Alcohol abuse Brother    Breast cancer Maternal Grandmother    Cancer Maternal Grandmother    Diabetes Paternal Grandfather    Colon cancer Neg Hx    Esophageal cancer Neg Hx    Liver cancer Neg Hx    Pancreatic cancer Neg Hx    Rectal cancer Neg Hx    Stomach cancer Neg Hx     Social History   Socioeconomic History   Marital status: Married    Spouse name: Not on file   Number of children: 3   Years of education: Not on file   Highest education level: Not on file  Occupational History   Not on file  Tobacco Use   Smoking status: Never   Smokeless tobacco: Never  Vaping Use   Vaping Use:  Never used  Substance and Sexual Activity   Alcohol use: Yes    Alcohol/week: 3.0 standard drinks of alcohol    Types: 3 Glasses of wine per week    Comment: Very limited   Drug use: No   Sexual activity: Yes    Birth control/protection: Surgical    Comment: Prostate removal  Other Topics Concern   Not on file  Social History Narrative   Former Lawyer at [employer], now Optician, dispensing.    Married   3 daughters- all live locally   Has 7 grandchildren and 1 great grandchild   Enjoys reading, Geophysicist/field seismologist         Lives in a one story home with a bonus room over the garage.   Social Determinants of Health   Financial Resource Strain: Low Risk  (08/13/2021)   Overall Financial Resource Strain (CARDIA)    Difficulty of Paying Living Expenses: Not hard at all  Food Insecurity: No Food Insecurity (08/13/2021)   Hunger Vital Sign    Worried About Running Out of Food in the Last Year: Never true    Ran Out of Food in the Last Year: Never true  Transportation Needs: No Transportation Needs (08/13/2021)   PRAPARE - Hydrologist (Medical): No    Lack of Transportation (Non-Medical): No  Physical Activity: Sufficiently Active (08/13/2021)   Exercise Vital Sign    Days of Exercise per Week: 5 days    Minutes of Exercise per Session: 50 min  Stress: No Stress Concern Present (08/13/2021)   [place]    Feeling of Stress : Not at all  Social Connections: [place] (08/13/2021)   Social Connection and Isolation Panel [NHANES]    Frequency of Communication with Friends and Family: More than three times a week    Frequency of Social Gatherings with Friends and Family: More than three times a week    Attends Religious Services: More than 4 times per year    Active Member of [employer] or Organizations:  Yes    Attends Club or Organization Meetings: More than 4 times per year     Marital Status: Married  Human resources officer Violence: Not At Risk (08/13/2021)   Humiliation, Afraid, Rape, and Kick questionnaire    Fear of Current or Ex-Partner: No    Emotionally Abused: No    Physically Abused: No    Sexually Abused: No    Outpatient Medications Prior to Visit  Medication Sig Dispense Refill   albuterol (VENTOLIN HFA) 108 (90 Base) MCG/ACT inhaler Inhale 2 puffs into the lungs every 6 (six) hours as needed for wheezing or shortness of breath. 18 g 1   allopurinol (ZYLOPRIM) 100 MG tablet TAKE 1 TABLET(100 MG) BY MOUTH TWICE DAILY 180 tablet 1   amLODipine (NORVASC) 5 MG tablet TAKE 1 TABLET(5 MG) BY MOUTH DAILY 90 tablet 1   calcium carbonate (OS-CAL - DOSED IN MG OF ELEMENTAL CALCIUM) 1250 (500 Ca) MG tablet Take 1 tablet by mouth daily.     Cholecalciferol (VITAMIN D3) 5000 units CAPS Take by mouth.     clotrimazole-betamethasone (LOTRISONE) cream Apply 1 application topically 2 (two) times daily. 30 g 0   docusate sodium (COLACE) 100 MG capsule Take 200 mg by mouth daily.      EPINEPHrine 0.3 mg/0.3 mL IJ SOAJ injection Use as directed for severe allergic reactions 2 each 2   fexofenadine (ALLEGRA) 180 MG tablet Take 180 mg by mouth daily.      fluticasone-salmeterol (ADVAIR) 500-50 MCG/ACT AEPB Inhale 1 puff into the lungs in the morning and at bedtime. 60 each 1   gabapentin (NEURONTIN) 300 MG capsule Take 1 capsule (300 mg total) by mouth at bedtime. 90 capsule 1   ipratropium-albuterol (DUONEB) 0.5-2.5 (3) MG/3ML SOLN 1 vial in nebulizer every 4-6 hours while awake for the next 3-5 days for coughing or wheezing. 360 mL 1   levothyroxine (SYNTHROID) 75 MCG tablet TAKE 1 TABLET(75 MCG) BY MOUTH DAILY BEFORE BREAKFAST 90 tablet 1   Magnesium 250 MG TABS Take by mouth.     montelukast (SINGULAIR) 10 MG tablet Take one tablet once at night for coughing or wheezing 90 tablet 3   tiZANidine (ZANAFLEX) 2 MG tablet Take 1-2 tablets (2-4 mg total) by mouth at bedtime as  needed for muscle spasms. 60 tablet 1   umeclidinium bromide (INCRUSE ELLIPTA) 62.5 MCG/ACT AEPB Inhale 1 puff into the lungs daily. 30 each 11   No facility-administered medications prior to visit.    Allergies  Allergen Reactions   Baclofen Shortness Of Breath    Asthma exacerbation   Colchicine Other (See Comments)    Pancreatitis   Doxycycline Hives   Statins Other (See Comments)    Joint stiffness   Avelox [Moxifloxacin Hcl In Nacl] Other (See Comments)    SEIZURE   Bactrim [Sulfamethoxazole-Trimethoprim] Hives   Cefdinir Hives   Losartan     Rash    Oregano [Origanum Oil] Cough   Peanut-Containing Drug Products Swelling    Throat swelling   Adhesive  [Tape] Other (See Comments)    "tears skin"   Penicillins Rash    Review of Systems  Musculoskeletal:  Positive for joint pain.       (+) Left Foot pain       Objective:    Physical Exam Constitutional:      Appearance: Normal appearance. He is not ill-appearing.  HENT:     Head: Normocephalic and atraumatic.     Right Ear: External ear normal.  Left Ear: External ear normal.  Eyes:     Extraocular Movements: Extraocular movements intact.     Pupils: Pupils are equal, round, and reactive to light.  Cardiovascular:     Rate and Rhythm: Normal rate and regular rhythm.     Pulses: Normal pulses.     Heart sounds: Normal heart sounds. No murmur heard.    No gallop.  Pulmonary:     Effort: Pulmonary effort is normal. No respiratory distress.     Breath sounds: Normal breath sounds. No wheezing or rales.  Musculoskeletal:     Comments: Mild swelling left dorsal foot. Tender to touch  Skin:    General: Skin is warm and dry.  Neurological:     Mental Status: He is alert and oriented to person, place, and time.  Psychiatric:        Judgment: Judgment normal.     BP 140/82   Pulse 60   Temp 98 F (36.7 C) (Oral)   Resp 16   Wt 228 lb (103.4 kg)   SpO2 97%   BMI 36.80 kg/m  Wt Readings from Last  3 Encounters:  11/17/21 228 lb (103.4 kg)  10/18/21 221 lb 9.6 oz (100.5 kg)  09/13/21 224 lb 9.6 oz (101.9 kg)       Assessment & Plan:   Problem List Items Addressed This Visit       Unprioritized   Hypertension    BP mildly elevated today. He is in a lot of pain which is likely contributing. Monitor.       Gout - Primary    Suspect gout is causing pain in left foot. He developed pancreatitis following use of colchicine in the past.  Recommended that we check a uric acid level and that he begin prednisone taper.  Continue allopurinol. He will call if pain worsens or if not improved in 2-3 days.       Relevant Medications   predniSONE (DELTASONE) 10 MG tablet   Other Relevant Orders   Uric acid     Meds ordered this encounter  Medications   predniSONE (DELTASONE) 10 MG tablet    Sig: 4 tabs by mouth once daily for 2 days, then 3 tabs daily x 2 days, then 2 tabs daily x 2 days, then 1 tab daily x 2 days    Dispense:  20 tablet    Refill:  0    Order Specific Question:   Supervising Provider    Answer:   Mosie Lukes [[id number]]    I, Nance Pear, NP, personally preformed the services described in this documentation.  All medical record entries made by the scribe were at my direction and in my presence.  I have reviewed the chart and discharge instructions (if applicable) and agree that the record reflects my personal performance and is accurate and complete. 11/17/2021   I,Tinashe Williams,acting as a scribe for Nance Pear, NP.,have documented all relevant documentation on the behalf of Nance Pear, NP,as directed by  Nance Pear, NP while in the presence of Nance Pear, NP.    Nance Pear, NP

## 2021-11-17 NOTE — Assessment & Plan Note (Signed)
Suspect gout is causing pain in left foot. He developed pancreatitis following use of colchicine in the past.  Recommended that we check a uric acid level and that he begin prednisone taper.  Continue allopurinol. He will call if pain worsens or if not improved in 2-3 days.

## 2021-11-24 ENCOUNTER — Encounter (INDEPENDENT_AMBULATORY_CARE_PROVIDER_SITE_OTHER): Payer: PPO | Admitting: Family

## 2021-11-24 DIAGNOSIS — J019 Acute sinusitis, unspecified: Secondary | ICD-10-CM

## 2021-11-24 MED ORDER — AZITHROMYCIN 250 MG PO TABS: ORAL_TABLET | ORAL | 0 refills | Status: AC

## 2021-11-24 NOTE — Telephone Encounter (Signed)

## 2021-11-28 DIAGNOSIS — G4733 Obstructive sleep apnea (adult) (pediatric): Secondary | ICD-10-CM | POA: Diagnosis not present

## 2021-12-07 ENCOUNTER — Other Ambulatory Visit: Payer: Self-pay | Admitting: Pulmonary Disease

## 2021-12-29 DIAGNOSIS — G4733 Obstructive sleep apnea (adult) (pediatric): Secondary | ICD-10-CM | POA: Diagnosis not present

## 2022-01-09 IMAGING — CT CT CHEST W/O CM
2 of 4 series · 15 of 36 positions shown, 18 images · non-contrast
Comparison: Radiograph October 21, 2020

CLINICAL DATA: Follow-up pulmonary nodule seen on prior chest
radiograph

EXAM:
CT CHEST WITHOUT CONTRAST
TECHNIQUE: Multidetector CT imaging of the chest was performed following the
standard protocol without IV contrast.

[Series 2: thorax · axial · 0.82mm/px · z∈[-334,-58]mm · 12 of 162 slices shown, 15 images]
[im 12/162  mediastinal]
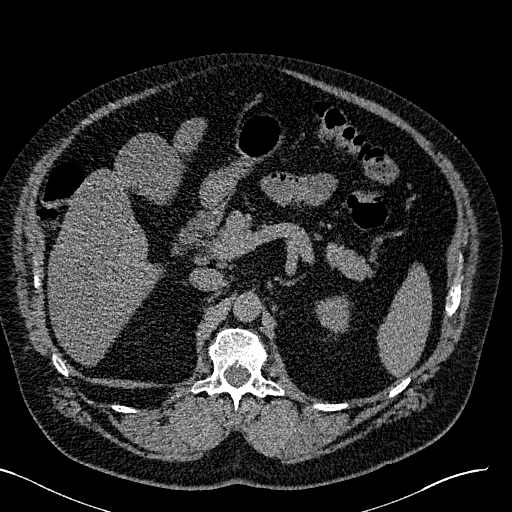
[im 12/162  lung]
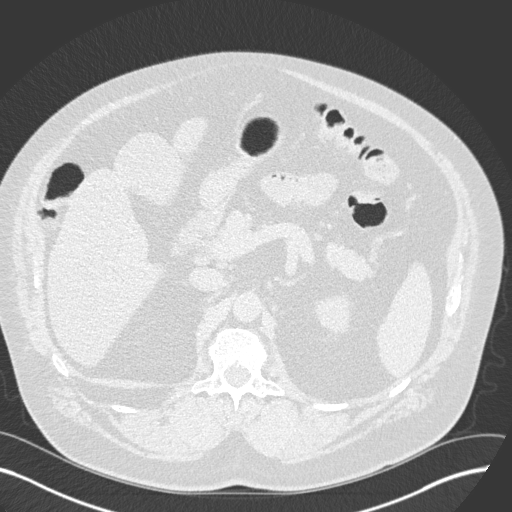
[im 24/162  lung]
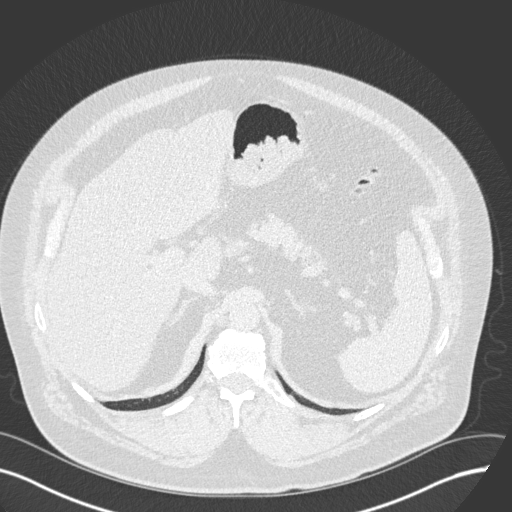
[im 35/162  lung]
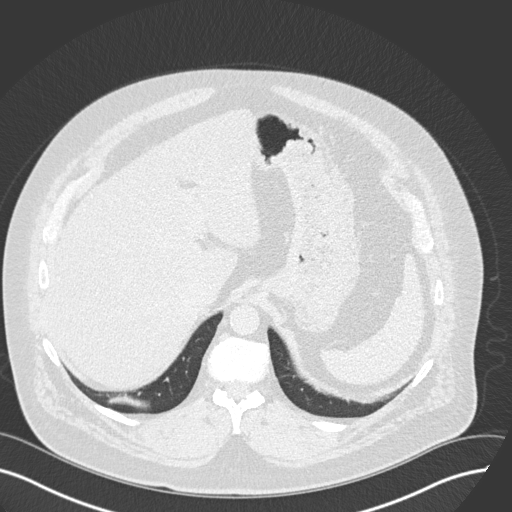
[im 47/162  lung]
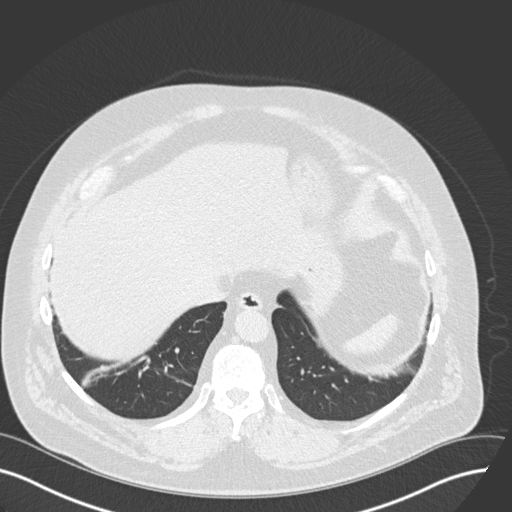
[im 58/162  mediastinal]
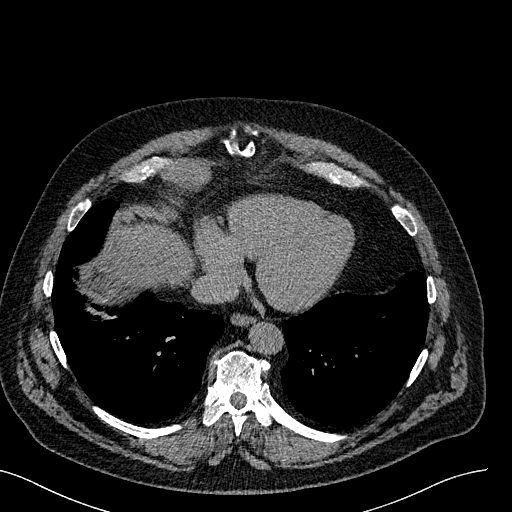
[im 58/162  lung]
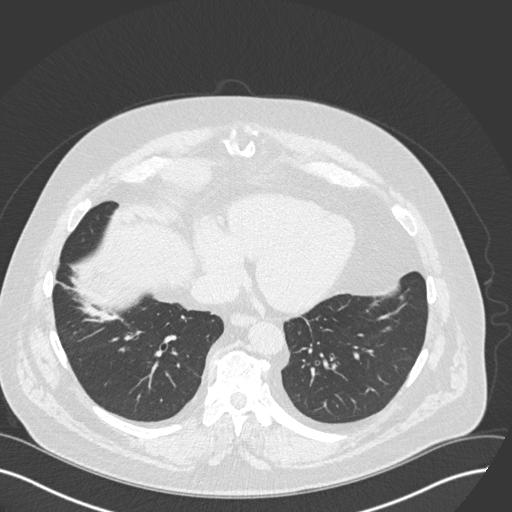
[im 70/162  lung]
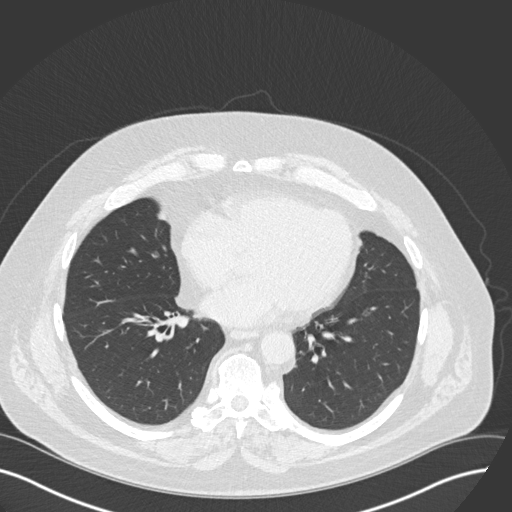
[im 93/162  lung]
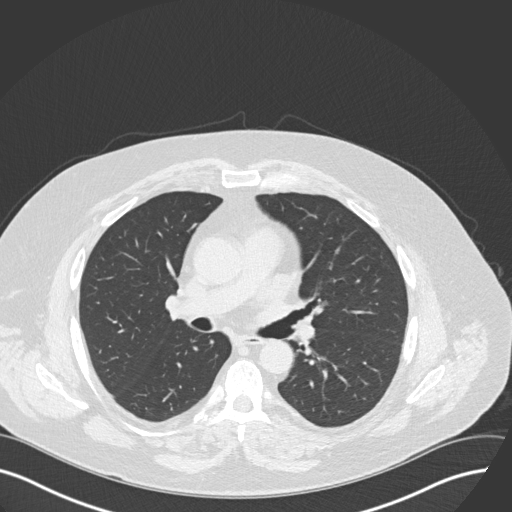
[im 104/162  lung]
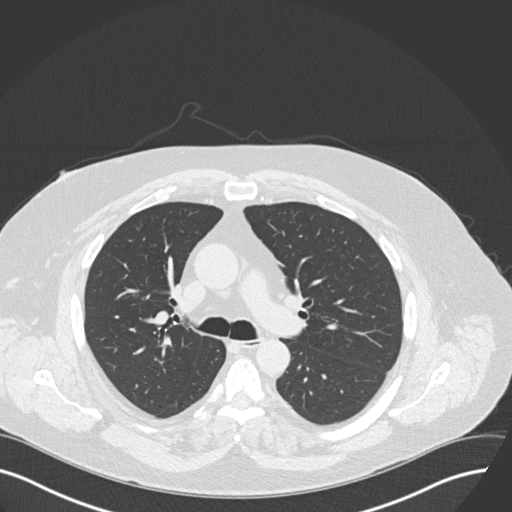
[im 116/162  mediastinal]
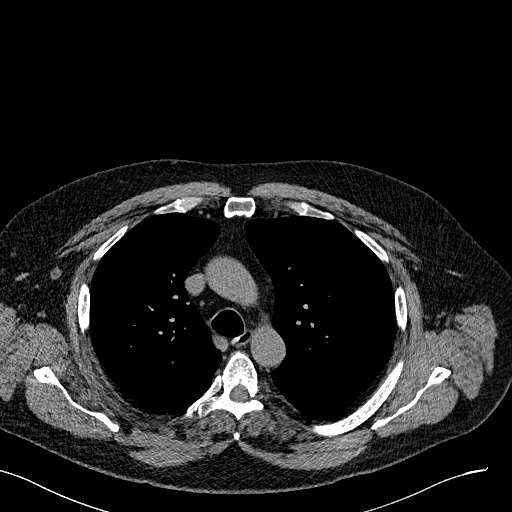
[im 116/162  lung]
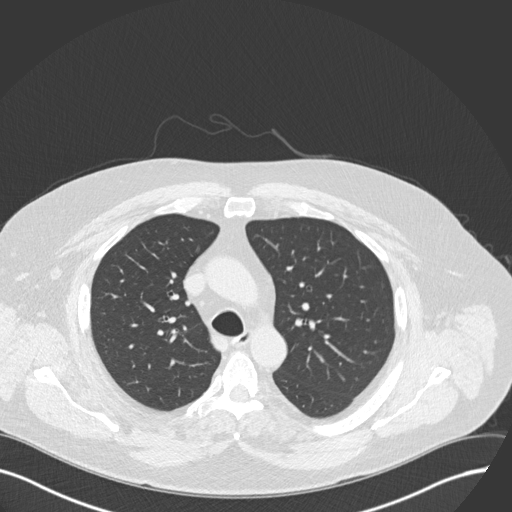
[im 127/162  lung]
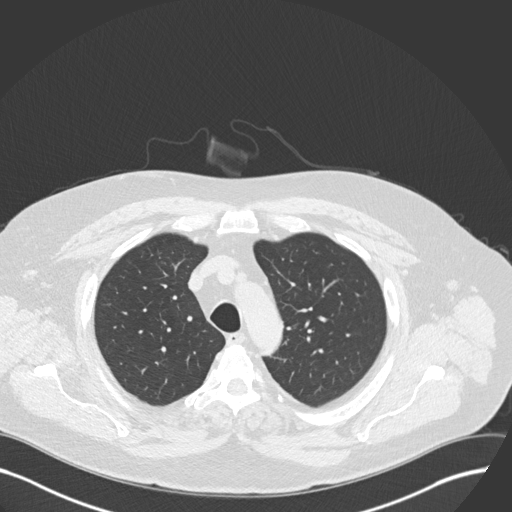
[im 139/162  lung]
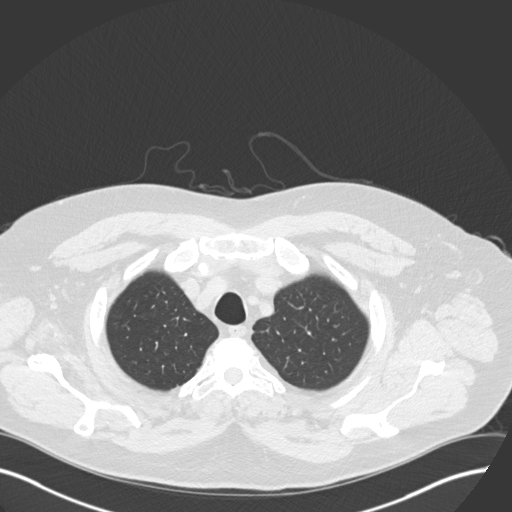
[im 150/162  lung]
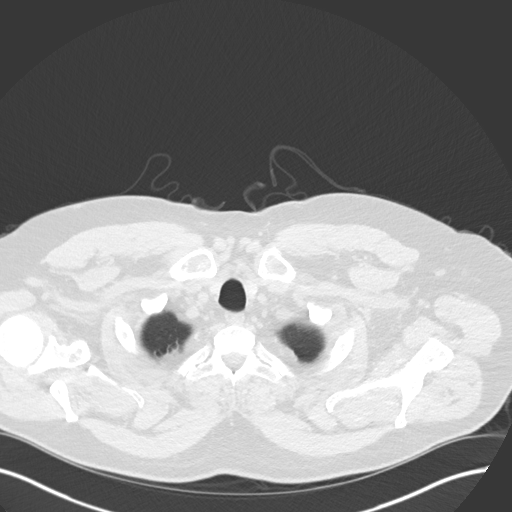

[Series 5: coronal · coronal · 0.65mm/px · 3 of 133 slices shown]
[im 27/133  lung]
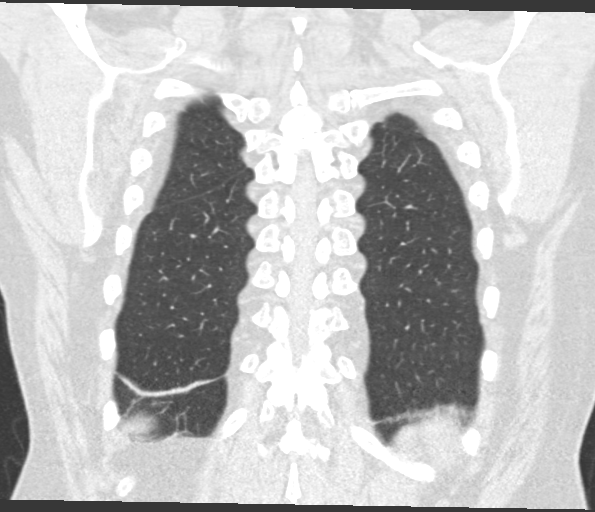
[im 53/133  lung]
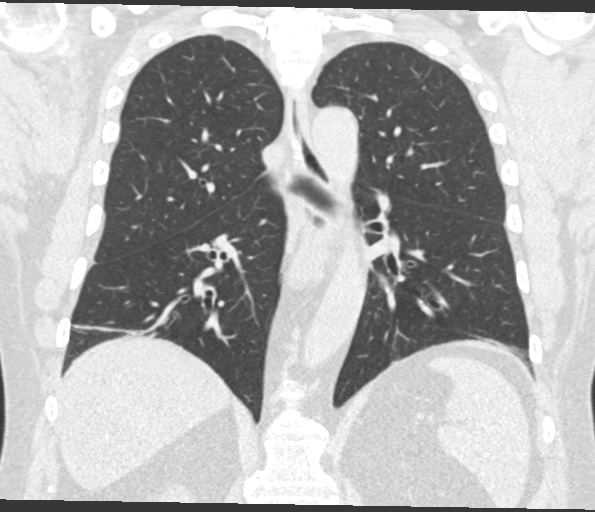
[im 80/133  lung]
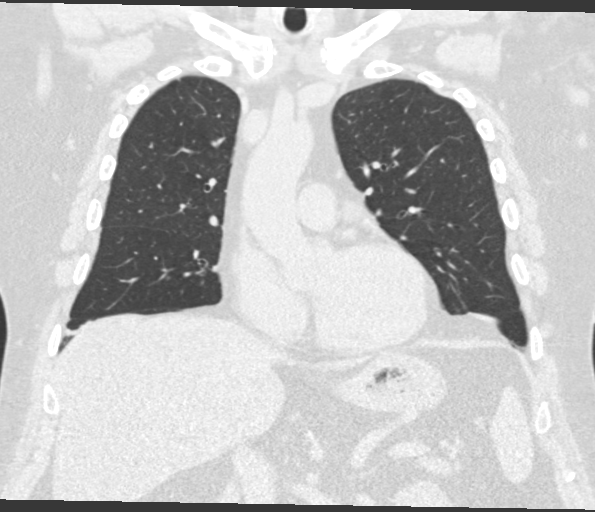

[15 of 36 positions shown; findings below may reference images not displayed]

FINDINGS: Cardiovascular: No thoracic aortic aneurysm. Normal caliber central
pulmonary arteries. Normal size heart. No significant pericardial
effusion/thickening.

Mediastinum/Nodes: No discrete thyroid nodule. No pathologically
enlarged mediastinal, hilar or axillary lymph nodes, noting limited
sensitivity for the detection of hilar adenopathy on this
noncontrast study. The trachea esophagus are grossly unremarkable.

Lungs/Pleura: Mild biapical pleuroparenchymal scarring. Calcified
granuloma in the right upper lobe on image 61/3. Linear atelectasis
in the right middle lobe, lingula and bilateral lower lobes. Mild
diffuse bronchial wall thickening. No suspicious pulmonary nodules
or masses. No pleural effusion. No pneumothorax.

Upper Abdomen: Cholecystectomy.  No acute abnormality.

Musculoskeletal: Thoracic spondylosis. No acute osseous abnormality.
IMPRESSION: 1. No suspicious pulmonary nodules or masses.
2. Mild diffuse bronchial wall thickening, which can be seen with
bronchitis.
3. Linear atelectasis in the right middle lobe, lingula, and
bilateral lower lobes.

## 2022-01-17 ENCOUNTER — Ambulatory Visit: Payer: HMO | Admitting: Pulmonary Disease

## 2022-01-28 DIAGNOSIS — G4733 Obstructive sleep apnea (adult) (pediatric): Secondary | ICD-10-CM | POA: Diagnosis not present

## 2022-02-18 ENCOUNTER — Ambulatory Visit (INDEPENDENT_AMBULATORY_CARE_PROVIDER_SITE_OTHER): Payer: PPO | Admitting: Family

## 2022-02-18 VITALS — BP 139/80 | HR 54 | Temp 98.0°F | Resp 18 | Ht 66.0 in | Wt 227.6 lb

## 2022-02-18 DIAGNOSIS — M1A9XX0 Chronic gout, unspecified, without tophus (tophi): Secondary | ICD-10-CM

## 2022-02-18 DIAGNOSIS — J4550 Severe persistent asthma, uncomplicated: Secondary | ICD-10-CM | POA: Diagnosis not present

## 2022-02-18 DIAGNOSIS — E039 Hypothyroidism, unspecified: Secondary | ICD-10-CM

## 2022-02-18 DIAGNOSIS — E782 Mixed hyperlipidemia: Secondary | ICD-10-CM | POA: Diagnosis not present

## 2022-02-18 DIAGNOSIS — I10 Essential (primary) hypertension: Secondary | ICD-10-CM

## 2022-02-18 LAB — COMPREHENSIVE METABOLIC PANEL
ALT: 17 U/L (ref 0–53)
AST: 13 U/L (ref 0–37)
Albumin: 4 g/dL (ref 3.5–5.2)
Alkaline Phosphatase: 48 U/L (ref 39–117)
BUN: 12 mg/dL (ref 6–23)
CO2: 29 mEq/L (ref 19–32)
Calcium: 9.3 mg/dL (ref 8.4–10.5)
Chloride: 103 mEq/L (ref 96–112)
Creatinine, Ser: 1.14 mg/dL (ref 0.40–1.50)
GFR: 65.5 mL/min (ref >60.00)
Glucose, Bld: 99 mg/dL (ref 70–99)
Potassium: 3.9 mEq/L (ref 3.5–5.1)
Sodium: 138 mEq/L (ref 135–145)
Total Bilirubin: 0.4 mg/dL (ref 0.2–1.2)
Total Protein: 6.1 g/dL (ref 6.0–8.3)

## 2022-02-18 LAB — TSH: TSH: 3.04 u[IU]/mL (ref 0.35–5.50)

## 2022-02-18 MED ORDER — TIZANIDINE HCL 2 MG PO TABS: 2.0000 mg | ORAL_TABLET | Freq: Every evening | ORAL | 1 refills | Status: DC | PRN

## 2022-02-18 NOTE — Assessment & Plan Note (Addendum)
BP Readings from Last 3 Encounters:  02/18/22 139/80  11/17/21 140/82  10/18/21 123/67   BP stable. Continue amlodipine '5mg'$ .  Will continue to monitor renal function.  He is concerned about low electrolytes causing cramping after exercise. Recommend that he hydrate and stretch before/after exercise.

## 2022-02-18 NOTE — Assessment & Plan Note (Signed)
Not on statin.  Lab Results  Component Value Date   CHOL 188 07/14/2021   HDL 39.90 07/14/2021   LDLCALC 121 (H) 07/14/2021   TRIG 138.0 07/14/2021   CHOLHDL 5 07/14/2021   Lipids look OK. He is statin intolerant.

## 2022-02-18 NOTE — Assessment & Plan Note (Signed)
Clinically stable on synthroid. Continue same, obtain follow up tsh.

## 2022-02-18 NOTE — Assessment & Plan Note (Addendum)
Reports asthma is stable. Maintained on [place] and singulair and followed by pulmonary.

## 2022-02-18 NOTE — Progress Notes (Signed)
Subjective:   By signing my name below, I, Michael [name], attest that this documentation has been prepared under the direction and in the presence of Michael Chimera, NP 02/18/2022   Patient ID: Michael [name], male    DOB: [birth year], 71 y.o.   MRN: [id number]  Chief Complaint  Patient presents with   Follow-up   Medication Reaction    HPI Patient is in today for an office visit  Refill: He is requesting a refill of 2 Mg of Tizanidine  Gabapentin: He reports that the 300 mg of Gabapentin gave him a bad reaction in which he developed a twitch in his right eye and worsening of his overall eyesight. He has since discontinued the medication and reports that symptoms are resolved.   GFR: He reports that his GFR levels are trending down and with the history of kidney symptoms in his family, it causes him some concern.   Electrolyte: He reports of consistent electrolyte symptoms. He states that when he is working out, his electrolyte levels are decreasing thus causing muscle spasm and cramping in his lower extremities. Symptoms are resolved when he consumes Gatorade.   Asthma: His medication was recently changed to 500-50 MCG/ACT of Wixela. He reports of no worsening asthma symptoms  Immunizations: He reports that he is UTD on RSV, Influenza and Covid vaccines  There are no preventive care reminders to display for this patient.  Past Medical History:  Diagnosis Date   Allergy    Arthritis    Asthma    Cancer ([place])    basal cell and squamous cell carcinoma   Cancer (HCC)    prostate   Cataract 2023   Clotting disorder (HCC)    Colon polyps    COPD (chronic obstructive pulmonary disease) ([place]) 1975   GERD (gastroesophageal reflux disease)    Gout    High cholesterol    History of hepatitis    due to drug interaction?   History of pancreatitis 2016   ?due to colchicine   History of pulmonary embolus (PE) 2016   History of stomach ulcers    Hypertension     Hypothyroidism    OSA (obstructive sleep apnea) 2015   has CPAP   Pneumonia    Sleep apnea    Thyroid disease    hypothyroid   Ulcer 1985   Urinary incontinence     Past Surgical History:  Procedure Laterality Date   BASAL CELL CARCINOMA EXCISION  2017   MOHS.  beneath right eye   BRAIN SURGERY  1967   Fractured skull in an accident   CHOLECYSTECTOMY  2001   COLON SURGERY  8616   Complication of gall bladder surgery   COLONOSCOPY     multiple - last 06/2017   COSMETIC SURGERY  2017   Basal cell cancer - MOHS surgery   HERNIA REPAIR  2001   HIP SURGERY     JOINT REPLACEMENT  06/2016   Total hip replacement - left hip   PENILE PROSTHESIS IMPLANT  2016   PROSTATECTOMY  2009   SINUS SURGERY WITH INSTATRAK  2014   SQUAMOUS CELL CARCINOMA EXCISION  01/2017   nose   TOOTH EXTRACTION     TOTAL HIP ARTHROPLASTY  06/2016    Family History  Problem Relation Age of Onset   Asthma Mother    Arthritis Mother    Skin cancer Mother        basal cell carcinoma   Kidney failure Mother  Atrial fibrillation Mother    Kidney disease Mother    Obesity Mother    Asthma Father    Arthritis Father    Diabetes Father    Heart attack Father    Hyperlipidemia Father    Hypertension Father    Kidney disease Father    Obesity Father    Diabetes Brother    Arthritis Brother    Hyperlipidemia Brother    Brain cancer Brother        GBM   Cancer Brother    Alcohol abuse Brother    Breast cancer Maternal Grandmother    Cancer Maternal Grandmother    Diabetes Paternal Grandfather    Colon cancer Neg Hx    Esophageal cancer Neg Hx    Liver cancer Neg Hx    Pancreatic cancer Neg Hx    Rectal cancer Neg Hx    Stomach cancer Neg Hx     Social History   Socioeconomic History   Marital status: Married    Spouse name: Not on file   Number of children: 3   Years of education: Not on file   Highest education level: Not on file  Occupational History   Not on file  Tobacco Use    Smoking status: Never   Smokeless tobacco: Never  Vaping Use   Vaping Use: Never used  Substance and Sexual Activity   Alcohol use: Yes    Alcohol/week: 3.0 standard drinks of alcohol    Types: 3 Glasses of wine per week    Comment: Very limited   Drug use: No   Sexual activity: Yes    Birth control/protection: Surgical    Comment: Prostate removal  Other Topics Concern   Not on file  Social History Narrative   Former Lawyer at [employer], now Optician, dispensing.    Married   3 daughters- all live locally   Has 7 grandchildren and 1 great grandchild   Enjoys reading, Geophysicist/field seismologist         Lives in a one story home with a bonus room over the garage.   Social Determinants of Health   Financial Resource Strain: Low Risk  (08/13/2021)   Overall Financial Resource Strain (CARDIA)    Difficulty of Paying Living Expenses: Not hard at all  Food Insecurity: No Food Insecurity (08/13/2021)   Hunger Vital Sign    Worried About Running Out of Food in the Last Year: Never true    Ran Out of Food in the Last Year: Never true  Transportation Needs: No Transportation Needs (08/13/2021)   PRAPARE - Hydrologist (Medical): No    Lack of Transportation (Non-Medical): No  Physical Activity: Sufficiently Active (08/13/2021)   Exercise Vital Sign    Days of Exercise per Week: 5 days    Minutes of Exercise per Session: 50 min  Stress: No Stress Concern Present (08/13/2021)   [place]    Feeling of Stress : Not at all  Social Connections: [place] (08/13/2021)   Social Connection and Isolation Panel [NHANES]    Frequency of Communication with Friends and Family: More than three times a week    Frequency of Social Gatherings with Friends and Family: More than three times a week    Attends Religious Services: More than 4 times per year    Active Member of [employer] or  Organizations: Yes    Attends Archivist Meetings: More  than 4 times per year    Marital Status: Married  Human resources officer Violence: Not At Risk (08/13/2021)   Humiliation, Afraid, Rape, and Kick questionnaire    Fear of Current or Ex-Partner: No    Emotionally Abused: No    Physically Abused: No    Sexually Abused: No    Outpatient Medications Prior to Visit  Medication Sig Dispense Refill   albuterol (VENTOLIN HFA) 108 (90 Base) MCG/ACT inhaler Inhale 2 puffs into the lungs every 6 (six) hours as needed for wheezing or shortness of breath. 18 g 1   allopurinol (ZYLOPRIM) 100 MG tablet TAKE 1 TABLET(100 MG) BY MOUTH TWICE DAILY 180 tablet 1   amLODipine (NORVASC) 5 MG tablet TAKE 1 TABLET(5 MG) BY MOUTH DAILY 90 tablet 1   calcium carbonate (OS-CAL - DOSED IN MG OF ELEMENTAL CALCIUM) 1250 (500 Ca) MG tablet Take 1 tablet by mouth daily.     Cholecalciferol (VITAMIN D3) 5000 units CAPS Take by mouth.     clotrimazole-betamethasone (LOTRISONE) cream Apply 1 application topically 2 (two) times daily. 30 g 0   docusate sodium (COLACE) 100 MG capsule Take 200 mg by mouth daily.      EPINEPHrine 0.3 mg/0.3 mL IJ SOAJ injection Use as directed for severe allergic reactions 2 each 2   fexofenadine (ALLEGRA) 180 MG tablet Take 180 mg by mouth daily.      ipratropium-albuterol (DUONEB) 0.5-2.5 (3) MG/3ML SOLN 1 vial in nebulizer every 4-6 hours while awake for the next 3-5 days for coughing or wheezing. 360 mL 1   levothyroxine (SYNTHROID) 75 MCG tablet TAKE 1 TABLET(75 MCG) BY MOUTH DAILY BEFORE BREAKFAST 90 tablet 1   Magnesium 250 MG TABS Take by mouth.     montelukast (SINGULAIR) 10 MG tablet Take one tablet once at night for coughing or wheezing 90 tablet 3   WIXELA INHUB 500-50 MCG/ACT AEPB INHALE 1 PUFF INTO THE LUNGS IN THE MORNING AND AT BEDTIME 60 each 1   gabapentin (NEURONTIN) 300 MG capsule Take 1 capsule (300 mg total) by mouth at bedtime. 90 capsule 1   predniSONE  (DELTASONE) 10 MG tablet 4 tabs by mouth once daily for 2 days, then 3 tabs daily x 2 days, then 2 tabs daily x 2 days, then 1 tab daily x 2 days 20 tablet 0   tiZANidine (ZANAFLEX) 2 MG tablet Take 1-2 tablets (2-4 mg total) by mouth at bedtime as needed for muscle spasms. 60 tablet 1   umeclidinium bromide (INCRUSE ELLIPTA) 62.5 MCG/ACT AEPB Inhale 1 puff into the lungs daily. 30 each 11   No facility-administered medications prior to visit.    Allergies  Allergen Reactions   Baclofen Shortness Of Breath    Asthma exacerbation   Colchicine Other (See Comments)    Pancreatitis   Doxycycline Hives   Statins Other (See Comments)    Joint stiffness   Avelox [Moxifloxacin Hcl In Nacl] Other (See Comments)    SEIZURE   Bactrim [Sulfamethoxazole-Trimethoprim] Hives   Cefdinir Hives   Gabapentin     Eye twitch and difficult focusing his vision   Losartan     Rash    Oregano [Origanum Oil] Cough   Peanut-Containing Drug Products Swelling    Throat swelling   Adhesive  [Tape] Other (See Comments)    "tears skin"   Penicillins Rash    ROS    See HPI Objective:    Physical Exam Constitutional:      General: He is  not in acute distress.    Appearance: Normal appearance. He is not ill-appearing.  HENT:     Head: Normocephalic and atraumatic.     Right Ear: External ear normal.     Left Ear: External ear normal.  Eyes:     Extraocular Movements: Extraocular movements intact.     Pupils: Pupils are equal, round, and reactive to light.  Skin:    General: Skin is warm and dry.  Neurological:     Mental Status: He is alert and oriented to person, place, and time.  Psychiatric:        Mood and Affect: Mood normal.        Behavior: Behavior normal.        Judgment: Judgment normal.     BP 139/80   Pulse (!) 54   Temp 98 F (36.7 C)   Resp 18   Ht _0  (1.676 m)   Wt 227 lb 9.6 oz (103.2 kg)   SpO2 98%   BMI 36.74 kg/m  Wt Readings from Last 3 Encounters:  02/18/22  227 lb 9.6 oz (103.2 kg)  11/17/21 228 lb (103.4 kg)  10/18/21 221 lb 9.6 oz (100.5 kg)       Assessment & Plan:   Problem List Items Addressed This Visit       Unprioritized   Severe persistent asthma    Reports asthma is stable. Maintained on Wixela and singulair and followed by pulmonary.       Hypothyroid - Primary    Clinically stable on synthroid. Continue same, obtain follow up tsh.       Relevant Orders   TSH   Hypertension    BP Readings from Last 3 Encounters:  02/18/22 139/80  11/17/21 140/82  10/18/21 123/67  BP stable. Continue amlodipine 51m.  Will continue to monitor renal function.  He is concerned about low electrolytes causing cramping after exercise. Recommend that he hydrate and stretch before/after exercise.       Relevant Orders   Comp Met (CMET)   Hyperlipidemia    Not on statin.  Lab Results  Component Value Date   CHOL 188 07/14/2021   HDL 39.90 07/14/2021   LDLCALC 121 (H) 07/14/2021   TRIG 138.0 07/14/2021   CHOLHDL 5 07/14/2021  Lipids look OK. He is statin intolerant.       Gout    Stable on allopurinol. Adheres strictly to a low purine diet.       Meds ordered this encounter  Medications   tiZANidine (ZANAFLEX) 2 MG tablet    Sig: Take 1-2 tablets (2-4 mg total) by mouth at bedtime as needed for muscle spasms.    Dispense:  60 tablet    Refill:  1    Order Specific Question:   Supervising Provider    Answer:   BPenni HomansA [[id number]]    I, MNance Pear NP, personally preformed the services described in this documentation.  All medical record entries made by the scribe were at my direction and in my presence.  I have reviewed the chart and discharge instructions (if applicable) and agree that the record reflects my personal performance and is accurate and complete. 02/18/2022   I,Amber Collins,acting as a scribe for MNance Pear NP.,have documented all relevant documentation on the behalf of MNance Pear NP,as directed by  MNance Pear NP while in the presence of MNance Pear NP.    MNance Pear NP

## 2022-02-18 NOTE — Assessment & Plan Note (Signed)
Stable on allopurinol. Adheres strictly to a low purine diet.

## 2022-02-28 DIAGNOSIS — G4733 Obstructive sleep apnea (adult) (pediatric): Secondary | ICD-10-CM | POA: Diagnosis not present

## 2022-03-30 DIAGNOSIS — G4733 Obstructive sleep apnea (adult) (pediatric): Secondary | ICD-10-CM | POA: Diagnosis not present

## 2022-04-16 ENCOUNTER — Other Ambulatory Visit: Payer: Self-pay | Admitting: Family

## 2022-04-20 ENCOUNTER — Ambulatory Visit: Payer: HMO | Admitting: Family

## 2022-04-21 ENCOUNTER — Other Ambulatory Visit: Payer: Self-pay

## 2022-04-21 MED ORDER — AMLODIPINE BESYLATE 5 MG PO TABS: ORAL_TABLET | ORAL | 1 refills | Status: DC

## 2022-04-30 DIAGNOSIS — G4733 Obstructive sleep apnea (adult) (pediatric): Secondary | ICD-10-CM | POA: Diagnosis not present

## 2022-05-05 ENCOUNTER — Other Ambulatory Visit: Payer: Self-pay | Admitting: *Deleted

## 2022-05-05 DIAGNOSIS — M109 Gout, unspecified: Secondary | ICD-10-CM

## 2022-05-05 MED ORDER — ALLOPURINOL 100 MG PO TABS: ORAL_TABLET | ORAL | 1 refills | Status: DC

## 2022-05-08 ENCOUNTER — Other Ambulatory Visit: Payer: Self-pay | Admitting: Allergy

## 2022-05-31 DIAGNOSIS — G4733 Obstructive sleep apnea (adult) (pediatric): Secondary | ICD-10-CM | POA: Diagnosis not present

## 2022-06-11 ENCOUNTER — Other Ambulatory Visit: Payer: Self-pay | Admitting: Pulmonary Disease

## 2022-06-15 MED ORDER — FLUTICASONE-SALMETEROL 500-50 MCG/ACT IN AEPB: 1.0000 | INHALATION_SPRAY | Freq: Two times a day (BID) | RESPIRATORY_TRACT | 1 refills | Status: DC

## 2022-06-15 NOTE — Addendum Note (Signed)
Addended byOralia Rud M on: 06/15/2022 02:55 PM   Modules accepted: Orders

## 2022-06-15 NOTE — Telephone Encounter (Addendum)
Patients last ov 09/13/21.  Per chart note:  Return in about 4 months (around 01/14/2022). Chi Rodman Pickle, MD   Refill Wixela 500/50.  Patient needs OV.

## 2022-06-29 DIAGNOSIS — G4733 Obstructive sleep apnea (adult) (pediatric): Secondary | ICD-10-CM | POA: Diagnosis not present

## 2022-07-04 ENCOUNTER — Ambulatory Visit (INDEPENDENT_AMBULATORY_CARE_PROVIDER_SITE_OTHER): Payer: PPO | Admitting: Family

## 2022-07-04 ENCOUNTER — Ambulatory Visit (HOSPITAL_BASED_OUTPATIENT_CLINIC_OR_DEPARTMENT_OTHER)
Admission: RE | Admit: 2022-07-04 | Discharge: 2022-07-04 | Disposition: A | Discharge status: Home / Self Care | Payer: PPO | Source: Ambulatory Visit | Attending: Family | Admitting: Family

## 2022-07-04 ENCOUNTER — Encounter (HOSPITAL_BASED_OUTPATIENT_CLINIC_OR_DEPARTMENT_OTHER): Payer: Self-pay

## 2022-07-04 ENCOUNTER — Telehealth: Payer: Self-pay | Admitting: Family

## 2022-07-04 VITALS — BP 148/85 | HR 55 | Temp 97.5°F | Resp 16 | Wt 235.0 lb

## 2022-07-04 DIAGNOSIS — J3089 Other allergic rhinitis: Secondary | ICD-10-CM

## 2022-07-04 DIAGNOSIS — E782 Mixed hyperlipidemia: Secondary | ICD-10-CM

## 2022-07-04 DIAGNOSIS — M1A9XX0 Chronic gout, unspecified, without tophus (tophi): Secondary | ICD-10-CM | POA: Diagnosis not present

## 2022-07-04 DIAGNOSIS — I10 Essential (primary) hypertension: Secondary | ICD-10-CM

## 2022-07-04 DIAGNOSIS — K219 Gastro-esophageal reflux disease without esophagitis: Secondary | ICD-10-CM | POA: Diagnosis not present

## 2022-07-04 DIAGNOSIS — J4551 Severe persistent asthma with (acute) exacerbation: Secondary | ICD-10-CM | POA: Diagnosis not present

## 2022-07-04 DIAGNOSIS — E039 Hypothyroidism, unspecified: Secondary | ICD-10-CM | POA: Diagnosis not present

## 2022-07-04 DIAGNOSIS — R079 Chest pain, unspecified: Secondary | ICD-10-CM | POA: Diagnosis not present

## 2022-07-04 DIAGNOSIS — R0602 Shortness of breath: Secondary | ICD-10-CM | POA: Diagnosis not present

## 2022-07-04 LAB — BASIC METABOLIC PANEL
BUN: 21 mg/dL (ref 6–23)
CO2: 28 mEq/L (ref 19–32)
Calcium: 9.7 mg/dL (ref 8.4–10.5)
Chloride: 102 mEq/L (ref 96–112)
Creatinine, Ser: 1.3 mg/dL (ref 0.40–1.50)
GFR: 55.8 mL/min — ABNORMAL LOW (ref >60.00)
Glucose, Bld: 96 mg/dL (ref 70–99)
Potassium: 3.9 mEq/L (ref 3.5–5.1)
Sodium: 138 mEq/L (ref 135–145)

## 2022-07-04 LAB — URIC ACID: Uric Acid, Serum: 5.2 mg/dL (ref 4.0–7.8)

## 2022-07-04 LAB — TSH: TSH: 1.7 u[IU]/mL (ref 0.35–5.50)

## 2022-07-04 MED ORDER — IOHEXOL 350 MG/ML SOLN
100.0000 mL | Freq: Once | INTRAVENOUS | Status: AC | PRN
  Administered 2022-07-04: 100 mL via INTRAVENOUS

## 2022-07-04 MED ORDER — AMLODIPINE BESYLATE 5 MG PO TABS: 7.5000 mg | ORAL_TABLET | Freq: Every day | ORAL | 1 refills | Status: DC

## 2022-07-04 NOTE — Assessment & Plan Note (Signed)
Lab Results  Component Value Date   TSH 3.04 02/18/2022    Continues synthroid 75 mcg.

## 2022-07-04 NOTE — Telephone Encounter (Signed)
Spoke to patient.  Reviewed CT negative for PE, note of lung nodule and need for follow up CT in 6-12 months. Suggested that he also schedule a follow up visit with Dr. Loanne Drilling, pulmonology.

## 2022-07-04 NOTE — Assessment & Plan Note (Signed)
Lab Results  Component Value Date   CHOL 188 07/14/2021   HDL 39.90 07/14/2021   LDLCALC 121 (H) 07/14/2021   TRIG 138.0 07/14/2021   CHOLHDL 5 07/14/2021   Stable without statin. Continue low cholesterol diet.

## 2022-07-04 NOTE — Assessment & Plan Note (Addendum)
Stable with allegra and singulair.

## 2022-07-04 NOTE — Assessment & Plan Note (Signed)
Notes that he has had symptoms on allopurinol.  Feeling better since he started prednisone, intolerant to colchicine, continues allopurinol.

## 2022-07-04 NOTE — Progress Notes (Signed)
Subjective:     Patient ID: Michael [name], male    DOB: 1950-05-14, 72 y.o.   MRN: [id number]  Chief Complaint  Patient presents with   Asthma    Patient reports asthma "flare up that started Friday". Started prednisone.    Flank Pain    Complains of right flank pain on and off since December.     HPI Patient is in today for follow up. Reports + pain up under the right ribs since Christmas.  Has been off/on since Christmas but now constant.  Asthma started to act up last week.  He had some prednisone at home which he started.   This is helping some but not completely "knocking it down."    Flew to [facility] in December.   Health Maintenance Due  Topic Date Due   COVID-19 Vaccine (7 - 2023-24 season) 03/21/2022    Past Medical History:  Diagnosis Date   Allergy    Arthritis    Asthma    Cancer ([place])    basal cell and squamous cell carcinoma   Cancer (HCC)    prostate   Cataract 2023   Clotting disorder (HCC)    Colon polyps    COPD (chronic obstructive pulmonary disease) ([place]) 1975   GERD (gastroesophageal reflux disease)    Gout    High cholesterol    History of hepatitis    due to drug interaction?   History of pancreatitis 2016   ?due to colchicine   History of pulmonary embolus (PE) 2016   History of stomach ulcers    Hypertension    Hypothyroidism    OSA (obstructive sleep apnea) 2015   has CPAP   Pneumonia    Sleep apnea    Thyroid disease    hypothyroid   Ulcer 1985   Urinary incontinence     Past Surgical History:  Procedure Laterality Date   BASAL CELL CARCINOMA EXCISION  2017   MOHS.  beneath right eye   [place]   Fractured skull in an accident   [place]  [id number]   Complication of gall bladder surgery   COLONOSCOPY     multiple - last 06/2017   COSMETIC SURGERY  2017   Basal cell cancer - MOHS surgery   HERNIA REPAIR  2001   HIP SURGERY     JOINT REPLACEMENT  06/2016   Total hip  replacement - left hip   PENILE PROSTHESIS IMPLANT  2016   PROSTATECTOMY  2009   SINUS SURGERY WITH INSTATRAK  2014   SQUAMOUS CELL CARCINOMA EXCISION  01/2017   nose   TOOTH EXTRACTION     TOTAL HIP ARTHROPLASTY  06/2016    Family History  Problem Relation Age of Onset   Asthma Mother    Arthritis Mother    Skin cancer Mother        basal cell carcinoma   Kidney failure Mother    Atrial fibrillation Mother    Kidney disease Mother    Obesity Mother    Asthma Father    Arthritis Father    Diabetes Father    Heart attack Father    Hyperlipidemia Father    Hypertension Father    Kidney disease Father    Obesity Father    Diabetes Brother    Arthritis Brother    Hyperlipidemia Brother    Brain cancer Brother        GBM   Cancer  Brother    Alcohol abuse Brother    Breast cancer Maternal Grandmother    Cancer Maternal Grandmother    Diabetes Paternal Grandfather    Colon cancer Neg Hx    Esophageal cancer Neg Hx    Liver cancer Neg Hx    Pancreatic cancer Neg Hx    Rectal cancer Neg Hx    Stomach cancer Neg Hx     Social History   Socioeconomic History   Marital status: Married    Spouse name: Not on file   Number of children: 3   Years of education: Not on file   Highest education level: Not on file  Occupational History   Not on file  Tobacco Use   Smoking status: Never   Smokeless tobacco: Never  Vaping Use   Vaping Use: Never used  Substance and Sexual Activity   Alcohol use: Yes    Alcohol/week: 3.0 standard drinks of alcohol    Types: 3 Glasses of wine per week    Comment: Very limited   Drug use: No   Sexual activity: Yes    Birth control/protection: Surgical    Comment: Prostate removal  Other Topics Concern   Not on file  Social History Narrative   Former Lawyer at [employer], now Optician, dispensing.    Married   3 daughters- all live locally   Has 7 grandchildren and 1 great grandchild   Enjoys reading, Geophysicist/field seismologist          Lives in a one story home with a bonus room over the garage.   Social Determinants of Health   Financial Resource Strain: Low Risk  (08/13/2021)   Overall Financial Resource Strain (CARDIA)    Difficulty of Paying Living Expenses: Not hard at all  Food Insecurity: No Food Insecurity (08/13/2021)   Hunger Vital Sign    Worried About Running Out of Food in the Last Year: Never true    Ran Out of Food in the Last Year: Never true  Transportation Needs: No Transportation Needs (08/13/2021)   PRAPARE - Hydrologist (Medical): No    Lack of Transportation (Non-Medical): No  Physical Activity: Sufficiently Active (08/13/2021)   Exercise Vital Sign    Days of Exercise per Week: 5 days    Minutes of Exercise per Session: 50 min  Stress: No Stress Concern Present (08/13/2021)   [place]    Feeling of Stress : Not at all  Social Connections: [place] (08/13/2021)   Social Connection and Isolation Panel [NHANES]    Frequency of Communication with Friends and Family: More than three times a week    Frequency of Social Gatherings with Friends and Family: More than three times a week    Attends Religious Services: More than 4 times per year    Active Member of [employer] or Organizations: Yes    Attends Music therapist: More than 4 times per year    Marital Status: Married  Human resources officer Violence: Not At Risk (08/13/2021)   Humiliation, Afraid, Rape, and Kick questionnaire    Fear of Current or Ex-Partner: No    Emotionally Abused: No    Physically Abused: No    Sexually Abused: No    Outpatient Medications Prior to Visit  Medication Sig Dispense Refill   albuterol (VENTOLIN HFA) 108 (90 Base) MCG/ACT inhaler Inhale 2 puffs into the lungs every 6 (six) hours as needed  for wheezing or shortness of breath. 18 g 1   allopurinol (ZYLOPRIM) 100 MG tablet TAKE 1 TABLET(100  MG) BY MOUTH TWICE DAILY 180 tablet 1   calcium carbonate (OS-CAL - DOSED IN MG OF ELEMENTAL CALCIUM) 1250 (500 Ca) MG tablet Take 1 tablet by mouth daily.     Cholecalciferol (VITAMIN D3) 5000 units CAPS Take by mouth.     clotrimazole-betamethasone (LOTRISONE) cream Apply 1 application topically 2 (two) times daily. 30 g 0   docusate sodium (COLACE) 100 MG capsule Take 200 mg by mouth daily.      EPINEPHrine 0.3 mg/0.3 mL IJ SOAJ injection Use as directed for severe allergic reactions 2 each 2   fexofenadine (ALLEGRA) 180 MG tablet Take 180 mg by mouth daily.      fluticasone-salmeterol (WIXELA INHUB) 500-50 MCG/ACT AEPB Inhale 1 puff into the lungs 2 (two) times daily. in the morning and at bedtime. 60 each 1   ipratropium-albuterol (DUONEB) 0.5-2.5 (3) MG/3ML SOLN 1 vial in nebulizer every 4-6 hours while awake for the next 3-5 days for coughing or wheezing. 360 mL 1   levothyroxine (SYNTHROID) 75 MCG tablet TAKE 1 TABLET(75 MCG) BY MOUTH DAILY BEFORE BREAKFAST 90 tablet 1   Magnesium 250 MG TABS Take by mouth.     montelukast (SINGULAIR) 10 MG tablet Take one tablet once at night for coughing or wheezing 90 tablet 3   tiZANidine (ZANAFLEX) 2 MG tablet TAKE 1 TO 2 TABLETS(2 TO 4 MG) BY MOUTH AT BEDTIME AS NEEDED FOR MUSCLE SPASMS 60 tablet 1   amLODipine (NORVASC) 5 MG tablet TAKE 1 TABLET(5 MG) BY MOUTH DAILY 90 tablet 1   No facility-administered medications prior to visit.    Allergies  Allergen Reactions   Baclofen Shortness Of Breath    Asthma exacerbation   Colchicine Other (See Comments)    Pancreatitis   Doxycycline Hives   Statins Other (See Comments)    Joint stiffness   Avelox [Moxifloxacin Hcl In Nacl] Other (See Comments)    SEIZURE   Bactrim [Sulfamethoxazole-Trimethoprim] Hives   Cefdinir Hives   Gabapentin     Eye twitch and difficult focusing his vision   Losartan     Rash    Oregano [Origanum Oil] Cough   Peanut-Containing Drug Products Swelling    Throat  swelling   Adhesive  [Tape] Other (See Comments)    "tears skin"   Penicillins Rash    ROS See HPI    Objective:    Physical Exam Constitutional:      General: He is not in acute distress.    Appearance: He is well-developed.  HENT:     Head: Normocephalic and atraumatic.  Cardiovascular:     Rate and Rhythm: Normal rate and regular rhythm.     Heart sounds: No murmur heard. Pulmonary:     Effort: Pulmonary effort is normal. No respiratory distress.     Breath sounds: Examination of the right-lower field reveals decreased breath sounds. Examination of the left-lower field reveals decreased breath sounds. Decreased breath sounds present. No wheezing or rales.  Skin:    General: Skin is warm and dry.  Neurological:     Mental Status: He is alert and oriented to person, place, and time.  Psychiatric:        Behavior: Behavior normal.        Thought Content: Thought content normal.     BP (!) 148/85   Pulse (!) 55   Temp (!) 97.5 F (  36.4 C) (Oral)   Resp 16   Wt 235 lb (106.6 kg)   SpO2 100%   BMI 37.93 kg/m  Wt Readings from Last 3 Encounters:  07/04/22 235 lb (106.6 kg)  02/18/22 227 lb 9.6 oz (103.2 kg)  11/17/21 228 lb (103.4 kg)       Assessment & Plan:   Problem List Items Addressed This Visit       Unprioritized   Severe persistent asthma    Notes improving since beginning the prednisone taper that he had on hand '40mg'$  x 2 days, '30mg'$  x 2 days, '20mg'$  x 2 days, '10mg'$  x 2 days. He is also Maintained on Wixela 500-'50mg'$ .  Continue same along with albuterol. He will let me know if his symptoms do not continue to improve.       Other allergic rhinitis    Stable with allegra and singulair.       Hypothyroid    Lab Results  Component Value Date   TSH 3.04 02/18/2022   Continues synthroid 75 mcg.       Relevant Orders   TSH   Hypertension    BP Readings from Last 3 Encounters:  07/04/22 (!) 157/73  02/18/22 139/80  11/17/21 140/82  Reports home bp  readings have been elevated for the last couple of weeks.  On amlodipine '5mg'$ . Will increase amlodipine to 7.'5mg'$  once daily and pt will send me some updated readings via mychart.       Relevant Medications   amLODipine (NORVASC) 5 MG tablet   Hyperlipidemia    Lab Results  Component Value Date   CHOL 188 07/14/2021   HDL 39.90 07/14/2021   LDLCALC 121 (H) 07/14/2021   TRIG 138.0 07/14/2021   CHOLHDL 5 07/14/2021  Stable without statin. Continue low cholesterol diet.       Relevant Medications   amLODipine (NORVASC) 5 MG tablet   Gout    Notes that he has had symptoms on allopurinol.  Feeling better since he started prednisone, intolerant to colchicine, continues allopurinol.       Relevant Orders   Uric acid   Gastroesophageal reflux disease    Worsened since he started prednisone but overall stable.       Other Visit Diagnoses     Chest pain, unspecified type    -  Primary   Relevant Orders   Basic Metabolic Panel (BMET)   CT Angio Chest Pulmonary Embolism (PE) W or WO Contrast (Completed)        I have changed Mellody Memos. Frances "Martin"'s amLODipine. I am also having him maintain his Vitamin D3, docusate sodium, fexofenadine, Magnesium, calcium carbonate, clotrimazole-betamethasone, albuterol, EPINEPHrine, montelukast, ipratropium-albuterol, levothyroxine, tiZANidine, allopurinol, and fluticasone-salmeterol.  Meds ordered this encounter  Medications   amLODipine (NORVASC) 5 MG tablet    Sig: Take 1.5 tablets (7.5 mg total) by mouth daily. TAKE 1 TABLET(5 MG) BY MOUTH DAILY    Dispense:  135 tablet    Refill:  1    Order Specific Question:   Supervising Provider    Answer:   Penni Homans A [[id number]]

## 2022-07-04 NOTE — Assessment & Plan Note (Addendum)
Notes improving since beginning the prednisone taper that he had on hand '40mg'$  x 2 days, '30mg'$  x 2 days, '20mg'$  x 2 days, '10mg'$  x 2 days. He is also Maintained on Wixela 500-'50mg'$ .  Continue same along with albuterol. He will let me know if his symptoms do not continue to improve.

## 2022-07-04 NOTE — Assessment & Plan Note (Signed)
Worsened since he started prednisone but overall stable.

## 2022-07-04 NOTE — Assessment & Plan Note (Addendum)
BP Readings from Last 3 Encounters:  07/04/22 (!) 157/73  02/18/22 139/80  11/17/21 140/82   Reports home bp readings have been elevated for the last couple of weeks.  On amlodipine '5mg'$ . Will increase amlodipine to 7.'5mg'$  once daily and pt will send me some updated readings via mychart.

## 2022-07-07 ENCOUNTER — Other Ambulatory Visit: Payer: Self-pay

## 2022-07-07 MED ORDER — AMLODIPINE BESYLATE 5 MG PO TABS: 7.5000 mg | ORAL_TABLET | Freq: Every day | ORAL | 0 refills | Status: DC

## 2022-07-07 MED ORDER — LEVOTHYROXINE SODIUM 75 MCG PO TABS: 75.0000 ug | ORAL_TABLET | Freq: Every day | ORAL | 0 refills | Status: DC

## 2022-07-20 ENCOUNTER — Ambulatory Visit (INDEPENDENT_AMBULATORY_CARE_PROVIDER_SITE_OTHER): Payer: PPO | Admitting: Pulmonary Disease

## 2022-07-20 ENCOUNTER — Encounter (HOSPITAL_BASED_OUTPATIENT_CLINIC_OR_DEPARTMENT_OTHER): Payer: Self-pay | Admitting: Pulmonary Disease

## 2022-07-20 VITALS — BP 156/88 | HR 68 | Temp 97.7°F | Ht 66.0 in | Wt 236.6 lb

## 2022-07-20 DIAGNOSIS — G4733 Obstructive sleep apnea (adult) (pediatric): Secondary | ICD-10-CM | POA: Diagnosis not present

## 2022-07-20 DIAGNOSIS — J4550 Severe persistent asthma, uncomplicated: Secondary | ICD-10-CM | POA: Diagnosis not present

## 2022-07-20 MED ORDER — MONTELUKAST SODIUM 10 MG PO TABS: ORAL_TABLET | ORAL | 3 refills | Status: DC

## 2022-07-20 MED ORDER — FLUTICASONE-SALMETEROL 500-50 MCG/ACT IN AEPB: 1.0000 | INHALATION_SPRAY | Freq: Two times a day (BID) | RESPIRATORY_TRACT | 5 refills | Status: DC

## 2022-07-20 MED ORDER — EPINEPHRINE 0.3 MG/0.3ML IJ SOAJ: INTRAMUSCULAR | 2 refills | Status: DC

## 2022-07-20 MED ORDER — PREDNISONE 10 MG PO TABS: ORAL_TABLET | ORAL | 0 refills | Status: AC

## 2022-07-20 NOTE — Progress Notes (Unsigned)
Subjective:   PATIENT ID: Michael [name] GENDER: male DOB: January 02, 1951, MRN: [id number]   HPI  Chief Complaint  Patient presents with   Follow-up    Follow up. Patient stated he is having challenges with his asthma.     Reason for Visit: Follow-up  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  Synopsis: He is a [place] resident however travels often for his work.  He is a Optometrist in healthcare, transitioning hospital systems to a "cloud". He travels frequently for work. In December 2019 he went on a cruise and stayed in [place] during January-March 2020. In January he had a severe respiratory illness after his cruise manifested as coughing, wheezing and shortness of breath that required nebulizer use 3-4 times a day.  He did not require hospitalization.    His asthma has been previously managed by Dr. Verlin Fester an allergist.  Previously on Berna Bue, [place] and Nasacort. Symptoms aggravated by grass and trees. He enjoys gardening   08/17/21 He was last seen in clinic with me in 10/2018. Since then he has been seen in Allergy clinic for management of asthma. Previously on [place] and [place] however discontinued due to insurance. Also has tried Xolair in the past without improvement. Has ran out of his [place] Mannan and currently only on Incruse and montelukast. Symptoms uncontrolled with shortness of breath and cough. Has recently been treated for exacerbation one month ago with prednisone taper.  09/13/21 Since our last visit, he has continued to have symptoms. Unable to obtain Dupixent due to high co-pay. He continues to have symptoms of shortness of breath and cough on current inhalers. Also since our last visit we have made two requests to [facility] for sleep records. Before  his CPAP machine broke down he reported benefit of sleep quality when wearing his machine and now that he has not used it, he has noticed a difference in his breathing and sleep.  07/20/22 Since our last visit he reports well controlled symptoms on Advair 500-50 mcg and montelukast. Very rarely used albuterol 2-3 times a month. Last exacerbation with me was 08/2021. His PCP prescribed prednisone taper earlier this month. He will have nocturnal cough once a week. Pollen exacerbates it so this is what he is attributing. Currently enrolled in weight watchers  Asthma Control Test ACT Total Score  07/20/2022  9:43 AM 12  09/13/2021  3:25 PM 12  08/17/2021  3:52 PM 16   Social History: Never smoker  Environmental exposures: Travel exposure to high risk COVID areas prior to 2020 pandemic.  No formal diagnosis of COVID   Past Medical History:  Diagnosis Date   Allergy    Arthritis    Asthma    Cancer ([facility])    basal cell and squamous cell carcinoma   Cancer (H[facility])    prostate   Cataract 2023   Clotting disorder (HCC)    Colon polyps    COPD (chronic obstructive pulmonary disease) ([facility]) 1975   GERD (gastroesophageal reflux disease)    Gout    High cholesterol    History of hepatitis    due to drug interaction?   History of pancreatitis 2016   ?due to colchicine   History of pulmonary embolus ([facility]) 2016   History of stomach ulcers    Hypertension    Hypothyroidism  OSA (obstructive sleep apnea) 2015   has CPAP   Pneumonia    Sleep apnea    Thyroid disease    hypothyroid   Ulcer 1985   Urinary incontinence      Family History  Problem Relation Age of Onset   Asthma Mother    Arthritis Mother    Skin cancer Mother        basal cell carcinoma   Kidney failure Mother    Atrial fibrillation Mother    Kidney disease Mother    Obesity Mother    Asthma Father    Arthritis Father    Diabetes Father    Heart attack Father    Hyperlipidemia Father    Hypertension Father     Kidney disease Father    Obesity Father    Diabetes Brother    Arthritis Brother    Hyperlipidemia Brother    Brain cancer Brother        GBM   Cancer Brother    Alcohol abuse Brother    Breast cancer Maternal Grandmother    Cancer Maternal Grandmother    Diabetes Paternal Grandfather    Colon cancer Neg Hx    Esophageal cancer Neg Hx    Liver cancer Neg Hx    Pancreatic cancer Neg Hx    Rectal cancer Neg Hx    Stomach cancer Neg Hx      Social History   Occupational History   Not on file  Tobacco Use   Smoking status: Never   Smokeless tobacco: Never  Vaping Use   Vaping Use: Never used  Substance and Sexual Activity   Alcohol use: Yes    Alcohol/week: 3.0 standard drinks of alcohol    Types: 3 Glasses of wine per week    Comment: Very limited   Drug use: No   Sexual activity: Yes    Birth control/protection: Surgical    Comment: Prostate removal    Allergies  Allergen Reactions   Baclofen Shortness Of Breath    Asthma exacerbation   Colchicine Other (See Comments)    Pancreatitis   Doxycycline Hives   Statins Other (See Comments)    Joint stiffness   Avelox [Moxifloxacin Hcl In Nacl] Other (See Comments)    SEIZURE   Bactrim [Sulfamethoxazole-Trimethoprim] Hives   Cefdinir Hives   Gabapentin     Eye twitch and difficult focusing his vision   Losartan     Rash    Oregano [Origanum Oil] Cough   Peanut-Containing Drug Products Swelling    Throat swelling   Adhesive  [Tape] Other (See Comments)    "tears skin"   Penicillins Rash     Outpatient Medications Prior to Visit  Medication Sig Dispense Refill   albuterol (VENTOLIN HFA) 108 (90 Base) MCG/ACT inhaler Inhale 2 puffs into the lungs every 6 (six) hours as needed for wheezing or shortness of breath. 18 g 1   allopurinol (ZYLOPRIM) 100 MG tablet TAKE 1 TABLET(100 MG) BY MOUTH TWICE DAILY 180 tablet 1   amLODipine (NORVASC) 5 MG tablet Take 1.5 tablets (7.5 mg total) by mouth daily. 135 tablet 0    calcium carbonate (OS-CAL - DOSED IN MG OF ELEMENTAL CALCIUM) 1250 (500 Ca) MG tablet Take 1 tablet by mouth daily.     Cholecalciferol (VITAMIN D3) 5000 units CAPS Take by mouth.     clotrimazole-betamethasone (LOTRISONE) cream Apply 1 application topically 2 (two) times daily. 30 g 0   docusate sodium (COLACE) 100 MG capsule  Take 200 mg by mouth daily.      EPINEPHrine 0.3 mg/0.3 mL IJ SOAJ injection Use as directed for severe allergic reactions 2 each 2   fexofenadine (ALLEGRA) 180 MG tablet Take 180 mg by mouth daily.      fluticasone-salmeterol (WIXELA INHUB) 500-50 MCG/ACT AEPB Inhale 1 puff into the lungs 2 (two) times daily. in the morning and at bedtime. 60 each 1   ipratropium-albuterol (DUONEB) 0.5-2.5 (3) MG/3ML SOLN 1 vial in nebulizer every 4-6 hours while awake for the next 3-5 days for coughing or wheezing. 360 mL 1   levothyroxine (SYNTHROID) 75 MCG tablet Take 1 tablet (75 mcg total) by mouth daily before breakfast. 90 tablet 0   Magnesium 250 MG TABS Take by mouth.     montelukast (SINGULAIR) 10 MG tablet Take one tablet once at night for coughing or wheezing 90 tablet 3   tiZANidine (ZANAFLEX) 2 MG tablet TAKE 1 TO 2 TABLETS(2 TO 4 MG) BY MOUTH AT BEDTIME AS NEEDED FOR MUSCLE SPASMS 60 tablet 1   No facility-administered medications prior to visit.    Review of Systems  Constitutional:  Negative for chills, diaphoresis, fever, malaise/fatigue and weight loss.  HENT:  Negative for congestion.   Respiratory:  Positive for cough and shortness of breath. Negative for hemoptysis, sputum production and wheezing.   Cardiovascular:  Negative for chest pain, palpitations and leg swelling.     Objective:   Vitals:   07/20/22 0942  BP: (!) 156/88  Pulse: 68  Temp: 97.7 F (36.5 C)  TempSrc: Oral  SpO2: 99%  Weight: 236 lb 9.6 oz (107.3 kg)  Height: 5\' 6"  (1.676 m)   SpO2: 99 % O2 Device: None (Room air)  Physical Exam: General: Well-appearing, no acute  distress HENT: [place], AT. Right ear with slightly bulging and erythematous TM. Normal left TM Eyes: EOMI, no scleral icterus Respiratory: Diminished but clear to auscultation bilaterally.  No crackles, wheezing or rales Cardiovascular: RRR, -M/R/G, no JVD Extremities:-Edema,-tenderness Neuro: AAO x4, CNII-XII grossly intact Psych: Normal mood, normal affect   Data Reviewed:  Imaging: CXR 05/04/2018-low lung volumes with mild bibasilar atelectasis.  No effusions, edema or infiltrates CT Chest 10/27/20 - Linear atelectasis in RML, lingular and lower lobes. Biapical scarring. RUL calcified granuloma.  PFT: 05/11/2018 FVC 2.57 (63 %) FEV1 1.53 (51 %) Ratio 60.  Postbronchodilator response with 32% change in FVC and 11% FEV1 Interpretation: Moderately severe obstructive defect with significant bronchodilator  Labs: CBC 11/16/2018 WBC 6.3 with no evidence of eosinophils with absolute eosinophil count 0.0 IgE 11/16/2018 mildly elevated 124 (upper limit 114) Imaging, labs and tests noted above have been reviewed independently by me.  CBC    Component Value Date/Time   WBC 6.5 08/17/2021 1635   RBC 4.79 08/17/2021 1635   HGB 15.6 08/17/2021 1635   HGB 15.4 02/09/2017 1629   HCT 46.1 08/17/2021 1635   HCT 45.3 02/09/2017 1629   PLT 253.0 08/17/2021 1635   PLT 340 02/09/2017 1629   MCV 96.2 08/17/2021 1635   MCV 94 02/09/2017 1629   MCH 32.5 12/18/2019 0820   MCHC 33.8 08/17/2021 1635   RDW 13.1 08/17/2021 1635   RDW 13.2 02/09/2017 1629   LYMPHSABS 1.5 08/17/2021 1635   LYMPHSABS 1.5 02/09/2017 1629   MONOABS 0.7 08/17/2021 1635   EOSABS 0.4 08/17/2021 1635   EOSABS 0.1 02/09/2017 1629   BASOSABS 0.1 08/17/2021 1635   BASOSABS 0.0 02/09/2017 1629      Assessment &  Plan:   Discussion: 72 year old male never smoker with severe persistent asthma previously controlled on biologics now with uncontrolled symptoms. Has tried Xolair and Fasenra in the past. Dupixent at the best results  before however unable to continue due to high co-pay. Currently in exacerbation. Exam also consistent with left AOM.   Severe persistent asthma - overall controlled --CONTINUE Advair 500-50 mcg ONE puff TWICE a day --CONTINUE montelukast 10 mg daily --CONTINUE Duonebs --Prophylactic prednisone taper ordered  OSA on CPAP Patient uses NIV for more than four hours nightly for at least 70% of nights during the last three months of usage. The patient has been using and benefiting from PAP use and will continue to benefit from therapy.  ***Last sleep study 8 years ago: [place] 10/31/2013 however unable to review report Per OSH note: AHI was 34.9 and the final CPAP pressure was 14 cm H20. --Continue auto CPAP 5-20 --Counseled on sleep hygiene --Counseled on weight loss/maintenance of healthy weight --Counseled NOT to drive if/when sleepy --Advised patient to wear CPAP for at least 4 hours each night for greater than 70% of the time to avoid the machine being repossessed by insurance.  Allergic rhinitis --CONTINUE Allegra daily --CONTINUE Nasacort daily  Prevention of venous thromboembolism --Compression stockings --Recommend frequent ambulation every 1-2 hours --Ankle flexion and extension while seated  Health Maintenance Immunization History  Administered Date(s) Administered   Fluad Quad(high Dose 65+) 01/29/2019, 07/03/2020, 01/12/2021   Influenza Split 04/08/2013   Influenza, High Dose Seasonal PF 01/19/2018   Influenza, Quadrivalent, Recombinant, Inj, Pf 01/13/2017   Influenza-Unspecified 01/21/2014, 01/20/2015, 01/21/2016, 12/10/2021, 01/24/2022   PFIZER Comirnaty(Gray Top)Covid-19 Tri-Sucrose Vaccine 10/09/2020   PFIZER(Purple Top)SARS-COV-2 Vaccination 06/06/2019, 07/01/2019, 03/03/2020, 01/24/2022   Pfizer Covid-19 Vaccine Bivalent Booster 27yrs & up 01/20/2021   Pneumococcal Conjugate-13 08/15/2013, 11/16/2018   Pneumococcal Polysaccharide-23 11/01/2013, 12/18/2019    Respiratory Syncytial Virus Vaccine,Recomb Aduvanted(Arexvy) 02/07/2022   Tdap 08/15/2013   Zoster Recombinat (Shingrix) 01/19/2018, 06/15/2018   Zoster, Live 05/14/2015   No orders of the defined types were placed in this encounter.  No orders of the defined types were placed in this encounter.  No follow-ups on file.  I have spent a total time of 33-minutes on the day of the appointment including chart review, data review, collecting history, coordinating care and discussing medical diagnosis and plan with the patient/family. Past medical history, allergies, medications were reviewed. Pertinent imaging, labs and tests included in this note have been reviewed and interpreted independently by me.  [place], MD [facility] 07/20/2022 9:46 AM  Office Number [phone]

## 2022-07-20 NOTE — Patient Instructions (Signed)
Severe persistent asthma - overall controlled --CONTINUE Advair 500-50 mcg ONE puff TWICE a day --CONTINUE montelukast 10 mg daily --CONTINUE Duonebs --Prophylactic prednisone taper ordered  OSA on CPAP --Continue auto CPAP 5-20 --Counseled on sleep hygiene --Counseled on weight loss/maintenance of healthy weight --Counseled NOT to drive if/when sleepy --Advised patient to wear CPAP for at least 4 hours each night for greater than 70% of the time to avoid the machine being repossessed by insurance.  Prevention of venous thromboembolism --Compression stockings --Recommend frequent ambulation every 1-2 hours --Ankle flexion and extension while seated

## 2022-07-21 ENCOUNTER — Encounter (HOSPITAL_BASED_OUTPATIENT_CLINIC_OR_DEPARTMENT_OTHER): Payer: Self-pay | Admitting: Pulmonary Disease

## 2022-07-25 ENCOUNTER — Encounter: Payer: Self-pay | Admitting: Family

## 2022-07-30 DIAGNOSIS — G4733 Obstructive sleep apnea (adult) (pediatric): Secondary | ICD-10-CM | POA: Diagnosis not present

## 2022-08-05 ENCOUNTER — Encounter: Payer: Self-pay | Admitting: Internal Medicine

## 2022-08-09 ENCOUNTER — Encounter: Payer: Self-pay | Admitting: Internal Medicine

## 2022-08-15 ENCOUNTER — Encounter: Payer: Self-pay | Admitting: Internal Medicine

## 2022-08-16 ENCOUNTER — Telehealth: Payer: Self-pay | Admitting: Family

## 2022-08-16 NOTE — Telephone Encounter (Signed)
Contacted Michael [name] to schedule their annual wellness visit. Appointment made for 08/25/2022.  Michael [name]; Care Guide Ambulatory Clinical Support  l [facility] Direct Dial: [phone]

## 2022-08-22 ENCOUNTER — Ambulatory Visit: Payer: PPO | Admitting: Family

## 2022-08-24 ENCOUNTER — Ambulatory Visit (INDEPENDENT_AMBULATORY_CARE_PROVIDER_SITE_OTHER): Payer: PPO | Admitting: Family

## 2022-08-24 VITALS — BP 142/75 | HR 58 | Temp 98.0°F | Resp 18 | Ht 66.0 in | Wt 230.8 lb

## 2022-08-24 DIAGNOSIS — M109 Gout, unspecified: Secondary | ICD-10-CM

## 2022-08-24 DIAGNOSIS — G4733 Obstructive sleep apnea (adult) (pediatric): Secondary | ICD-10-CM | POA: Diagnosis not present

## 2022-08-24 DIAGNOSIS — I10 Essential (primary) hypertension: Secondary | ICD-10-CM

## 2022-08-24 DIAGNOSIS — E039 Hypothyroidism, unspecified: Secondary | ICD-10-CM | POA: Diagnosis not present

## 2022-08-24 DIAGNOSIS — G2581 Restless legs syndrome: Secondary | ICD-10-CM | POA: Diagnosis not present

## 2022-08-24 DIAGNOSIS — J4550 Severe persistent asthma, uncomplicated: Secondary | ICD-10-CM

## 2022-08-24 DIAGNOSIS — M1A9XX0 Chronic gout, unspecified, without tophus (tophi): Secondary | ICD-10-CM | POA: Diagnosis not present

## 2022-08-24 MED ORDER — LEVOTHYROXINE SODIUM 75 MCG PO TABS: 75.0000 ug | ORAL_TABLET | Freq: Every day | ORAL | 1 refills | Status: DC

## 2022-08-24 MED ORDER — AMLODIPINE BESYLATE 10 MG PO TABS: 10.0000 mg | ORAL_TABLET | Freq: Every day | ORAL | 1 refills | Status: DC

## 2022-08-24 MED ORDER — ALLOPURINOL 100 MG PO TABS: ORAL_TABLET | ORAL | 1 refills | Status: DC

## 2022-08-24 NOTE — Assessment & Plan Note (Signed)
Stable/good compliance, management per pulmonary.

## 2022-08-24 NOTE — Assessment & Plan Note (Signed)
Lab Results  Component Value Date   TSH 1.70 07/04/2022   Stable on synthroid 75 mcg.

## 2022-08-24 NOTE — Assessment & Plan Note (Signed)
Had a bad flare of gout a few weeks ago. Continues allopurinol bid. He is unable to take colchicine due to hx of pancreatitis whie taking.

## 2022-08-24 NOTE — Progress Notes (Signed)
Subjective:   By signing my name below, I, Michael [name], attest that this documentation has been prepared under the direction and in the presence of Sandford Craze, NP. 08/24/2022   Patient ID: Michael [name], male    DOB: Jul 26, 1950, 72 y.o.   MRN: [id number]  Chief Complaint  Patient presents with   Follow-up    HPI Patient is in today for a follow-up visit.    Blood pressure: His blood pressure is elevated during this visit. He is taking 5 mg amlodipine daily PO to manage it. BP Readings from Last 3 Encounters:  08/24/22 (!) 160/88  07/20/22 (!) 156/88  07/04/22 (!) 148/85    Pulse Readings from Last 3 Encounters:  08/24/22 (!) 58  07/20/22 68  07/04/22 (!) 55     Asthma: He still visits a pulmonologist for his asthma. He is taking 10 mg Singulair daily PO and 0.5-2.5 mg ipratropium-albuterol as needed.   Restless leg: He takes 2 mg tizanidine intermittently to manage his restless leg.  Allergies: His allergies have worsened during the spring season. He takes benadryl and zyrtec intermittently to alleviate his allergy symptoms.  Gout: He had a gout flare up a few weeks ago.   Sleep: He wears his CPAP nightly.  Past Medical History:  Diagnosis Date   Allergy    Arthritis    Asthma    Cancer (HCC)    basal cell and squamous cell carcinoma   Cancer (HCC)    prostate   Cataract 2023   Clotting disorder (HCC)    Colon polyps    COPD (chronic obstructive pulmonary disease) (HCC) 1975   GERD (gastroesophageal reflux disease)    Gout    High cholesterol    History of hepatitis    due to drug interaction?   History of pancreatitis 2016   ?due to colchicine   History of pulmonary embolus (PE) 2016   History of stomach ulcers    Hypertension    Hypothyroidism    OSA (obstructive sleep apnea) 2015   has CPAP   Pneumonia    Sleep apnea    Thyroid disease    hypothyroid   Ulcer 1985   Urinary incontinence     Past Surgical History:  Procedure  Laterality Date   BASAL CELL CARCINOMA EXCISION  2017   MOHS.  beneath right eye   BRAIN SURGERY  1967   Fractured skull in an accident   CHOLECYSTECTOMY  2001   COLON SURGERY  2001   Complication of gall bladder surgery   COLONOSCOPY     multiple - last 06/2017   COSMETIC SURGERY  2017   Basal cell cancer - MOHS surgery   HERNIA REPAIR  2001   HIP SURGERY     JOINT REPLACEMENT  06/2016   Total hip replacement - left hip   PENILE PROSTHESIS IMPLANT  2016   PROSTATECTOMY  2009   SINUS SURGERY WITH INSTATRAK  2014   SQUAMOUS CELL CARCINOMA EXCISION  01/2017   nose   TOOTH EXTRACTION     TOTAL HIP ARTHROPLASTY  06/2016    Family History  Problem Relation Age of Onset   Asthma Mother    Arthritis Mother    Skin cancer Mother        basal cell carcinoma   Kidney failure Mother    Atrial fibrillation Mother    Kidney disease Mother    Obesity Mother    Asthma Father    Arthritis  Father    Diabetes Father    Heart attack Father    Hyperlipidemia Father    Hypertension Father    Kidney disease Father    Obesity Father    Diabetes Brother    Arthritis Brother    Hyperlipidemia Brother    Brain cancer Brother        GBM   Cancer Brother    Alcohol abuse Brother    Breast cancer Maternal Grandmother    Cancer Maternal Grandmother    Diabetes Paternal Grandfather    Colon cancer Neg Hx    Esophageal cancer Neg Hx    Liver cancer Neg Hx    Pancreatic cancer Neg Hx    Rectal cancer Neg Hx    Stomach cancer Neg Hx     Social History   Socioeconomic History   Marital status: Married    Spouse name: Not on file   Number of children: 3   Years of education: Not on file   Highest education level: Bachelor's degree (e.g., BA, AB, BS)  Occupational History   Not on file  Tobacco Use   Smoking status: Never   Smokeless tobacco: Never  Vaping Use   Vaping Use: Never used  Substance and Sexual Activity   Alcohol use: Yes    Alcohol/week: 3.0 standard drinks of  alcohol    Types: 3 Glasses of wine per week    Comment: Very limited   Drug use: No   Sexual activity: Yes    Birth control/protection: Surgical    Comment: Prostate removal  Other Topics Concern   Not on file  Social History Narrative   Former Engineer, agricultural at [employer], now Environmental health practitioner.    Married   3 daughters- all live locally   Has 7 grandchildren and 1 great grandchild   Enjoys reading, Environmental manager         Lives in a one story home with a bonus room over the garage.   Social Determinants of Health   Financial Resource Strain: Low Risk  (08/17/2022)   Overall Financial Resource Strain (CARDIA)    Difficulty of Paying Living Expenses: Not hard at all  Food Insecurity: No Food Insecurity (08/17/2022)   Hunger Vital Sign    Worried About Running Out of Food in the Last Year: Never true    Ran Out of Food in the Last Year: Never true  Transportation Needs: No Transportation Needs (08/17/2022)   PRAPARE - Administrator, Civil Service (Medical): No    Lack of Transportation (Non-Medical): No  Physical Activity: Insufficiently Active (08/17/2022)   Exercise Vital Sign    Days of Exercise per Week: 2 days    Minutes of Exercise per Session: 20 min  Stress: Stress Concern Present (08/17/2022)   [employer] of Occupational Health - Occupational Stress Questionnaire    Feeling of Stress : To some extent  Social Connections: Socially Integrated (08/17/2022)   Social Connection and Isolation Panel [NHANES]    Frequency of Communication with Friends and Family: More than three times a week    Frequency of Social Gatherings with Friends and Family: Twice a week    Attends Religious Services: More than 4 times per year    Active Member of [employer] or Organizations: Yes    Attends Banker Meetings: More than 4 times per year    Marital Status: Married  Catering manager Violence: Not At Risk (08/13/2021)   Humiliation, Afraid, Rape, and  Kick questionnaire    Fear of Current or Ex-Partner: No    Emotionally Abused: No    Physically Abused: No    Sexually Abused: No    Outpatient Medications Prior to Visit  Medication Sig Dispense Refill   albuterol (VENTOLIN HFA) 108 (90 Base) MCG/ACT inhaler Inhale 2 puffs into the lungs every 6 (six) hours as needed for wheezing or shortness of breath. 18 g 1   calcium carbonate (OS-CAL - DOSED IN MG OF ELEMENTAL CALCIUM) 1250 (500 Ca) MG tablet Take 1 tablet by mouth daily.     Cholecalciferol (VITAMIN D3) 5000 units CAPS Take by mouth.     docusate sodium (COLACE) 100 MG capsule Take 200 mg by mouth daily.      EPINEPHrine 0.3 mg/0.3 mL IJ SOAJ injection Use as directed for severe allergic reactions 2 each 2   fexofenadine (ALLEGRA) 180 MG tablet Take 180 mg by mouth daily.      fluticasone-salmeterol (WIXELA INHUB) 500-50 MCG/ACT AEPB Inhale 1 puff into the lungs 2 (two) times daily. in the morning and at bedtime. 60 each 5   ipratropium-albuterol (DUONEB) 0.5-2.5 (3) MG/3ML SOLN 1 vial in nebulizer every 4-6 hours while awake for the next 3-5 days for coughing or wheezing. 360 mL 1   Magnesium 250 MG TABS Take by mouth.     montelukast (SINGULAIR) 10 MG tablet Take one tablet once at night for coughing or wheezing 90 tablet 3   tiZANidine (ZANAFLEX) 2 MG tablet TAKE 1 TO 2 TABLETS(2 TO 4 MG) BY MOUTH AT BEDTIME AS NEEDED FOR MUSCLE SPASMS 60 tablet 1   allopurinol (ZYLOPRIM) 100 MG tablet TAKE 1 TABLET(100 MG) BY MOUTH TWICE DAILY 180 tablet 1   amLODipine (NORVASC) 5 MG tablet Take 1.5 tablets (7.5 mg total) by mouth daily. 135 tablet 0   clotrimazole-betamethasone (LOTRISONE) cream Apply 1 application topically 2 (two) times daily. 30 g 0   levothyroxine (SYNTHROID) 75 MCG tablet Take 1 tablet (75 mcg total) by mouth daily before breakfast. 90 tablet 0   No facility-administered medications prior to visit.    Allergies  Allergen Reactions   Baclofen Shortness Of Breath     Asthma exacerbation   Colchicine Other (See Comments)    Pancreatitis   Doxycycline Hives   Statins Other (See Comments)    Joint stiffness   Avelox [Moxifloxacin Hcl In Nacl] Other (See Comments)    SEIZURE   Bactrim [Sulfamethoxazole-Trimethoprim] Hives   Cefdinir Hives   Gabapentin     Eye twitch and difficult focusing his vision   Losartan     Rash    Oregano [Origanum Oil] Cough   Peanut-Containing Drug Products Swelling    Throat swelling   Adhesive  [Tape] Other (See Comments)    "tears skin"   Penicillins Rash    ROS See HPI    Objective:    Physical Exam Constitutional:      General: He is not in acute distress.    Appearance: Normal appearance.  HENT:     Head: Normocephalic and atraumatic.     Right Ear: External ear normal.     Left Ear: External ear normal.  Eyes:     Extraocular Movements: Extraocular movements intact.     Pupils: Pupils are equal, round, and reactive to light.  Cardiovascular:     Rate and Rhythm: Normal rate and regular rhythm.     Heart sounds: Normal heart sounds. No murmur heard.    No gallop.  Comments: Manual bp recheck 144/75 Pulmonary:     Effort: Pulmonary effort is normal. No respiratory distress.     Breath sounds: Normal breath sounds. No wheezing or rales.  Skin:    General: Skin is warm.  Neurological:     Mental Status: He is alert and oriented to person, place, and time.  Psychiatric:        Judgment: Judgment normal.     BP (!) 160/88   Pulse (!) 58   Temp 98 F (36.7 C)   Resp 18   Ht 5\' 6"  (1.676 m)   Wt 230 lb 12.8 oz (104.7 kg)   SpO2 98%   BMI 37.25 kg/m  Wt Readings from Last 3 Encounters:  08/24/22 230 lb 12.8 oz (104.7 kg)  07/20/22 236 lb 9.6 oz (107.3 kg)  07/04/22 235 lb (106.6 kg)       Assessment & Plan:  Primary hypertension Assessment & Plan: Pt is maintianed on 7.5mg  amlodipine.    Chronic gout without tophus, unspecified cause, unspecified site Assessment & Plan: Had a  bad flare of gout a few weeks ago. Continues allopurinol bid. He is unable to take colchicine due to hx of pancreatitis whie taking.    Severe persistent asthma without complication Assessment & Plan: Currently stable on advair, singulair and prn albuterol/duonebs.  Sees Dr. Everardo All, pulmonary.    RLS (restless legs syndrome) Assessment & Plan: Fair control.    Acquired hypothyroidism Assessment & Plan: Lab Results  Component Value Date   TSH 1.70 07/04/2022   Stable on synthroid 75 mcg.    Gout, unspecified cause, unspecified chronicity, unspecified site Assessment & Plan: Had a bad flare of gout a few weeks ago. Continues allopurinol bid. He is unable to take colchicine due to hx of pancreatitis whie taking.   Orders: -     Allopurinol; TAKE 1 TABLET(100 MG) BY MOUTH TWICE DAILY  Dispense: 180 tablet; Refill: 1  Other orders -     amLODIPine Besylate; Take 1 tablet (10 mg total) by mouth daily.  Dispense: 90 tablet; Refill: 1 -     Levothyroxine Sodium; Take 1 tablet (75 mcg total) by mouth daily before breakfast.  Dispense: 90 tablet; Refill: 1   30 minutes spent on today's visit. Time was spent reviewing medical plan with patient and reviewing medical record.  I, Lemont Fillers, NP, personally preformed the services described in this documentation.  All medical record entries made by the scribe were at my direction and in my presence.  I have reviewed the chart and discharge instructions (if applicable) and agree that the record reflects my personal performance and is accurate and complete. 08/24/2022  Lemont Fillers, NP  [facility] as a scribe for Lemont Fillers, NP.,have documented all relevant documentation on the behalf of Lemont Fillers, NP,as directed by  Lemont Fillers, NP while in the presence of Lemont Fillers, NP.

## 2022-08-24 NOTE — Assessment & Plan Note (Signed)
Fair control.

## 2022-08-24 NOTE — Assessment & Plan Note (Addendum)
Pt is maintianed on 7.5mg  amlodipine. Uncontrolled. Will increase to 10mg .

## 2022-08-24 NOTE — Assessment & Plan Note (Signed)
Currently stable on advair, singulair and prn albuterol/duonebs.  Sees Dr. Everardo All, pulmonary.

## 2022-08-25 ENCOUNTER — Ambulatory Visit (INDEPENDENT_AMBULATORY_CARE_PROVIDER_SITE_OTHER): Payer: PPO | Admitting: *Deleted

## 2022-08-25 VITALS — Ht 66.0 in | Wt 230.0 lb

## 2022-08-25 DIAGNOSIS — Z0000 Encounter for general adult medical examination without abnormal findings: Secondary | ICD-10-CM

## 2022-08-25 NOTE — Patient Instructions (Signed)
Michael [name] , Thank you for taking time to come for your Medicare Wellness Visit. I appreciate your ongoing commitment to your health goals. Please review the following plan we discussed and let me know if I can assist you in the future.     This is a list of the screening recommended for you and due dates:  Health Maintenance  Topic Date Due   COVID-19 Vaccine (7 - 2023-24 season) 03/21/2022   Flu Shot  11/24/2022   DTaP/Tdap/Td vaccine (2 - Td or Tdap) 08/16/2023   Colon Cancer Screening  07/11/2027   Pneumonia Vaccine  Completed   Hepatitis C Screening: USPSTF Recommendation to screen - Ages 36-79 yo.  Completed   Zoster (Shingles) Vaccine  Completed   HPV Vaccine  Aged Out    Next appointment: Follow up in one year for your annual wellness visit.   Preventive Care 41 Years and Older, Male Preventive care refers to lifestyle choices and visits with your health care provider that can promote health and wellness. What does preventive care include? A yearly physical exam. This is also called an annual well check. Dental exams once or twice a year. Routine eye exams. Ask your health care provider how often you should have your eyes checked. Personal lifestyle choices, including: Daily care of your teeth and gums. Regular physical activity. Eating a healthy diet. Avoiding tobacco and drug use. Limiting alcohol use. Practicing safe sex. Taking low doses of aspirin every day. Taking vitamin and mineral supplements as recommended by your health care provider. What happens during an annual well check? The services and screenings done by your health care provider during your annual well check will depend on your age, overall health, lifestyle risk factors, and family history of disease. Counseling  Your health care provider may ask you questions about your: Alcohol use. Tobacco use. Drug use. Emotional well-being. Home and relationship well-being. Sexual activity. Eating  habits. History of falls. Memory and ability to understand (cognition). Work and work Astronomer. Screening  You may have the following tests or measurements: Height, weight, and BMI. Blood pressure. Lipid and cholesterol levels. These may be checked every 5 years, or more frequently if you are over 55 years old. Skin check. Lung cancer screening. You may have this screening every year starting at age 52 if you have a 30-pack-year history of smoking and currently smoke or have quit within the past 15 years. Fecal occult blood test (FOBT) of the stool. You may have this test every year starting at age 89. Flexible sigmoidoscopy or colonoscopy. You may have a sigmoidoscopy every 5 years or a colonoscopy every 10 years starting at age 66. Prostate cancer screening. Recommendations will vary depending on your family history and other risks. Hepatitis C blood test. Hepatitis B blood test. Sexually transmitted disease (STD) testing. Diabetes screening. This is done by checking your blood sugar (glucose) after you have not eaten for a while (fasting). You may have this done every 1-3 years. Abdominal aortic aneurysm (AAA) screening. You may need this if you are a current or former smoker. Osteoporosis. You may be screened starting at age 60 if you are at high risk. Talk with your health care provider about your test results, treatment options, and if necessary, the need for more tests. Vaccines  Your health care provider may recommend certain vaccines, such as: Influenza vaccine. This is recommended every year. Tetanus, diphtheria, and acellular pertussis (Tdap, Td) vaccine. You may need a Td booster every 10 years.  Zoster vaccine. You may need this after age 19. Pneumococcal 13-valent conjugate (PCV13) vaccine. One dose is recommended after age 62. Pneumococcal polysaccharide (PPSV23) vaccine. One dose is recommended after age 5. Talk to your health care provider about which screenings and  vaccines you need and how often you need them. This information is not intended to replace advice given to you by your health care provider. Make sure you discuss any questions you have with your health care provider. Document Released: 05/08/2015 Document Revised: 12/30/2015 Document Reviewed: 02/10/2015 Elsevier Interactive Patient Education  2017 [place] Prevention in the Home Falls can cause injuries. They can happen to people of all ages. There are many things you can do to make your home safe and to help prevent falls. What can I do on the outside of my home? Regularly fix the edges of walkways and driveways and fix any cracks. Remove anything that might make you trip as you walk through a door, such as a raised step or threshold. Trim any bushes or trees on the path to your home. Use bright outdoor lighting. Clear any walking paths of anything that might make someone trip, such as rocks or tools. Regularly check to see if handrails are loose or broken. Make sure that both sides of any steps have handrails. Any raised decks and porches should have guardrails on the edges. Have any leaves, snow, or ice cleared regularly. Use sand or salt on walking paths during winter. Clean up any spills in your garage right away. This includes oil or grease spills. What can I do in the bathroom? Use night lights. Install grab bars by the toilet and in the tub and shower. Do not use towel bars as grab bars. Use non-skid mats or decals in the tub or shower. If you need to sit down in the shower, use a plastic, non-slip stool. Keep the floor dry. Clean up any water that spills on the floor as soon as it happens. Remove soap buildup in the tub or shower regularly. Attach bath mats securely with double-sided non-slip rug tape. Do not have throw rugs and other things on the floor that can make you trip. What can I do in the bedroom? Use night lights. Make sure that you have a light by your  bed that is easy to reach. Do not use any sheets or blankets that are too big for your bed. They should not hang down onto the floor. Have a firm chair that has side arms. You can use this for support while you get dressed. Do not have throw rugs and other things on the floor that can make you trip. What can I do in the kitchen? Clean up any spills right away. Avoid walking on wet floors. Keep items that you use a lot in easy-to-reach places. If you need to reach something above you, use a strong step stool that has a grab bar. Keep electrical cords out of the way. Do not use floor polish or wax that makes floors slippery. If you must use wax, use non-skid floor wax. Do not have throw rugs and other things on the floor that can make you trip. What can I do with my stairs? Do not leave any items on the stairs. Make sure that there are handrails on both sides of the stairs and use them. Fix handrails that are broken or loose. Make sure that handrails are as long as the stairways. Check any carpeting to make sure that  it is firmly attached to the stairs. Fix any carpet that is loose or worn. Avoid having throw rugs at the top or bottom of the stairs. If you do have throw rugs, attach them to the floor with carpet tape. Make sure that you have a light switch at the top of the stairs and the bottom of the stairs. If you do not have them, ask someone to add them for you. What else can I do to help prevent falls? Wear shoes that: Do not have high heels. Have rubber bottoms. Are comfortable and fit you well. Are closed at the toe. Do not wear sandals. If you use a stepladder: Make sure that it is fully opened. Do not climb a closed stepladder. Make sure that both sides of the stepladder are locked into place. Ask someone to hold it for you, if possible. Clearly mark and make sure that you can see: Any grab bars or handrails. First and last steps. Where the edge of each step is. Use tools that  help you move around (mobility aids) if they are needed. These include: Canes. Walkers. Scooters. Crutches. Turn on the lights when you go into a dark area. Replace any light bulbs as soon as they burn out. Set up your furniture so you have a clear path. Avoid moving your furniture around. If any of your floors are uneven, fix them. If there are any pets around you, be aware of where they are. Review your medicines with your doctor. Some medicines can make you feel dizzy. This can increase your chance of falling. Ask your doctor what other things that you can do to help prevent falls. This information is not intended to replace advice given to you by your health care provider. Make sure you discuss any questions you have with your health care provider. Document Released: 02/05/2009 Document Revised: 09/17/2015 Document Reviewed: 05/16/2014 Elsevier Interactive Patient Education  2017 [employer].

## 2022-08-25 NOTE — Progress Notes (Signed)
Subjective:  Pt completed ADLs, Fall risk, and SDOH during e-check in on 08/15/22.  Answers verified with pt.    Michael [name] is a 72 y.o. male who presents for Medicare Annual/Subsequent preventive examination.  I connected with  Michael [name] on 08/25/22 by a audio enabled telemedicine application and verified that I am speaking with the correct person using two identifiers.  Patient Location: Home  Provider Location: Office/Clinic  I discussed the limitations of evaluation and management by telemedicine. The patient expressed understanding and agreed to proceed.   Review of Systems     Cardiac Risk Factors include: male gender;advanced age (>35men, >12 women);dyslipidemia;hypertension;obesity (BMI >30kg/m2)     Objective:    Today's Vitals   08/25/22 1534  Weight: 230 lb (104.3 kg)  Height: 5\' 6"  (1.676 m)   Body mass index is 37.12 kg/m.     08/25/2022    3:42 PM 08/13/2021    3:42 PM 11/16/2020    9:04 AM 01/01/2020    8:03 AM 09/25/2019    8:02 AM 06/09/2018    9:04 PM  Advanced Directives  Does Patient Have a Medical Advance Directive? Yes Yes Yes Yes Yes No  Type of Estate agent of [place];Living will Healthcare Power of [place];Living will Healthcare Power of [place];Living will;Out of facility DNR (pink MOST or yellow form) Healthcare Power of [place];Living will    Does patient want to make changes to medical advance directive?    No - Patient declined No - Patient declined   Copy of Healthcare Power of Attorney in Chart? No - copy requested No - copy requested  No - copy requested      Current Medications (verified) Outpatient Encounter Medications as of 08/25/2022  Medication Sig   albuterol (VENTOLIN HFA) 108 (90 Base) MCG/ACT inhaler Inhale 2 puffs into the lungs every 6 (six) hours as needed for wheezing or shortness of breath.   allopurinol (ZYLOPRIM) 100 MG tablet TAKE 1 TABLET(100 MG) BY MOUTH TWICE DAILY    amLODipine (NORVASC) 10 MG tablet Take 1 tablet (10 mg total) by mouth daily.   calcium carbonate (OS-CAL - DOSED IN MG OF ELEMENTAL CALCIUM) 1250 (500 Ca) MG tablet Take 1 tablet by mouth daily.   Cholecalciferol (VITAMIN D3) 5000 units CAPS Take by mouth.   docusate sodium (COLACE) 100 MG capsule Take 200 mg by mouth daily.    EPINEPHrine 0.3 mg/0.3 mL IJ SOAJ injection Use as directed for severe allergic reactions   fexofenadine (ALLEGRA) 180 MG tablet Take 180 mg by mouth daily.    fluticasone-salmeterol (WIXELA INHUB) 500-50 MCG/ACT AEPB Inhale 1 puff into the lungs 2 (two) times daily. in the morning and at bedtime.   ipratropium-albuterol (DUONEB) 0.5-2.5 (3) MG/3ML SOLN 1 vial in nebulizer every 4-6 hours while awake for the next 3-5 days for coughing or wheezing.   levothyroxine (SYNTHROID) 75 MCG tablet Take 1 tablet (75 mcg total) by mouth daily before breakfast.   Magnesium 250 MG TABS Take by mouth.   montelukast (SINGULAIR) 10 MG tablet Take one tablet once at night for coughing or wheezing   tiZANidine (ZANAFLEX) 2 MG tablet TAKE 1 TO 2 TABLETS(2 TO 4 MG) BY MOUTH AT BEDTIME AS NEEDED FOR MUSCLE SPASMS   No facility-administered encounter medications on file as of 08/25/2022.    Allergies (verified) Baclofen, Colchicine, Doxycycline, Statins, Avelox [moxifloxacin hcl in nacl], Bactrim [sulfamethoxazole-trimethoprim], Cefdinir, Gabapentin, Losartan, Oregano [origanum oil], Peanut-containing drug products, Adhesive  [tape], and  Penicillins   History: Past Medical History:  Diagnosis Date   Allergy    Arthritis    Asthma    Cancer (HCC)    basal cell and squamous cell carcinoma   Cancer (HCC)    prostate   Cataract 2023   Clotting disorder (HCC)    Colon polyps    COPD (chronic obstructive pulmonary disease) (HCC) 1975   GERD (gastroesophageal reflux disease)    Gout    High cholesterol    History of hepatitis    due to drug interaction?   History of pancreatitis 2016    ?due to colchicine   History of pulmonary embolus (PE) 2016   History of stomach ulcers    Hypertension    Hypothyroidism    OSA (obstructive sleep apnea) 2015   has CPAP   Pneumonia    Sleep apnea    Thyroid disease    hypothyroid   Ulcer 1985   Urinary incontinence    Past Surgical History:  Procedure Laterality Date   BASAL CELL CARCINOMA EXCISION  2017   MOHS.  beneath right eye   BRAIN SURGERY  1967   Fractured skull in an accident   CHOLECYSTECTOMY  2001   COLON SURGERY  2001   Complication of gall bladder surgery   COLONOSCOPY     multiple - last 06/2017   COSMETIC SURGERY  2017   Basal cell cancer - MOHS surgery   HERNIA REPAIR  2001   HIP SURGERY     JOINT REPLACEMENT  06/2016   Total hip replacement - left hip   PENILE PROSTHESIS IMPLANT  2016   PROSTATECTOMY  2009   SINUS SURGERY WITH INSTATRAK  2014   SQUAMOUS CELL CARCINOMA EXCISION  01/2017   nose   TOOTH EXTRACTION     TOTAL HIP ARTHROPLASTY  06/2016   Family History  Problem Relation Age of Onset   Asthma Mother    Arthritis Mother    Skin cancer Mother        basal cell carcinoma   Kidney failure Mother    Atrial fibrillation Mother    Kidney disease Mother    Obesity Mother    Asthma Father    Arthritis Father    Diabetes Father    Heart attack Father    Hyperlipidemia Father    Hypertension Father    Kidney disease Father    Obesity Father    Diabetes Brother    Arthritis Brother    Hyperlipidemia Brother    Brain cancer Brother        GBM   Cancer Brother    Alcohol abuse Brother    Breast cancer Maternal Grandmother    Cancer Maternal Grandmother    Diabetes Paternal Grandfather    Colon cancer Neg Hx    Esophageal cancer Neg Hx    Liver cancer Neg Hx    Pancreatic cancer Neg Hx    Rectal cancer Neg Hx    Stomach cancer Neg Hx    Social History   Socioeconomic History   Marital status: Married    Spouse name: Not on file   Number of children: 3   Years of education:  Not on file   Highest education level: Bachelor's degree (e.g., BA, AB, BS)  Occupational History   Not on file  Tobacco Use   Smoking status: Never   Smokeless tobacco: Never  Vaping Use   Vaping Use: Never used  Substance and Sexual Activity  Alcohol use: Yes    Alcohol/week: 3.0 standard drinks of alcohol    Types: 3 Glasses of wine per week    Comment: Very limited   Drug use: No   Sexual activity: Yes    Birth control/protection: Surgical    Comment: Prostate removal  Other Topics Concern   Not on file  Social History Narrative   Former Engineer, agricultural at [employer], now Environmental health practitioner.    Married   3 daughters- all live locally   Has 7 grandchildren and 1 great grandchild   Enjoys reading, Environmental manager         Lives in a one story home with a bonus room over the garage.   Social Determinants of Health   Financial Resource Strain: Low Risk  (08/17/2022)   Overall Financial Resource Strain (CARDIA)    Difficulty of Paying Living Expenses: Not hard at all  Food Insecurity: No Food Insecurity (08/17/2022)   Hunger Vital Sign    Worried About Running Out of Food in the Last Year: Never true    Ran Out of Food in the Last Year: Never true  Transportation Needs: No Transportation Needs (08/17/2022)   PRAPARE - Administrator, Civil Service (Medical): No    Lack of Transportation (Non-Medical): No  Physical Activity: Insufficiently Active (08/17/2022)   Exercise Vital Sign    Days of Exercise per Week: 2 days    Minutes of Exercise per Session: 20 min  Stress: Stress Concern Present (08/17/2022)   [employer] of Occupational Health - Occupational Stress Questionnaire    Feeling of Stress : To some extent  Social Connections: Socially Integrated (08/17/2022)   Social Connection and Isolation Panel [NHANES]    Frequency of Communication with Friends and Family: More than three times a week    Frequency of Social Gatherings with Friends and  Family: Twice a week    Attends Religious Services: More than 4 times per year    Active Member of [employer] or Organizations: Yes    Attends Engineer, structural: More than 4 times per year    Marital Status: Married    Tobacco Counseling Counseling given: Not Answered   Clinical Intake:  Pre-visit preparation completed: Yes  Pain : No/denies pain  BMI - recorded: 37.12 Nutritional Status: BMI > 30  Obese Nutritional Risks: None Diabetes: No  How often do you need to have someone help you when you read instructions, pamphlets, or other written materials from your doctor or pharmacy?: 1 - Never   Activities of Daily Living    08/15/2022    4:42 PM  In your present state of health, do you have any difficulty performing the following activities:  Hearing? 0  Vision? 0  Difficulty concentrating or making decisions? 0  Walking or climbing stairs? 0  Dressing or bathing? 0  Doing errands, shopping? 0  Preparing Food and eating ? N  Using the Toilet? N  In the past six months, have you accidently leaked urine? N  Do you have problems with loss of bowel control? N  Managing your Medications? N  Managing your Finances? N  Housekeeping or managing your Housekeeping? N    Patient Care Team: Sandford Craze, NP as PCP - General (Internal Medicine) Larence Penning, OD as Referring Physician (Optometry) Hughie Closs., MD (General Practice) Esaw Dace, MD as Attending Physician (Urology) Tat, Octaviano Batty, DO as Consulting Physician (Neurology)  Indicate any recent Medical Services you  may have received from other than [facility] providers in the past year (date may be approximate).     Assessment:   This is a routine wellness examination for Michael [name].  Hearing/Vision screen No results found.  Dietary issues and exercise activities discussed: Current Exercise Habits: Home exercise routine, Type of exercise: walking, Time (Minutes): 30, Frequency (Times/Week): 4,  Weekly Exercise (Minutes/Week): 120, Intensity: Mild, Exercise limited by: None identified   Goals Addressed   None    Depression Screen    08/25/2022    3:45 PM 08/13/2021    3:35 PM 10/21/2020   11:08 AM 07/03/2020    7:19 AM 06/23/2017    9:07 AM  PHQ 2/9 Scores  PHQ - 2 Score 0 0 0 0 0    Fall Risk    08/15/2022    4:42 PM 08/13/2021    3:44 PM 11/16/2020    9:04 AM 10/21/2020   11:08 AM 12/18/2019    7:38 AM  Fall Risk   Falls in the past year? 0 0 0 0 0  Number falls in past yr: 0 0 0 0   Injury with Fall? 0 0 0 0   Risk for fall due to : No Fall Risks No Fall Risks     Follow up Falls evaluation completed Falls prevention discussed       FALL RISK PREVENTION PERTAINING TO THE HOME:  Any stairs in or around the home? Yes  If so, are there any without handrails? No  Home free of loose throw rugs in walkways, pet beds, electrical cords, etc? Yes  Adequate lighting in your home to reduce risk of falls? Yes   ASSISTIVE DEVICES UTILIZED TO PREVENT FALLS:  Life alert? No  Use of a cane, walker or w/c? No  Grab bars in the bathroom? No  Shower chair or bench in shower?  Built in seat Elevated toilet seat or a handicapped toilet?  Comfort height  TIMED UP AND GO:  Was the test performed?  No, audio visit .   Cognitive Function:        08/25/2022    3:57 PM 08/13/2021    3:48 PM  6CIT Screen  What Year? 0 points 0 points  What month? 0 points 0 points  What time? 0 points 0 points  Count back from 20 0 points 0 points  Months in reverse 0 points 0 points  Repeat phrase 0 points 0 points  Total Score 0 points 0 points    Immunizations Immunization History  Administered Date(s) Administered   Fluad Quad(high Dose 65+) 01/29/2019, 07/03/2020, 01/12/2021   Influenza Split 04/08/2013   Influenza, High Dose Seasonal PF 01/19/2018   Influenza, Quadrivalent, Recombinant, Inj, Pf 01/13/2017   Influenza-Unspecified 01/21/2014, 01/20/2015, 01/21/2016, 12/10/2021,  01/24/2022   PFIZER Comirnaty(Gray Top)Covid-19 Tri-Sucrose Vaccine 10/09/2020   PFIZER(Purple Top)SARS-COV-2 Vaccination 06/06/2019, 07/01/2019, 03/03/2020, 01/24/2022   Pfizer Covid-19 Vaccine Bivalent Booster 81yrs & up 01/20/2021   Pneumococcal Conjugate-13 08/15/2013, 11/16/2018   Pneumococcal Polysaccharide-23 11/01/2013, 12/18/2019   Respiratory Syncytial Virus Vaccine,Recomb Aduvanted(Arexvy) 02/07/2022   Tdap 08/15/2013   Zoster Recombinat (Shingrix) 01/19/2018, 06/15/2018   Zoster, Live 05/14/2015    TDAP status: Up to date  Flu Vaccine status: Up to date  Pneumococcal vaccine status: Up to date  Covid-19 vaccine status: Information provided on how to obtain vaccines.   Qualifies for Shingles Vaccine? Yes   Zostavax completed Yes   Shingrix Completed?: Yes  Screening Tests Health Maintenance  Topic Date Due  COVID-19 Vaccine (7 - 2023-24 season) 03/21/2022   INFLUENZA VACCINE  11/24/2022   DTaP/Tdap/Td (2 - Td or Tdap) 08/16/2023   COLONOSCOPY (Pts 45-8yrs Insurance coverage will need to be confirmed)  07/11/2027   Pneumonia Vaccine 86+ Years old  Completed   Hepatitis C Screening  Completed   Zoster Vaccines- Shingrix  Completed   HPV VACCINES  Aged Out    Health Maintenance  Health Maintenance Due  Topic Date Due   COVID-19 Vaccine (7 - 2023-24 season) 03/21/2022    Colorectal cancer screening: Type of screening: Colonoscopy. Completed 07/10/17. Repeat every 5 years Pt scheduled for 10/05/22  Lung Cancer Screening: (Low Dose CT Chest recommended if Age 1-80 years, 30 pack-year currently smoking OR have quit w/in 15years.) does not qualify.   Additional Screening:  Hepatitis C Screening: does qualify; Completed 03/24/17  Vision Screening: Recommended annual ophthalmology exams for early detection of glaucoma and other disorders of the eye. Is the patient up to date with their annual eye exam?  Yes  Who is the provider or what is the name of the office  in which the patient attends annual eye exams? [facility] If pt is not established with a provider, would they like to be referred to a provider to establish care? No .   Dental Screening: Recommended annual dental exams for proper oral hygiene  Community Resource Referral / Chronic Care Management: CRR required this visit?  No   CCM required this visit?  No      Plan:     I have personally reviewed and noted the following in the patient's chart:   Medical and social history Use of alcohol, tobacco or illicit drugs  Current medications and supplements including opioid prescriptions. Patient is not currently taking opioid prescriptions. Functional ability and status Nutritional status Physical activity Advanced directives List of other physicians Hospitalizations, surgeries, and ER visits in previous 12 months Vitals Screenings to include cognitive, depression, and falls Referrals and appointments  In addition, I have reviewed and discussed with patient certain preventive protocols, quality metrics, and best practice recommendations. A written personalized care plan for preventive services as well as general preventive health recommendations were provided to patient.   Due to this being a telephonic visit, the after visit summary with patients personalized plan was offered to patient via mail or my-chart. Patient would like to access on my-chart.  Donne Anon, [place]   08/25/2022   Nurse Notes: None

## 2022-08-29 DIAGNOSIS — G4733 Obstructive sleep apnea (adult) (pediatric): Secondary | ICD-10-CM | POA: Diagnosis not present

## 2022-09-05 ENCOUNTER — Encounter: Payer: Self-pay | Admitting: Family

## 2022-09-05 DIAGNOSIS — I10 Essential (primary) hypertension: Secondary | ICD-10-CM

## 2022-09-06 DIAGNOSIS — L821 Other seborrheic keratosis: Secondary | ICD-10-CM | POA: Diagnosis not present

## 2022-09-06 DIAGNOSIS — I872 Venous insufficiency (chronic) (peripheral): Secondary | ICD-10-CM | POA: Diagnosis not present

## 2022-09-06 DIAGNOSIS — L570 Actinic keratosis: Secondary | ICD-10-CM | POA: Diagnosis not present

## 2022-09-06 DIAGNOSIS — D225 Melanocytic nevi of trunk: Secondary | ICD-10-CM | POA: Diagnosis not present

## 2022-09-06 DIAGNOSIS — L820 Inflamed seborrheic keratosis: Secondary | ICD-10-CM | POA: Diagnosis not present

## 2022-09-06 DIAGNOSIS — H61001 Unspecified perichondritis of right external ear: Secondary | ICD-10-CM | POA: Diagnosis not present

## 2022-09-07 ENCOUNTER — Ambulatory Visit (AMBULATORY_SURGERY_CENTER): Payer: PPO | Admitting: *Deleted

## 2022-09-07 VITALS — Ht 66.0 in | Wt 225.0 lb

## 2022-09-07 DIAGNOSIS — Z86010 Personal history of colonic polyps: Secondary | ICD-10-CM

## 2022-09-07 NOTE — Progress Notes (Signed)
No egg or soy allergy known to patient  No issues known to pt with past sedation with any surgeries or procedures Patient denies ever being told they had issues or difficulty with intubation  No FH of Malignant Hyperthermia Pt is not on diet pills Pt is not on  home 02  Pt is not on blood thinners  Pt denies issues with constipation  No A fib or A flutter Have any cardiac testing pending--yes calcium study,not scheduled yet Pt instructed to use [email or web address].com or GoodRx for a price reduction on prep     Sample sheet of over the counter items to purchase for prep mailed with packet. Independent with mobility

## 2022-09-21 ENCOUNTER — Ambulatory Visit (HOSPITAL_BASED_OUTPATIENT_CLINIC_OR_DEPARTMENT_OTHER)
Admission: RE | Admit: 2022-09-21 | Discharge: 2022-09-21 | Disposition: A | Discharge status: Home / Self Care | Payer: PPO | Source: Ambulatory Visit | Attending: Family | Admitting: Family

## 2022-09-21 DIAGNOSIS — I10 Essential (primary) hypertension: Secondary | ICD-10-CM | POA: Insufficient documentation

## 2022-09-26 ENCOUNTER — Ambulatory Visit (INDEPENDENT_AMBULATORY_CARE_PROVIDER_SITE_OTHER): Payer: PPO | Admitting: Family

## 2022-09-26 ENCOUNTER — Encounter: Payer: Self-pay | Admitting: Internal Medicine

## 2022-09-26 VITALS — BP 140/68 | HR 59 | Temp 97.7°F | Resp 16 | Wt 234.0 lb

## 2022-09-26 DIAGNOSIS — J4550 Severe persistent asthma, uncomplicated: Secondary | ICD-10-CM | POA: Diagnosis not present

## 2022-09-26 DIAGNOSIS — I10 Essential (primary) hypertension: Secondary | ICD-10-CM | POA: Diagnosis not present

## 2022-09-26 MED ORDER — HYDRALAZINE HCL 25 MG PO TABS: ORAL_TABLET | ORAL | 3 refills | Status: DC

## 2022-09-26 NOTE — Assessment & Plan Note (Signed)
Reports of tightness in chest, no wheezing heard on examination. Currently on Singulair and Advair 500/50, and regularly seeing a pulmonologist.  Advised pt to continue his follow up with pulmonology.

## 2022-09-26 NOTE — Progress Notes (Signed)
Subjective:     Patient ID: Michael [name], male    DOB: 09-19-1950, 71 y.o.   MRN: [id number]  Chief Complaint  Patient presents with   Hypertension    Here for follow up     Hypertension    Patient is in today for follow up.  Discussed the use of AI scribe software for clinical note transcription with the patient, who gave verbal consent to proceed.  History of Present Illness   The patient, with a history of hypertension, gout, and asthma, presents for a follow-up visit after a recent increase in amlodipine from 7.5 to 10 mg. He reports that his home blood pressure readings have improved, but remain slightly elevated, averaging around 140s over 70 to 80. He notes that his blood pressure has decreased by about 10 points since the medication adjustment. He has been trying to increase his physical activity, but notes that his job has become more stressful recently.  The patient also mentions a history of tinnitus, which he notices becomes more pronounced when his blood pressure is high. He has a history of multiple medication allergies and adverse reactions, including a rash with losartan and a cough with lisinopril. He also has a history of gout, which limits the use of diuretic medications and asthma which limits use of beta blockers.   In addition to hypertension, the patient has a history of asthma. He reports that his asthma symptoms have been worse recently, describing his breathing as "tight." He is currently on Singulair and Advair for asthma management and is also followed by a pulmonologist.        Health Maintenance Due  Topic Date Due   COVID-19 Vaccine (7 - 2023-24 season) 03/21/2022    Past Medical History:  Diagnosis Date   Allergy    Arthritis    Asthma    Blood transfusion without reported diagnosis    "as a child"   Cancer (HCC)    basal cell and squamous cell carcinoma   Cancer (HCC)    prostate   Cataract 2023   Clotting disorder (HCC)     Colon polyps    COPD (chronic obstructive pulmonary disease) (HCC) 1975   GERD (gastroesophageal reflux disease)    Gout    High cholesterol    History of hepatitis    due to drug interaction?   History of pancreatitis 2016   ?due to colchicine   History of pulmonary embolus (PE) 2016   History of stomach ulcers    Hypertension    Hypothyroidism    Neuromuscular disorder (HCC)    restless leg   OSA (obstructive sleep apnea) 2015   has CPAP   Osteopenia    Pneumonia    Seizures (HCC)    x1 10 years ago   Sleep apnea    Thyroid disease    hypothyroid   Ulcer 1985   Urinary incontinence     Past Surgical History:  Procedure Laterality Date   BASAL CELL CARCINOMA EXCISION  2017   MOHS.  beneath right eye   BRAIN SURGERY  1967   Fractured skull in an accident   CHOLECYSTECTOMY  2001   COLON SURGERY  2001   Complication of gall bladder surgery   COLONOSCOPY     multiple - last 06/2017   COSMETIC SURGERY  2017   Basal cell cancer - MOHS surgery   HERNIA REPAIR  2001   HIP SURGERY     JOINT REPLACEMENT  06/2016  Total hip replacement - left hip   PENILE PROSTHESIS IMPLANT  2016   PROSTATECTOMY  2009   SINUS SURGERY WITH INSTATRAK  2014   SQUAMOUS CELL CARCINOMA EXCISION  01/2017   nose   TOOTH EXTRACTION     TOTAL HIP ARTHROPLASTY  06/2016    Family History  Problem Relation Age of Onset   Ulcerative colitis Mother    Colon polyps Mother    Asthma Mother    Arthritis Mother    Skin cancer Mother        basal cell carcinoma   Kidney failure Mother    Atrial fibrillation Mother    Kidney disease Mother    Obesity Mother    Asthma Father    Arthritis Father    Diabetes Father    Heart attack Father    Hyperlipidemia Father    Hypertension Father    Kidney disease Father    Obesity Father    Diabetes Brother    Arthritis Brother    Hyperlipidemia Brother    Brain cancer Brother        GBM   Cancer Brother    Alcohol abuse Brother    Breast cancer  Maternal Grandmother    Cancer Maternal Grandmother    Diabetes Paternal Grandfather    Colon cancer Neg Hx    Esophageal cancer Neg Hx    Liver cancer Neg Hx    Pancreatic cancer Neg Hx    Rectal cancer Neg Hx    Stomach cancer Neg Hx    Crohn's disease Neg Hx     Social History   Socioeconomic History   Marital status: Married    Spouse name: Not on file   Number of children: 3   Years of education: Not on file   Highest education level: Bachelor's degree (e.g., BA, AB, BS)  Occupational History   Not on file  Tobacco Use   Smoking status: Never   Smokeless tobacco: Never  Vaping Use   Vaping Use: Never used  Substance and Sexual Activity   Alcohol use: Yes    Alcohol/week: 3.0 standard drinks of alcohol    Types: 3 Glasses of wine per week    Comment: occ wine   Drug use: No   Sexual activity: Yes    Birth control/protection: Surgical    Comment: Prostate removal  Other Topics Concern   Not on file  Social History Narrative   Former Engineer, agricultural at [employer], now Environmental health practitioner.    Married   3 daughters- all live locally   Has 7 grandchildren and 1 great grandchild   Enjoys reading, Environmental manager         Lives in a one story home with a bonus room over the garage.   Social Determinants of Health   Financial Resource Strain: Low Risk  (08/17/2022)   Overall Financial Resource Strain (CARDIA)    Difficulty of Paying Living Expenses: Not hard at all  Food Insecurity: No Food Insecurity (08/17/2022)   Hunger Vital Sign    Worried About Running Out of Food in the Last Year: Never true    Ran Out of Food in the Last Year: Never true  Transportation Needs: No Transportation Needs (08/17/2022)   PRAPARE - Administrator, Civil Service (Medical): No    Lack of Transportation (Non-Medical): No  Physical Activity: Insufficiently Active (08/17/2022)   Exercise Vital Sign    Days of Exercise per Week: 2 days  Minutes of Exercise per  Session: 20 min  Stress: Stress Concern Present (08/17/2022)   [employer] of Occupational Health - Occupational Stress Questionnaire    Feeling of Stress : To some extent  Social Connections: Socially Integrated (08/17/2022)   Social Connection and Isolation Panel [NHANES]    Frequency of Communication with Friends and Family: More than three times a week    Frequency of Social Gatherings with Friends and Family: Twice a week    Attends Religious Services: More than 4 times per year    Active Member of [employer] or Organizations: Yes    Attends Engineer, structural: More than 4 times per year    Marital Status: Married  Catering manager Violence: Not At Risk (08/25/2022)   Humiliation, Afraid, Rape, and Kick questionnaire    Fear of Current or Ex-Partner: No    Emotionally Abused: No    Physically Abused: No    Sexually Abused: No    Outpatient Medications Prior to Visit  Medication Sig Dispense Refill   albuterol (VENTOLIN HFA) 108 (90 Base) MCG/ACT inhaler Inhale 2 puffs into the lungs every 6 (six) hours as needed for wheezing or shortness of breath. 18 g 1   allopurinol (ZYLOPRIM) 100 MG tablet TAKE 1 TABLET(100 MG) BY MOUTH TWICE DAILY 180 tablet 1   amLODipine (NORVASC) 10 MG tablet Take 1 tablet (10 mg total) by mouth daily. 90 tablet 1   calcium carbonate (OS-CAL - DOSED IN MG OF ELEMENTAL CALCIUM) 1250 (500 Ca) MG tablet Take 1 tablet by mouth daily.     Cholecalciferol (VITAMIN D3) 5000 units CAPS Take by mouth daily.     docusate sodium (COLACE) 100 MG capsule Take 200 mg by mouth daily.      EPINEPHrine 0.3 mg/0.3 mL IJ SOAJ injection Use as directed for severe allergic reactions 2 each 2   fexofenadine (ALLEGRA) 180 MG tablet Take 180 mg by mouth daily.      fluticasone-salmeterol (WIXELA INHUB) 500-50 MCG/ACT AEPB Inhale 1 puff into the lungs 2 (two) times daily. in the morning and at bedtime. 60 each 5   ipratropium-albuterol (DUONEB) 0.5-2.5 (3) MG/3ML SOLN 1  vial in nebulizer every 4-6 hours while awake for the next 3-5 days for coughing or wheezing. 360 mL 1   levothyroxine (SYNTHROID) 75 MCG tablet Take 1 tablet (75 mcg total) by mouth daily before breakfast. 90 tablet 1   Magnesium 250 MG TABS Take by mouth daily.     montelukast (SINGULAIR) 10 MG tablet Take one tablet once at night for coughing or wheezing 90 tablet 3   tiZANidine (ZANAFLEX) 2 MG tablet TAKE 1 TO 2 TABLETS(2 TO 4 MG) BY MOUTH AT BEDTIME AS NEEDED FOR MUSCLE SPASMS 60 tablet 1   No facility-administered medications prior to visit.    Allergies  Allergen Reactions   Baclofen Shortness Of Breath    Asthma exacerbation   Colchicine Other (See Comments)    Pancreatitis   Doxycycline Hives   Statins Other (See Comments)    Joint stiffness   Avelox [Moxifloxacin Hcl In Nacl] Other (See Comments)    SEIZURE   Bactrim [Sulfamethoxazole-Trimethoprim] Hives   Cefdinir Hives   Gabapentin     Eye twitch and difficult focusing his vision   Losartan     Rash    Oregano [Origanum Oil] Cough   Peanut-Containing Drug Products Swelling    Throat swelling   Adhesive  [Tape] Other (See Comments)    "tears skin"  Penicillins Rash    ROS See HPI    Objective:    Physical Exam Constitutional:      General: He is not in acute distress.    Appearance: He is well-developed.  HENT:     Head: Normocephalic and atraumatic.  Cardiovascular:     Rate and Rhythm: Normal rate and regular rhythm.     Heart sounds: No murmur heard. Pulmonary:     Effort: Pulmonary effort is normal. No respiratory distress.     Breath sounds: Examination of the right-lower field reveals decreased breath sounds. Examination of the left-lower field reveals decreased breath sounds. Decreased breath sounds present. No wheezing or rales.  Skin:    General: Skin is warm and dry.  Neurological:     Mental Status: He is alert and oriented to person, place, and time.  Psychiatric:        Behavior:  Behavior normal.        Thought Content: Thought content normal.     BP (!) 140/68   Pulse (!) 59   Temp 97.7 F (36.5 C) (Oral)   Resp 16   Wt 234 lb (106.1 kg)   SpO2 99%   BMI 37.77 kg/m  Wt Readings from Last 3 Encounters:  09/26/22 234 lb (106.1 kg)  09/07/22 225 lb (102.1 kg)  08/25/22 230 lb (104.3 kg)       Assessment & Plan:   Problem List Items Addressed This Visit       Unprioritized   Severe persistent asthma    Reports of tightness in chest, no wheezing heard on examination. Currently on Singulair and Advair 500/50, and regularly seeing a pulmonologist.  Advised pt to continue his follow up with pulmonology.       Hypertension - Primary    Uncontrolled, despite increasing Amlodipine to 10mg .  Blood pressure readings remain slightly elevated (140s/70-80). Discussed medication options considering patient's multiple allergies and contraindications. -Start Hydralazine 12.5mg  twice daily for one week, then increase to 25mg  twice daily. -Check blood pressure at home and follow up in one month.      Relevant Medications   hydrALAZINE (APRESOLINE) 25 MG tablet    I am having Lesia Hausen. Sahm "Daphine Deutscher" start on hydrALAZINE. I am also having him maintain his Vitamin D3, docusate sodium, fexofenadine, Magnesium, calcium carbonate, albuterol, ipratropium-albuterol, tiZANidine, fluticasone-salmeterol, montelukast, EPINEPHrine, amLODipine, allopurinol, and levothyroxine.  Meds ordered this encounter  Medications   hydrALAZINE (APRESOLINE) 25 MG tablet    Sig: 1/2 tab twice daily for 1 week then increase to full tab twice daily.    Dispense:  60 tablet    Refill:  3    Order Specific Question:   Supervising Provider    Answer:   Danise Edge A [[id number]]

## 2022-09-26 NOTE — Assessment & Plan Note (Signed)
Uncontrolled, despite increasing Amlodipine to 10mg .  Blood pressure readings remain slightly elevated (140s/70-80). Discussed medication options considering patient's multiple allergies and contraindications. -Start Hydralazine 12.5mg  twice daily for one week, then increase to 25mg  twice daily. -Check blood pressure at home and follow up in one month.

## 2022-09-28 ENCOUNTER — Telehealth: Payer: Self-pay | Admitting: *Deleted

## 2022-09-28 ENCOUNTER — Encounter: Payer: Self-pay | Admitting: *Deleted

## 2022-09-28 ENCOUNTER — Other Ambulatory Visit: Payer: Self-pay | Admitting: Internal Medicine

## 2022-09-28 DIAGNOSIS — T884XXA Failed or difficult intubation, initial encounter: Secondary | ICD-10-CM | POA: Insufficient documentation

## 2022-09-28 DIAGNOSIS — Z86010 Personal history of colonic polyps: Secondary | ICD-10-CM

## 2022-09-28 HISTORY — DX: Failed or difficult intubation, initial encounter: T88.4XXA

## 2022-09-28 NOTE — Telephone Encounter (Signed)
I spoke to Mr Kokal and I explained the situation to him. [place] so he can't do July 15th. He will be back August 6th so I will put him on for August 15th which he agreed's with. I have got him set up for 12/08/2022 at 8:30AM at [facility], arrive at 7:00AM. Per Daphine Deutscher I have emailed his instructions to him, confirmed email prior to sending. He will call with any questions that he has.

## 2022-09-28 NOTE — Telephone Encounter (Signed)
Office please call patient and explain that in 2015 they needed to use special techniques to intubate him for a surgery and because of that and safety precautions his procedure needs to be done at the hospital.  I do not have any availability till July 15.  Hopefully that is acceptable for him.  If not we can see if another provider has an opening and we can try to schedule with them.

## 2022-09-28 NOTE — Telephone Encounter (Signed)
Dr. Leone Payor,  This pt is scheduled with you on June 12th.  He is a documented difficult intubation and his procedure will need to be done at the hospital.   Thanks,  Cathlyn Parsons

## 2022-09-29 DIAGNOSIS — G4733 Obstructive sleep apnea (adult) (pediatric): Secondary | ICD-10-CM | POA: Diagnosis not present

## 2022-10-05 ENCOUNTER — Encounter: Payer: PPO | Admitting: Internal Medicine

## 2022-10-17 ENCOUNTER — Encounter: Payer: Self-pay | Admitting: Medical

## 2022-10-17 ENCOUNTER — Ambulatory Visit (INDEPENDENT_AMBULATORY_CARE_PROVIDER_SITE_OTHER): Payer: PPO | Admitting: Medical

## 2022-10-17 VITALS — BP 158/88 | HR 62 | Temp 97.6°F | Resp 18 | Ht 66.0 in | Wt 236.0 lb

## 2022-10-17 DIAGNOSIS — R0981 Nasal congestion: Secondary | ICD-10-CM

## 2022-10-17 DIAGNOSIS — J45909 Unspecified asthma, uncomplicated: Secondary | ICD-10-CM

## 2022-10-17 DIAGNOSIS — J0100 Acute maxillary sinusitis, unspecified: Secondary | ICD-10-CM

## 2022-10-17 DIAGNOSIS — I10 Essential (primary) hypertension: Secondary | ICD-10-CM | POA: Diagnosis not present

## 2022-10-17 MED ORDER — FLUTICASONE PROPIONATE 50 MCG/ACT NA SUSP: 2.0000 | Freq: Every day | NASAL | 1 refills | Status: DC

## 2022-10-17 MED ORDER — AZITHROMYCIN 250 MG PO TABS: ORAL_TABLET | ORAL | 1 refills | Status: AC

## 2022-10-17 NOTE — Progress Notes (Signed)
Subjective:    Patient ID: Michael [name], male    DOB: August 11, 1950, 72 y.o.   MRN: [id number]  HPI  Discussed the use of AI scribe software for clinical note transcription with the patient, who gave verbal consent to proceed.  History of Present Illness   The patient, with a history of chronic recurrent sinus infections, asthma, and hypertension, presented with symptoms suggestive of a sinus infection. The symptoms began on a Saturday morning with a spontaneous nose bleed, predominantly on the left side, accompanied by sinus pain and congestion. The patient reported the discharge of green mucus mixed with blood following a saline rinse. The discomfort was localized to the left maxillary and frontal sinus regions, with associated left-sided otalgia and dental pain affecting the entire upper row of teeth. The patient also reported a sore throat and a mild cough, but denied any fever, chills, or sweats.  The patient has a history of sinus surgery in their 30s and a revision in 2016, which had been effective until the current episode. Despite the surgeries, the patient reported experiencing a couple of sinus infections per year. The patient also uses a CPAP machine, which they identified as a potential source of infections.  In addition to the sinus symptoms, the patient reported an increase in their blood pressure, which they attributed to pain. The patient's hypertension is currently managed with hydralazine 25 mg twice daily, a regimen that was initiated three weeks prior to the current consultation. The patient reported that their blood pressure felt less out of control since starting the hydralazine, but noted that it still occasionally spiked above 160.  The patient also reported having to use a nebulizer with DuoNeb on the Thursday and Friday prior to the consultation due to wheezing, a symptom likely related to their asthma. The patient did not specify the frequency of these episodes.         Review of Systems  Constitutional:  Negative for chills, fatigue and fever.  HENT:  Positive for congestion and sinus pressure. Negative for postnasal drip, sore throat, trouble swallowing and voice change.   Respiratory:  Positive for wheezing. Negative for cough, chest tightness and shortness of breath.        See hpi.  Gastrointestinal:  Negative for abdominal pain and anal bleeding.  Musculoskeletal:  Negative for back pain, joint swelling and neck pain.  Skin:  Negative for rash.  Neurological:  Negative for dizziness and light-headedness.  Hematological:  Negative for adenopathy. Does not bruise/bleed easily.  Psychiatric/Behavioral:  Negative for behavioral problems and decreased concentration.     Past Medical History:  Diagnosis Date   Allergy    Arthritis    Asthma    Blood transfusion without reported diagnosis    "as a child"   Cancer (HCC)    basal cell and squamous cell carcinoma   Cancer (HCC)    prostate   Cataract 2023   Clotting disorder (HCC)    Colon polyps    COPD (chronic obstructive pulmonary disease) (HCC) 1975   Difficult airway for intubation 09/28/2022   2015 at [facility]   GERD (gastroesophageal reflux disease)    Gout    High cholesterol    History of hepatitis    due to drug interaction?   History of pancreatitis 2016   ?due to colchicine   History of pulmonary embolus (PE) 2016   History of stomach ulcers    Hypertension    Hypothyroidism    Neuromuscular disorder (  HCC)    restless leg   OSA (obstructive sleep apnea) 2015   has CPAP   Osteopenia    Pneumonia    Seizures (HCC)    x1 10 years ago   Sleep apnea    Thyroid disease    hypothyroid   Ulcer 1985   Urinary incontinence      Social History   Socioeconomic History   Marital status: Married    Spouse name: Not on file   Number of children: 3   Years of education: Not on file   Highest education level: Bachelor's degree (e.g., BA, AB, BS)  Occupational History    Not on file  Tobacco Use   Smoking status: Never   Smokeless tobacco: Never  Vaping Use   Vaping Use: Never used  Substance and Sexual Activity   Alcohol use: Yes    Alcohol/week: 3.0 standard drinks of alcohol    Types: 3 Glasses of wine per week    Comment: occ wine   Drug use: No   Sexual activity: Yes    Birth control/protection: Surgical    Comment: Prostate removal  Other Topics Concern   Not on file  Social History Narrative   Former Engineer, agricultural at [employer], now Environmental health practitioner.    Married   3 daughters- all live locally   Has 7 grandchildren and 1 great grandchild   Enjoys reading, Environmental manager         Lives in a one story home with a bonus room over the garage.   Social Determinants of Health   Financial Resource Strain: Low Risk  (08/17/2022)   Overall Financial Resource Strain (CARDIA)    Difficulty of Paying Living Expenses: Not hard at all  Food Insecurity: No Food Insecurity (08/17/2022)   Hunger Vital Sign    Worried About Running Out of Food in the Last Year: Never true    Ran Out of Food in the Last Year: Never true  Transportation Needs: No Transportation Needs (08/17/2022)   PRAPARE - Administrator, Civil Service (Medical): No    Lack of Transportation (Non-Medical): No  Physical Activity: Insufficiently Active (08/17/2022)   Exercise Vital Sign    Days of Exercise per Week: 2 days    Minutes of Exercise per Session: 20 min  Stress: Stress Concern Present (08/17/2022)   [employer] of Occupational Health - Occupational Stress Questionnaire    Feeling of Stress : To some extent  Social Connections: Socially Integrated (08/17/2022)   Social Connection and Isolation Panel [NHANES]    Frequency of Communication with Friends and Family: More than three times a week    Frequency of Social Gatherings with Friends and Family: Twice a week    Attends Religious Services: More than 4 times per year    Active Member of [employer] or  Organizations: Yes    Attends Engineer, structural: More than 4 times per year    Marital Status: Married  Catering manager Violence: Not At Risk (08/25/2022)   Humiliation, Afraid, Rape, and Kick questionnaire    Fear of Current or Ex-Partner: No    Emotionally Abused: No    Physically Abused: No    Sexually Abused: No    Past Surgical History:  Procedure Laterality Date   BASAL CELL CARCINOMA EXCISION  2017   MOHS.  beneath right eye   BRAIN SURGERY  1967   Fractured skull in an accident   CHOLECYSTECTOMY  2001  COLON SURGERY  2001   Complication of gall bladder surgery   COLONOSCOPY     multiple - last 06/2017   COSMETIC SURGERY  2017   Basal cell cancer - MOHS surgery   HERNIA REPAIR  2001   HIP SURGERY     JOINT REPLACEMENT  06/2016   Total hip replacement - left hip   PENILE PROSTHESIS IMPLANT  2016   PROSTATECTOMY  2009   SINUS SURGERY WITH INSTATRAK  2014   SQUAMOUS CELL CARCINOMA EXCISION  01/2017   nose   TOOTH EXTRACTION     TOTAL HIP ARTHROPLASTY  06/2016    Family History  Problem Relation Age of Onset   Ulcerative colitis Mother    Colon polyps Mother    Asthma Mother    Arthritis Mother    Skin cancer Mother        basal cell carcinoma   Kidney failure Mother    Atrial fibrillation Mother    Kidney disease Mother    Obesity Mother    Asthma Father    Arthritis Father    Diabetes Father    Heart attack Father    Hyperlipidemia Father    Hypertension Father    Kidney disease Father    Obesity Father    Diabetes Brother    Arthritis Brother    Hyperlipidemia Brother    Brain cancer Brother        GBM   Cancer Brother    Alcohol abuse Brother    Breast cancer Maternal Grandmother    Cancer Maternal Grandmother    Diabetes Paternal Grandfather    Colon cancer Neg Hx    Esophageal cancer Neg Hx    Liver cancer Neg Hx    Pancreatic cancer Neg Hx    Rectal cancer Neg Hx    Stomach cancer Neg Hx    Crohn's disease Neg Hx      Allergies  Allergen Reactions   Baclofen Shortness Of Breath    Asthma exacerbation   Colchicine Other (See Comments)    Pancreatitis   Doxycycline Hives   Statins Other (See Comments)    Joint stiffness   Avelox [Moxifloxacin Hcl In Nacl] Other (See Comments)    SEIZURE   Bactrim [Sulfamethoxazole-Trimethoprim] Hives   Cefdinir Hives   Gabapentin     Eye twitch and difficult focusing his vision   Losartan     Rash    Oregano [Origanum Oil] Cough   Peanut-Containing Drug Products Swelling    Throat swelling   Adhesive  [Tape] Other (See Comments)    "tears skin"   Penicillins Rash    Current Outpatient Medications on File Prior to Visit  Medication Sig Dispense Refill   albuterol (VENTOLIN HFA) 108 (90 Base) MCG/ACT inhaler Inhale 2 puffs into the lungs every 6 (six) hours as needed for wheezing or shortness of breath. 18 g 1   allopurinol (ZYLOPRIM) 100 MG tablet TAKE 1 TABLET(100 MG) BY MOUTH TWICE DAILY 180 tablet 1   amLODipine (NORVASC) 10 MG tablet Take 1 tablet (10 mg total) by mouth daily. 90 tablet 1   calcium carbonate (OS-CAL - DOSED IN MG OF ELEMENTAL CALCIUM) 1250 (500 Ca) MG tablet Take 1 tablet by mouth daily.     Cholecalciferol (VITAMIN D3) 5000 units CAPS Take by mouth daily.     docusate sodium (COLACE) 100 MG capsule Take 200 mg by mouth daily.      EPINEPHrine 0.3 mg/0.3 mL IJ SOAJ injection Use  as directed for severe allergic reactions 2 each 2   fexofenadine (ALLEGRA) 180 MG tablet Take 180 mg by mouth daily.      fluticasone-salmeterol (WIXELA INHUB) 500-50 MCG/ACT AEPB Inhale 1 puff into the lungs 2 (two) times daily. in the morning and at bedtime. 60 each 5   hydrALAZINE (APRESOLINE) 25 MG tablet 1/2 tab twice daily for 1 week then increase to full tab twice daily. 60 tablet 3   ipratropium-albuterol (DUONEB) 0.5-2.5 (3) MG/3ML SOLN 1 vial in nebulizer every 4-6 hours while awake for the next 3-5 days for coughing or wheezing. 360 mL 1    levothyroxine (SYNTHROID) 75 MCG tablet Take 1 tablet (75 mcg total) by mouth daily before breakfast. 90 tablet 1   Magnesium 250 MG TABS Take by mouth daily.     montelukast (SINGULAIR) 10 MG tablet Take one tablet once at night for coughing or wheezing 90 tablet 3   tiZANidine (ZANAFLEX) 2 MG tablet TAKE 1 TO 2 TABLETS(2 TO 4 MG) BY MOUTH AT BEDTIME AS NEEDED FOR MUSCLE SPASMS 60 tablet 1   No current facility-administered medications on file prior to visit.    BP (!) 158/88   Pulse 62   Temp 97.6 F (36.4 C)   Resp 18   Ht 5\' 6"  (1.676 m)   Wt 236 lb (107 kg)   SpO2 96%   BMI 38.09 kg/m        Objective:   Physical Exam  General Mental Status- Alert. General Appearance- Not in acute distress.   Skin General: Color- Normal Color. Moisture- Normal Moisture.  Heent- left side frontal and maxillary sinus pain sinus pressure. More on maxillary sinus area. No rash on face seen  Neck Carotid Arteries- Normal color. Moisture- Normal Moisture. No carotid bruits. No JVD.  Chest and Lung Exam Auscultation: Breath Sounds:-Normal.  Cardiovascular Auscultation:Rythm- Regular. Murmurs & Other Heart Sounds:Auscultation of the heart reveals- No Murmurs.  Abdomen Inspection:-Inspeection Normal. Palpation/Percussion:Note:No mass. Palpation and Percussion of the abdomen reveal- Non Tender, Non Distended + BS, no rebound or guarding.   Neurologic Cranial Nerve exam:- CN III-XII intact(No nystagmus), symmetric smile. Strength:- 5/5 equal and symmetric strength both upper and lower extremities.       Assessment & Plan:   Assessment and Plan    Sinusitis: Left-sided sinus pain, congestion, and purulent nasal discharge since Saturday. History of recurrent sinus infections and sinus surgery. -Prescribe Azithromycin with one refill. -Continue Nasacort. -Consider Prednisone taper for 4 days to decrease sinus pressure.  Hypertension: Blood pressure elevated at 158/88, recently  switched to Hydralazine 25mg  BID. -Continue Hydralazine 25mg  BID. -Check blood pressure daily. -If BP >160/90, increase Hydralazine to TID. -Follow up with Melissa next Monday.  Asthma: Recent wheezing requiring use of nebulizer with DuoNeb. -Continue using nebulizer as needed. -Consider Prednisone taper for 4 days to help with lung function.   Follow up in one week or sooner if needed.       Esperanza Richters, PA-C

## 2022-10-17 NOTE — Patient Instructions (Addendum)
Sinusitis: Left-sided sinus pain, congestion, and purulent nasal discharge since Saturday. History of recurrent sinus infections and sinus surgery. -Prescribe Azithromycin with one refill. -Continue Nasacort. -Consider Prednisone taper for 4 days to decrease sinus pressure.  Hypertension: Blood pressure elevated at 158/88, recently switched to Hydralazine 25mg  BID. -Continue Hydralazine 25mg  BID. -Check blood pressure daily. -If BP >160/90, increase Hydralazine to TID. -Follow up with Melissa next Monday.(Pain may be making bp go up today)   Asthma: Recent wheezing requiring use of nebulizer with DuoNeb. -Continue using nebulizer as needed. -Consider Prednisone taper for 4 days to help with lung function.   Follow up in one week or sooner if needed.

## 2022-10-24 ENCOUNTER — Ambulatory Visit (INDEPENDENT_AMBULATORY_CARE_PROVIDER_SITE_OTHER): Payer: PPO | Admitting: Family

## 2022-10-24 VITALS — BP 142/82 | HR 65 | Temp 98.0°F | Resp 18 | Ht 66.0 in | Wt 240.0 lb

## 2022-10-24 DIAGNOSIS — I10 Essential (primary) hypertension: Secondary | ICD-10-CM | POA: Diagnosis not present

## 2022-10-24 DIAGNOSIS — J019 Acute sinusitis, unspecified: Secondary | ICD-10-CM

## 2022-10-24 DIAGNOSIS — J4550 Severe persistent asthma, uncomplicated: Secondary | ICD-10-CM

## 2022-10-24 MED ORDER — PREDNISONE 10 MG PO TABS: ORAL_TABLET | ORAL | 0 refills | Status: DC

## 2022-10-24 NOTE — Assessment & Plan Note (Signed)
Showing some improvement following 5 days of z-pak. This is day 6.  Completed a course of Z-Pak with some improvement but still symptomatic. Limited antibiotic options due to multiple allergies. -Start prednisone to help with sinusitis and asthma symptoms. Pt will let me know if his symptoms do not continue to improve in the next 4-5 days.  -Consider retesting for penicillin allergy in the future.

## 2022-10-24 NOTE — Progress Notes (Signed)
MyChart Video Visit    Virtual Visit via Video Note    Patient location: Home. Patient and provider in visit Provider location: Office  I discussed the limitations of evaluation and management by telemedicine and the availability of in person appointments. The patient expressed understanding and agreed to proceed.  Visit Date: 10/24/2022  Today's healthcare provider: Lemont Fillers, NP     Subjective:    Patient ID: Michael [name], male    DOB: Jun 05, 1950, 72 y.o.   MRN: [id number]  No chief complaint on file.   HPI  Past Medical History:  Diagnosis Date   Allergy    Arthritis    Asthma    Blood transfusion without reported diagnosis    "as a child"   Cancer (HCC)    basal cell and squamous cell carcinoma   Cancer (HCC)    prostate   Cataract 2023   Clotting disorder (HCC)    Colon polyps    COPD (chronic obstructive pulmonary disease) (HCC) 1975   Difficult airway for intubation 09/28/2022   2015 at [facility]   GERD (gastroesophageal reflux disease)    Gout    High cholesterol    History of hepatitis    due to drug interaction?   History of pancreatitis 2016   ?due to colchicine   History of pulmonary embolus (PE) 2016   History of stomach ulcers    Hypertension    Hypothyroidism    Neuromuscular disorder (HCC)    restless leg   OSA (obstructive sleep apnea) 2015   has CPAP   Osteopenia    Pneumonia    Seizures (HCC)    x1 10 years ago   Sleep apnea    Thyroid disease    hypothyroid   Ulcer 1985   Urinary incontinence     Past Surgical History:  Procedure Laterality Date   BASAL CELL CARCINOMA EXCISION  2017   MOHS.  beneath right eye   BRAIN SURGERY  1967   Fractured skull in an accident   CHOLECYSTECTOMY  2001   COLON SURGERY  2001   Complication of gall bladder surgery   COLONOSCOPY     multiple - last 06/2017   COSMETIC SURGERY  2017   Basal cell cancer - MOHS surgery   HERNIA REPAIR  2001   HIP SURGERY     JOINT  REPLACEMENT  06/2016   Total hip replacement - left hip   PENILE PROSTHESIS IMPLANT  2016   PROSTATECTOMY  2009   SINUS SURGERY WITH INSTATRAK  2014   SQUAMOUS CELL CARCINOMA EXCISION  01/2017   nose   TOOTH EXTRACTION     TOTAL HIP ARTHROPLASTY  06/2016    Family History  Problem Relation Age of Onset   Ulcerative colitis Mother    Colon polyps Mother    Asthma Mother    Arthritis Mother    Skin cancer Mother        basal cell carcinoma   Kidney failure Mother    Atrial fibrillation Mother    Kidney disease Mother    Obesity Mother    Asthma Father    Arthritis Father    Diabetes Father    Heart attack Father    Hyperlipidemia Father    Hypertension Father    Kidney disease Father    Obesity Father    Diabetes Brother    Arthritis Brother    Hyperlipidemia Brother    Brain cancer Brother  GBM   Cancer Brother    Alcohol abuse Brother    Breast cancer Maternal Grandmother    Cancer Maternal Grandmother    Diabetes Paternal Grandfather    Colon cancer Neg Hx    Esophageal cancer Neg Hx    Liver cancer Neg Hx    Pancreatic cancer Neg Hx    Rectal cancer Neg Hx    Stomach cancer Neg Hx    Crohn's disease Neg Hx     Social History   Socioeconomic History   Marital status: Married    Spouse name: Not on file   Number of children: 3   Years of education: Not on file   Highest education level: Bachelor's degree (e.g., BA, AB, BS)  Occupational History   Not on file  Tobacco Use   Smoking status: Never   Smokeless tobacco: Never  Vaping Use   Vaping Use: Never used  Substance and Sexual Activity   Alcohol use: Yes    Alcohol/week: 3.0 standard drinks of alcohol    Types: 3 Glasses of wine per week    Comment: occ wine   Drug use: No   Sexual activity: Yes    Birth control/protection: Surgical    Comment: Prostate removal  Other Topics Concern   Not on file  Social History Narrative   Former Engineer, agricultural at [employer], now Garment/textile technologist.    Married   3 daughters- all live locally   Has 7 grandchildren and 1 great grandchild   Enjoys reading, Environmental manager         Lives in a one story home with a bonus room over the garage.   Social Determinants of Health   Financial Resource Strain: Low Risk  (08/17/2022)   Overall Financial Resource Strain (CARDIA)    Difficulty of Paying Living Expenses: Not hard at all  Food Insecurity: No Food Insecurity (08/17/2022)   Hunger Vital Sign    Worried About Running Out of Food in the Last Year: Never true    Ran Out of Food in the Last Year: Never true  Transportation Needs: No Transportation Needs (08/17/2022)   PRAPARE - Administrator, Civil Service (Medical): No    Lack of Transportation (Non-Medical): No  Physical Activity: Insufficiently Active (08/17/2022)   Exercise Vital Sign    Days of Exercise per Week: 2 days    Minutes of Exercise per Session: 20 min  Stress: Stress Concern Present (08/17/2022)   [employer] of Occupational Health - Occupational Stress Questionnaire    Feeling of Stress : To some extent  Social Connections: Socially Integrated (08/17/2022)   Social Connection and Isolation Panel [NHANES]    Frequency of Communication with Friends and Family: More than three times a week    Frequency of Social Gatherings with Friends and Family: Twice a week    Attends Religious Services: More than 4 times per year    Active Member of [employer] or Organizations: Yes    Attends Engineer, structural: More than 4 times per year    Marital Status: Married  Catering manager Violence: Not At Risk (08/25/2022)   Humiliation, Afraid, Rape, and Kick questionnaire    Fear of Current or Ex-Partner: No    Emotionally Abused: No    Physically Abused: No    Sexually Abused: No    Outpatient Medications Prior to Visit  Medication Sig Dispense Refill   albuterol (VENTOLIN HFA) 108 (90 Base) MCG/ACT inhaler Inhale 2  puffs into the lungs every 6 (six)  hours as needed for wheezing or shortness of breath. 18 g 1   allopurinol (ZYLOPRIM) 100 MG tablet TAKE 1 TABLET(100 MG) BY MOUTH TWICE DAILY 180 tablet 1   amLODipine (NORVASC) 10 MG tablet Take 1 tablet (10 mg total) by mouth daily. 90 tablet 1   calcium carbonate (OS-CAL - DOSED IN MG OF ELEMENTAL CALCIUM) 1250 (500 Ca) MG tablet Take 1 tablet by mouth daily.     Cholecalciferol (VITAMIN D3) 5000 units CAPS Take by mouth daily.     docusate sodium (COLACE) 100 MG capsule Take 200 mg by mouth daily.      EPINEPHrine 0.3 mg/0.3 mL IJ SOAJ injection Use as directed for severe allergic reactions 2 each 2   fexofenadine (ALLEGRA) 180 MG tablet Take 180 mg by mouth daily.      fluticasone (FLONASE) 50 MCG/ACT nasal spray Place 2 sprays into both nostrils daily. 16 g 1   fluticasone-salmeterol (WIXELA INHUB) 500-50 MCG/ACT AEPB Inhale 1 puff into the lungs 2 (two) times daily. in the morning and at bedtime. 60 each 5   hydrALAZINE (APRESOLINE) 25 MG tablet 1/2 tab twice daily for 1 week then increase to full tab twice daily. 60 tablet 3   ipratropium-albuterol (DUONEB) 0.5-2.5 (3) MG/3ML SOLN 1 vial in nebulizer every 4-6 hours while awake for the next 3-5 days for coughing or wheezing. 360 mL 1   levothyroxine (SYNTHROID) 75 MCG tablet Take 1 tablet (75 mcg total) by mouth daily before breakfast. 90 tablet 1   Magnesium 250 MG TABS Take by mouth daily.     montelukast (SINGULAIR) 10 MG tablet Take one tablet once at night for coughing or wheezing 90 tablet 3   tiZANidine (ZANAFLEX) 2 MG tablet TAKE 1 TO 2 TABLETS(2 TO 4 MG) BY MOUTH AT BEDTIME AS NEEDED FOR MUSCLE SPASMS 60 tablet 1   No facility-administered medications prior to visit.    Allergies  Allergen Reactions   Baclofen Shortness Of Breath    Asthma exacerbation   Colchicine Other (See Comments)    Pancreatitis   Doxycycline Hives   Statins Other (See Comments)    Joint stiffness   Avelox [Moxifloxacin Hcl In Nacl] Other (See  Comments)    SEIZURE   Bactrim [Sulfamethoxazole-Trimethoprim] Hives   Cefdinir Hives   Gabapentin     Eye twitch and difficult focusing his vision   Losartan     Rash    Oregano [Origanum Oil] Cough   Peanut-Containing Drug Products Swelling    Throat swelling   Adhesive  [Tape] Other (See Comments)    "tears skin"   Penicillins Rash    ROS     Objective:    Physical Exam Constitutional:      General: He is not in acute distress.    Appearance: He is well-developed.  HENT:     Head: Normocephalic and atraumatic.  Cardiovascular:     Rate and Rhythm: Normal rate and regular rhythm.     Heart sounds: No murmur heard. Pulmonary:     Effort: Pulmonary effort is normal. No respiratory distress.     Breath sounds: No wheezing or rales.     Comments: Decreased air movement to the bases Skin:    General: Skin is warm and dry.  Neurological:     Mental Status: He is alert and oriented to person, place, and time.  Psychiatric:        Behavior: Behavior normal.  Thought Content: Thought content normal.     BP (!) 142/82   Pulse 65   Temp 98 F (36.7 C)   Resp 18   Ht 5\' 6"  (1.676 m)   Wt 240 lb (108.9 kg)   SpO2 97%   BMI 38.74 kg/m  Wt Readings from Last 3 Encounters:  10/24/22 240 lb (108.9 kg)  10/17/22 236 lb (107 kg)  09/26/22 234 lb (106.1 kg)       Assessment & Plan:   Problem List Items Addressed This Visit       Unprioritized   Severe persistent asthma    Poor control, impacting sleep. -Start prednisone to help with asthma symptoms. -Continue advair, albuterol.      Relevant Medications   predniSONE (DELTASONE) 10 MG tablet   Hypertension    Blood pressure readings at home around 140/80. Patient was not taking both prescribed antihypertensives (amlodipine and hydralazine) due to misunderstanding. -Resume amlodipine at 5mg  daily in addition to current hydralazine. -Check blood pressure at home and update in a few days.      Acute  sinusitis - Primary    Showing some improvement following 5 days of z-pak. This is day 6.  Completed a course of Z-Pak with some improvement but still symptomatic. Limited antibiotic options due to multiple allergies. -Start prednisone to help with sinusitis and asthma symptoms. Pt will let me know if his symptoms do not continue to improve in the next 4-5 days.  -Consider retesting for penicillin allergy in the future.      Relevant Medications   predniSONE (DELTASONE) 10 MG tablet    I am having Michael Hausen. Klett "Daphine Deutscher" start on predniSONE. I am also having him maintain his Vitamin D3, docusate sodium, fexofenadine, Magnesium, calcium carbonate, albuterol, ipratropium-albuterol, tiZANidine, fluticasone-salmeterol, montelukast, EPINEPHrine, amLODipine, allopurinol, levothyroxine, hydrALAZINE, and fluticasone.  Meds ordered this encounter  Medications   predniSONE (DELTASONE) 10 MG tablet    Sig: 4 tabs by mouth once daily for 2 days, then 3 tabs daily x 2 days, then 2 tabs daily x 2 days, then 1 tab daily x 2 days    Dispense:  20 tablet    Refill:  0    Order Specific Question:   Supervising Provider    Answer:   Danise Edge A [[id number]]    I discussed the assessment and treatment plan with the patient. The patient was provided an opportunity to ask questions and all were answered. The patient agreed with the plan and demonstrated an understanding of the instructions.   The patient was advised to call back or seek an in-person evaluation if the symptoms worsen or if the condition fails to improve as anticipated.   Lemont Fillers, NP [facility] at [facility] [phone] (phone) [phone] (fax)  [facility]

## 2022-10-24 NOTE — Assessment & Plan Note (Signed)
Blood pressure readings at home around 140/80. Patient was not taking both prescribed antihypertensives (amlodipine and hydralazine) due to misunderstanding. -Resume amlodipine at 5mg  daily in addition to current hydralazine. -Check blood pressure at home and update in a few days.

## 2022-10-24 NOTE — Patient Instructions (Signed)
VISIT SUMMARY:  During your visit, we discussed your concerns about high blood pressure, a persistent sinus infection, poorly controlled asthma, and your upcoming colonoscopy. You were not taking both of your prescribed blood pressure medications due to a misunderstanding, and your sinus infection persists despite completing a course of antibiotics. Your asthma has been affecting your sleep, and you have concerns about anesthesia for your upcoming colonoscopy due to a previous experience.  YOUR PLAN:  -HIGH BLOOD PRESSURE: High blood pressure, or hypertension, is when the force of blood against your artery walls is consistently too high. You should resume taking amlodipine at 5mg  daily in addition to your current hydralazine. Please monitor your blood pressure at home and update [place] in a few days.  -SINUS INFECTION: A sinus infection, or sinusitis, is an inflammation or swelling of the tissue lining the sinuses. We will start you on prednisone to help with your sinusitis symptoms. We may also consider retesting you for a penicillin allergy in the future to expand your treatment options.  -ASTHMA: Asthma is a condition in which your airways narrow and swell and produce extra mucus. This can make breathing difficult and trigger coughing, wheezing, and shortness of breath. We will start you on prednisone to help control your asthma symptoms.  INSTRUCTIONS:  Please resume taking amlodipine at 5mg  daily in addition to your current hydralazine. Monitor your blood pressure at home and update [place] in a few days. Start taking prednisone as prescribed to help with your sinusitis and asthma symptoms.  We will follow up with you in three months, assuming your blood pressure control is achieved at home.

## 2022-10-24 NOTE — Assessment & Plan Note (Signed)
Poor control, impacting sleep. -Start prednisone to help with asthma symptoms. -Continue advair, albuterol.

## 2022-11-01 ENCOUNTER — Other Ambulatory Visit: Payer: Self-pay

## 2022-11-01 MED ORDER — LEVOTHYROXINE SODIUM 75 MCG PO TABS: 75.0000 ug | ORAL_TABLET | Freq: Every day | ORAL | 1 refills | Status: DC

## 2022-11-08 ENCOUNTER — Other Ambulatory Visit: Payer: Self-pay | Admitting: Medical

## 2022-11-30 ENCOUNTER — Encounter: Payer: Self-pay | Admitting: Family

## 2022-11-30 ENCOUNTER — Ambulatory Visit (HOSPITAL_BASED_OUTPATIENT_CLINIC_OR_DEPARTMENT_OTHER)
Admission: RE | Admit: 2022-11-30 | Discharge: 2022-11-30 | Disposition: A | Discharge status: Home / Self Care | Payer: PPO | Source: Ambulatory Visit | Attending: Family | Admitting: Family

## 2022-11-30 ENCOUNTER — Telehealth: Payer: Self-pay | Admitting: Internal Medicine

## 2022-11-30 DIAGNOSIS — R600 Localized edema: Secondary | ICD-10-CM | POA: Insufficient documentation

## 2022-11-30 DIAGNOSIS — U071 COVID-19: Secondary | ICD-10-CM

## 2022-11-30 MED ORDER — PREDNISONE 10 MG PO TABS: ORAL_TABLET | ORAL | 0 refills | Status: DC

## 2022-11-30 MED ORDER — PAXLOVID (300/100) 20 X 150 MG & 10 X 100MG PO TBPK
ORAL_TABLET | ORAL | 0 refills | Status: DC
Start: 2022-11-30 — End: 2022-12-23

## 2022-11-30 NOTE — Telephone Encounter (Signed)
Inbound call from patient returning phone call. States it is okay to rescheduled for September and he will see appointment in [place]. Please advise, thank you.

## 2022-11-30 NOTE — Telephone Encounter (Signed)
Pt stated that he was diagnosed with Covid today by his PCP.  Pt stated that his PCP prescribed Paxlovid today also. Pt has Colonoscopy scheduled  on 12/08/2022 at [facility] hospital.  Please advise

## 2022-11-30 NOTE — Telephone Encounter (Signed)
Spoke to patient recommended that he begin paxlovid.  He reports significant bilateral LE edema, just returned from [place]. Advised pt that we would schedule LE dopplers today.  Scheduled his Doppler for 3 PM today [facility]. Advised pt to stop wixela while on paxlovid and not resume until 3 days after completion of Paxlovid.   Will also send rx for prednisone taper for him to have on hand if needed since we are holding his inhaled steroid. Pt verbalizes understanding.

## 2022-11-30 NOTE — Telephone Encounter (Signed)
Pt was made aware of Dr. Leone Payor recommendations: Pt is available for 01/17/2023  Pt case ID number from recent canceled Colonoscopy is [id number] Scheduling called and requested to add on case starting at 11:15 AM. Was notified that I need to call [facility] due to not enough time . [facility] called, spoke with Raford Pitcher recommended to  call back in the AM and speak with Clydie Braun the charge nurse. [phone]

## 2022-11-30 NOTE — Telephone Encounter (Signed)
PT is scheduled for a colonoscopy 8/15 at [facility] and just tested positive for covid. Wants to know if he needs to reschedule. Please advise.

## 2022-11-30 NOTE — Telephone Encounter (Signed)
Procedure was canceled. Left message for pt to call back

## 2022-11-30 NOTE — Telephone Encounter (Signed)
Thank him for letting us know  I want to cancel this appointment and set him up for one of the other available spots in the future

## 2022-12-01 ENCOUNTER — Encounter: Payer: Self-pay | Admitting: Family

## 2022-12-02 ENCOUNTER — Other Ambulatory Visit: Payer: Self-pay

## 2022-12-02 NOTE — Telephone Encounter (Signed)
Confirmed with patient that he did receive copy of new instructions.

## 2022-12-02 NOTE — Telephone Encounter (Signed)
Spoke with Clydie Braun, RN at [facility] endo & confirmed that it is okay to schedule hospital procedure on 01/17/23 at 11:15 am. Updated instructions have been sent to patient through mychart & advised that he call back with any questions.

## 2022-12-08 ENCOUNTER — Encounter (HOSPITAL_COMMUNITY): Payer: Self-pay

## 2022-12-08 ENCOUNTER — Ambulatory Visit (HOSPITAL_COMMUNITY): Admit: 2022-12-08 | Payer: PPO | Admitting: Internal Medicine

## 2022-12-08 SURGERY — COLONOSCOPY WITH PROPOFOL
Anesthesia: Monitor Anesthesia Care

## 2022-12-22 ENCOUNTER — Other Ambulatory Visit: Payer: Self-pay | Admitting: Family

## 2022-12-23 ENCOUNTER — Ambulatory Visit (INDEPENDENT_AMBULATORY_CARE_PROVIDER_SITE_OTHER): Payer: PPO | Admitting: Family

## 2022-12-23 VITALS — BP 134/66 | HR 64 | Resp 18 | Ht 66.0 in | Wt 235.0 lb

## 2022-12-23 DIAGNOSIS — J0101 Acute recurrent maxillary sinusitis: Secondary | ICD-10-CM

## 2022-12-23 DIAGNOSIS — G8929 Other chronic pain: Secondary | ICD-10-CM | POA: Diagnosis not present

## 2022-12-23 DIAGNOSIS — J4551 Severe persistent asthma with (acute) exacerbation: Secondary | ICD-10-CM | POA: Diagnosis not present

## 2022-12-23 DIAGNOSIS — M898X1 Other specified disorders of bone, shoulder: Secondary | ICD-10-CM

## 2022-12-23 DIAGNOSIS — I10 Essential (primary) hypertension: Secondary | ICD-10-CM | POA: Diagnosis not present

## 2022-12-23 MED ORDER — AZITHROMYCIN 250 MG PO TABS
ORAL_TABLET | ORAL | 0 refills | Status: AC
Start: 2022-12-23 — End: 2022-12-28

## 2022-12-23 MED ORDER — PREDNISONE 10 MG PO TABS
ORAL_TABLET | ORAL | 0 refills | Status: DC
Start: 2022-12-23 — End: 2023-01-17

## 2022-12-23 NOTE — Assessment & Plan Note (Signed)
Ok to continue tylenol and lidocaine patches. Prednisone should also help the pain. Will refer to sports medicine.

## 2022-12-23 NOTE — Assessment & Plan Note (Signed)
Worsened by recent covid infection and temporary need to hold salemeterol during paxlovid treatment.  Will give a long prednisone taper. Continue wixela, singulair, albuterol. Pt is advised to call if symptoms fail to improve.

## 2022-12-23 NOTE — Patient Instructions (Signed)
VISIT SUMMARY:  During your visit, we discussed your concerns about recurrent sinus infections, worsening asthma, left shoulder pain, and leg swelling during your recent trip. We also reviewed your well-controlled hypertension and general health maintenance.  YOUR PLAN:  -SINUSITIS: You have recurrent sinus infections, and your current symptoms suggest another one. Sinusitis is an inflammation or swelling of the tissue lining the sinuses, often caused by an infection. We will start you on a Z-Pak antibiotic to treat this. We may also consider referring you to an Ear, Nose, and Throat (ENT) specialist to check for any structural abnormalities that could be causing these recurrent infections.  -ASTHMA: Your asthma symptoms have worsened after your recent COVID-19 infection, and you had a severe attack a week ago. Asthma is a condition in which your airways narrow and swell and produce extra mucus. This can make breathing difficult and trigger coughing, wheezing, and shortness of breath. We will start you on a longer taper of prednisone to help control your symptoms. You should continue your current management with periodic prednisone use.  -SHOULDER PAIN: You have been experiencing chronic left shoulder pain that has worsened over the past 2-3 weeks and is disrupting your sleep. We will refer you to sports medicine for further evaluation and treatment. In the meantime, continue using lidocaine patches and Tylenol for pain management.  -EDEMA: You experienced leg swelling during your recent trip to [place]. Edema is swelling caused by excess fluid trapped in your body's tissues. We will provide Lasix, a medication that helps your body get rid of excess water and salt, for future trips to manage swelling as needed.  -HYPERTENSION: Your high blood pressure is well controlled on your current regimen of hydralazine. Hypertension, or high blood pressure, is a condition where the force of the blood against the  artery walls is too high. Continue your current regimen.  -GENERAL HEALTH MAINTENANCE: It's important to stay up-to-date with routine health maintenance. Please get your flu shot at [facility] today.  INSTRUCTIONS:  Please start the Z-Pak antibiotic as prescribed for your sinus infection. Begin the longer taper of prednisone for your asthma. Continue using lidocaine patches and Tylenol for your shoulder pain. Use Lasix as needed for leg swelling during trips. Continue your current regimen of hydralazine for your hypertension. Lastly, please get your flu shot at [facility].

## 2022-12-23 NOTE — Assessment & Plan Note (Signed)
BP Readings from Last 3 Encounters:  12/23/22 134/66  10/24/22 (!) 142/82  10/17/22 (!) 158/88   BP is improved. Continue amlodipine/hydralazine.

## 2022-12-23 NOTE — Assessment & Plan Note (Signed)
Rx with prednisone and azithromycin due to multiple drug allergies.

## 2022-12-23 NOTE — Progress Notes (Signed)
Subjective:     Patient ID: Michael [name], male    DOB: 01-07-1951, 72 y.o.   MRN: [id number]  Chief Complaint  Patient presents with   Shoulder Pain    Left onset: 1 month throbbing pain    Shoulder Pain     Discussed the use of AI scribe software for clinical note transcription with the patient, who gave verbal consent to proceed.  History of Present Illness   The patient, with a history of severe asthma and recurrent sinus infections, presents with concerns of a recurrent left sided sinus infection, worsening asthma symptoms, and left shoulder pain. He reports symptoms of left sinus pressure, left eye discharge tearing, and pain of the left upper teeth. He also notes that his asthma has not fully recovered following a recent COVID-19 infection, and he experienced a severe asthma attack approximately a week ago. He has previously tried various asthma medications, including Fasenra and Dupixent, but had to discontinue due to high copay costs. He currently manages his asthma with wixela, albuterol, singulair and prednisone two to three times a year. He is followed by pulmonology- Dr. Everardo All for his asthma as well.   The patient also reports left shoulder pain that has been present for approximately six to seven months and has worsened in the past two to three weeks. The pain is located around the edge of the left shoulder blade and is severe enough to disrupt his sleep. He has been using a lidocaine patch for pain management.  Additionally, the patient mentions a recent trip to [place] during which he experienced significant bilateral leg swelling. He believes the swelling was due to a combination of heat, blood pressure medication, and extensive walking. He reports that the swelling was severe enough to cause a rash around his ankles and reduce his urine output.          Health Maintenance Due  Topic Date Due   COVID-19 Vaccine (7 - 2023-24 season) 03/21/2022   INFLUENZA  VACCINE  11/24/2022    Past Medical History:  Diagnosis Date   Allergy    Arthritis    Asthma    Blood transfusion without reported diagnosis    "as a child"   Cancer (HCC)    basal cell and squamous cell carcinoma   Cancer (HCC)    prostate   Cataract 2023   Clotting disorder (HCC)    Colon polyps    COPD (chronic obstructive pulmonary disease) (HCC) 1975   Difficult airway for intubation 09/28/2022   2015 at [facility]   GERD (gastroesophageal reflux disease)    Gout    High cholesterol    History of hepatitis    due to drug interaction?   History of pancreatitis 2016   ?due to colchicine   History of pulmonary embolus (PE) 2016   History of stomach ulcers    Hypertension    Hypothyroidism    Neuromuscular disorder (HCC)    restless leg   OSA (obstructive sleep apnea) 2015   has CPAP   Osteopenia    Pneumonia    Seizures (HCC)    x1 10 years ago   Sleep apnea    Thyroid disease    hypothyroid   Ulcer 1985   Urinary incontinence     Past Surgical History:  Procedure Laterality Date   BASAL CELL CARCINOMA EXCISION  2017   MOHS.  beneath right eye   BRAIN SURGERY  1967   Fractured skull in an accident  CHOLECYSTECTOMY  2001   COLON SURGERY  2001   Complication of gall bladder surgery   COLONOSCOPY     multiple - last 06/2017   COSMETIC SURGERY  2017   Basal cell cancer - MOHS surgery   HERNIA REPAIR  2001   HIP SURGERY     JOINT REPLACEMENT  06/2016   Total hip replacement - left hip   PENILE PROSTHESIS IMPLANT  2016   PROSTATECTOMY  2009   SINUS SURGERY WITH INSTATRAK  2014   SQUAMOUS CELL CARCINOMA EXCISION  01/2017   nose   TOOTH EXTRACTION     TOTAL HIP ARTHROPLASTY  06/2016    Family History  Problem Relation Age of Onset   Ulcerative colitis Mother    Colon polyps Mother    Asthma Mother    Arthritis Mother    Skin cancer Mother        basal cell carcinoma   Kidney failure Mother    Atrial fibrillation Mother    Kidney disease Mother     Obesity Mother    Asthma Father    Arthritis Father    Diabetes Father    Heart attack Father    Hyperlipidemia Father    Hypertension Father    Kidney disease Father    Obesity Father    Diabetes Brother    Arthritis Brother    Hyperlipidemia Brother    Brain cancer Brother        GBM   Cancer Brother    Alcohol abuse Brother    Breast cancer Maternal Grandmother    Cancer Maternal Grandmother    Diabetes Paternal Grandfather    Colon cancer Neg Hx    Esophageal cancer Neg Hx    Liver cancer Neg Hx    Pancreatic cancer Neg Hx    Rectal cancer Neg Hx    Stomach cancer Neg Hx    Crohn's disease Neg Hx     Social History   Socioeconomic History   Marital status: Married    Spouse name: Not on file   Number of children: 3   Years of education: Not on file   Highest education level: Bachelor's degree (e.g., BA, AB, BS)  Occupational History   Not on file  Tobacco Use   Smoking status: Never   Smokeless tobacco: Never  Vaping Use   Vaping status: Never Used  Substance and Sexual Activity   Alcohol use: Yes    Alcohol/week: 3.0 standard drinks of alcohol    Types: 3 Glasses of wine per week    Comment: occ wine   Drug use: No   Sexual activity: Yes    Birth control/protection: Surgical    Comment: Prostate removal  Other Topics Concern   Not on file  Social History Narrative   Former Engineer, agricultural at [employer], now Environmental health practitioner.    Married   3 daughters- all live locally   Has 7 grandchildren and 1 great grandchild   Enjoys reading, Environmental manager         Lives in a one story home with a bonus room over the garage.   Social Determinants of Health   Financial Resource Strain: Low Risk  (08/17/2022)   Overall Financial Resource Strain (CARDIA)    Difficulty of Paying Living Expenses: Not hard at all  Food Insecurity: No Food Insecurity (08/17/2022)   Hunger Vital Sign    Worried About Running Out of Food in the Last Year: Never true  Ran Out of Food in the Last Year: Never true  Transportation Needs: No Transportation Needs (08/17/2022)   PRAPARE - Administrator, Civil Service (Medical): No    Lack of Transportation (Non-Medical): No  Physical Activity: Insufficiently Active (08/17/2022)   Exercise Vital Sign    Days of Exercise per Week: 2 days    Minutes of Exercise per Session: 20 min  Stress: Stress Concern Present (08/17/2022)   [employer] of Occupational Health - Occupational Stress Questionnaire    Feeling of Stress : To some extent  Social Connections: Socially Integrated (08/17/2022)   Social Connection and Isolation Panel [NHANES]    Frequency of Communication with Friends and Family: More than three times a week    Frequency of Social Gatherings with Friends and Family: Twice a week    Attends Religious Services: More than 4 times per year    Active Member of [employer] or Organizations: Yes    Attends Engineer, structural: More than 4 times per year    Marital Status: Married  Catering manager Violence: Not At Risk (08/25/2022)   Humiliation, Afraid, Rape, and Kick questionnaire    Fear of Current or Ex-Partner: No    Emotionally Abused: No    Physically Abused: No    Sexually Abused: No    Outpatient Medications Prior to Visit  Medication Sig Dispense Refill   albuterol (VENTOLIN HFA) 108 (90 Base) MCG/ACT inhaler Inhale 2 puffs into the lungs every 6 (six) hours as needed for wheezing or shortness of breath. 18 g 1   allopurinol (ZYLOPRIM) 100 MG tablet TAKE 1 TABLET(100 MG) BY MOUTH TWICE DAILY 180 tablet 1   amLODipine (NORVASC) 10 MG tablet Take 1 tablet (10 mg total) by mouth daily. 90 tablet 1   calcium carbonate (OS-CAL - DOSED IN MG OF ELEMENTAL CALCIUM) 1250 (500 Ca) MG tablet Take 1 tablet by mouth daily.     Cholecalciferol (VITAMIN D3) 5000 units CAPS Take by mouth daily.     docusate sodium (COLACE) 100 MG capsule Take 200 mg by mouth daily.      EPINEPHrine 0.3  mg/0.3 mL IJ SOAJ injection Use as directed for severe allergic reactions 2 each 2   fexofenadine (ALLEGRA) 180 MG tablet Take 180 mg by mouth daily.      fluticasone (FLONASE) 50 MCG/ACT nasal spray SPRAY 2 SPRAYS INTO EACH NOSTRIL EVERY DAY 48 mL 1   fluticasone-salmeterol (WIXELA INHUB) 500-50 MCG/ACT AEPB Inhale 1 puff into the lungs 2 (two) times daily. in the morning and at bedtime. 60 each 5   hydrALAZINE (APRESOLINE) 25 MG tablet Take 1 tablet (25 mg total) by mouth in the morning and at bedtime. 180 tablet 1   ipratropium-albuterol (DUONEB) 0.5-2.5 (3) MG/3ML SOLN 1 vial in nebulizer every 4-6 hours while awake for the next 3-5 days for coughing or wheezing. 360 mL 1   levothyroxine (SYNTHROID) 75 MCG tablet Take 1 tablet (75 mcg total) by mouth daily before breakfast. 90 tablet 1   Magnesium 250 MG TABS Take by mouth daily.     montelukast (SINGULAIR) 10 MG tablet Take one tablet once at night for coughing or wheezing 90 tablet 3   tiZANidine (ZANAFLEX) 2 MG tablet TAKE 1 TO 2 TABLETS(2 TO 4 MG) BY MOUTH AT BEDTIME AS NEEDED FOR MUSCLE SPASMS 60 tablet 1   nirmatrelvir & ritonavir (PAXLOVID, 300/100,) 20 x 150 MG & 10 x 100MG  TBPK Take per package instructions. 30 tablet 0  predniSONE (DELTASONE) 10 MG tablet 4 tabs by mouth once daily for 2 days, then 3 tabs daily x 2 days, then 2 tabs daily x 2 days, then 1 tab daily x 2 days 20 tablet 0   No facility-administered medications prior to visit.    Allergies  Allergen Reactions   Baclofen Shortness Of Breath    Asthma exacerbation   Colchicine Other (See Comments)    Pancreatitis   Doxycycline Hives   Statins Other (See Comments)    Joint stiffness   Avelox [Moxifloxacin Hcl In Nacl] Other (See Comments)    SEIZURE   Bactrim [Sulfamethoxazole-Trimethoprim] Hives   Cefdinir Hives   Gabapentin     Eye twitch and difficult focusing his vision   Losartan     Rash    Oregano [Origanum Oil] Cough   Peanut-Containing Drug  Products Swelling    Throat swelling   Adhesive  [Tape] Other (See Comments)    "tears skin"   Penicillins Rash    ROS     Objective:    Physical Exam Constitutional:      General: He is not in acute distress.    Appearance: He is well-developed.  HENT:     Head: Normocephalic and atraumatic.     Right Ear: Tympanic membrane and ear canal normal.     Left Ear: Tympanic membrane and ear canal normal.     Nose:     Right Sinus: No maxillary sinus tenderness or frontal sinus tenderness.     Left Sinus: Maxillary sinus tenderness present. No frontal sinus tenderness.  Cardiovascular:     Rate and Rhythm: Normal rate and regular rhythm.     Heart sounds: No murmur heard. Pulmonary:     Effort: Pulmonary effort is normal. No respiratory distress.     Breath sounds: Examination of the right-middle field reveals decreased breath sounds. Examination of the left-middle field reveals decreased breath sounds. Examination of the right-lower field reveals decreased breath sounds. Examination of the left-lower field reveals decreased breath sounds. Decreased breath sounds present. No wheezing, rhonchi or rales.  Musculoskeletal:       Arms:     Comments: Point tenderness adjacent to left scapula  Skin:    General: Skin is warm and dry.  Neurological:     Mental Status: He is alert and oriented to person, place, and time.  Psychiatric:        Behavior: Behavior normal.        Thought Content: Thought content normal.      BP 134/66   Pulse 64   Resp 18   Ht 5\' 6"  (1.676 m)   Wt 235 lb (106.6 kg)   SpO2 95%   BMI 37.93 kg/m  Wt Readings from Last 3 Encounters:  12/23/22 235 lb (106.6 kg)  10/24/22 240 lb (108.9 kg)  10/17/22 236 lb (107 kg)       Assessment & Plan:   Problem List Items Addressed This Visit       Unprioritized   Severe persistent asthma    Worsened by recent covid infection and temporary need to hold salemeterol during paxlovid treatment.  Will give a  long prednisone taper. Continue wixela, singulair, albuterol. Pt is advised to call if symptoms fail to improve.       Relevant Medications   predniSONE (DELTASONE) 10 MG tablet   Chronic scapular pain - Primary    Ok to continue tylenol and lidocaine patches. Prednisone should also help the pain.  Will refer to sports medicine.       Relevant Medications   predniSONE (DELTASONE) 10 MG tablet   Other Relevant Orders   Ambulatory referral to Sports Medicine   Acute sinusitis    Rx with prednisone and azithromycin due to multiple drug allergies.       Relevant Medications   azithromycin (ZITHROMAX) 250 MG tablet   predniSONE (DELTASONE) 10 MG tablet    I have discontinued Lesia Hausen. Michael [name] Paxlovid (300/100). I have also changed his predniSONE. Additionally, I am having him start on azithromycin. Lastly, I am having him maintain his Vitamin D3, docusate sodium, fexofenadine, Magnesium, calcium carbonate, albuterol, ipratropium-albuterol, tiZANidine, fluticasone-salmeterol, montelukast, EPINEPHrine, amLODipine, allopurinol, levothyroxine, fluticasone, and hydrALAZINE.  Meds ordered this encounter  Medications   azithromycin (ZITHROMAX) 250 MG tablet    Sig: Take 2 tablets on day 1, then 1 tablet daily on days 2 through 5    Dispense:  6 tablet    Refill:  0    Order Specific Question:   Supervising Provider    Answer:   Danise Edge A [[id number]]   predniSONE (DELTASONE) 10 MG tablet    Sig: 4 tabs by mouth once daily for 3 days, then 3 tabs daily x 3 days, then 2 tabs daily x 3 days, then 1 tab daily x 3 days    Dispense:  30 tablet    Refill:  0    Order Specific Question:   Supervising Provider    Answer:   Danise Edge A [[id number]]

## 2023-01-04 ENCOUNTER — Encounter: Payer: Self-pay | Admitting: Family

## 2023-01-10 NOTE — Progress Notes (Addendum)
Anesthesia Review:  PCP: Sandford Craze  Cardiologist : none Chest x-ray : EKG : Echo : Stress test: Cardiac Cath :  Activity level:  Sleep Study/ CPAP :has cpap  Fasting Blood Sugar :      / Checks Blood Sugar -- times a day:   Blood Thinner/ Instructions /Last Dose: ASA / Instructions/ Last Dose :   DM- none    Hx of penile implant

## 2023-01-11 ENCOUNTER — Ambulatory Visit: Payer: PPO | Admitting: Sports Medicine

## 2023-01-11 ENCOUNTER — Encounter: Payer: Self-pay | Admitting: Sports Medicine

## 2023-01-11 VITALS — BP 138/80 | Ht 66.0 in | Wt 235.0 lb

## 2023-01-11 DIAGNOSIS — M5030 Other cervical disc degeneration, unspecified cervical region: Secondary | ICD-10-CM

## 2023-01-11 DIAGNOSIS — M47812 Spondylosis without myelopathy or radiculopathy, cervical region: Secondary | ICD-10-CM | POA: Diagnosis not present

## 2023-01-11 NOTE — Progress Notes (Signed)
Subjective:    Patient ID: Michael [name], male    DOB: [birth year], 72 y.o.   MRN: [id number]  HPI chief complaint: Left scapular pain  Patient is a very pleasant 72 year old male that presents today with chronic left shoulder pain that he localizes to the left scapula.  It is grown progressively worse over the past several months.  It is now to the point where it is pretty constant.  It is most noticeable when he flexes his neck.  He has had problems with his neck in the past.  An x-ray done by Dr. Prince Rome in 2021 showed moderate degenerative disc disease at C5-C6 and moderate to severe at C6-C7 with spurring and facet arthropathy at these levels.  He cannot take NSAIDs.  He recently finished a steroid taper for an unrelated condition but found that it did not help his pain.  He uses a lidocaine patch which does seem to help but he is using them daily.  Pain will occasionally radiate into the arms.  No prior neck surgery.  Past medical history reviewed Medications reviewed Allergies reviewed    Review of Systems As above    Objective:   Physical Exam  Developed, well-nourished.  No acute distress  Cervical spine: Good cervical range of motion.  No tenderness to palpation.  He does have reproducible scapular pain with cervical spine flexion.  Left shoulder: Popeye deformity from previous biceps tendon rupture.  Otherwise, full painless shoulder range of motion.  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  Follow-up with me again in 4 weeks for reevaluation.  If symptoms are not improving, consider MRI in anticipation of referral for facet injections.  Call with questions or concerns in the interim.  This note was dictated using Dragon naturally speaking software and may contain errors in syntax, spelling, or  content which have not been identified prior to signing this note.

## 2023-01-16 ENCOUNTER — Telehealth: Payer: Self-pay | Admitting: Family

## 2023-01-16 DIAGNOSIS — R911 Solitary pulmonary nodule: Secondary | ICD-10-CM

## 2023-01-16 NOTE — Telephone Encounter (Signed)
See mychart.  

## 2023-01-16 NOTE — Anesthesia Preprocedure Evaluation (Signed)
Anesthesia Evaluation  Patient identified by MRN, date of birth, ID band Patient awake    Reviewed: Allergy & Precautions, NPO status , Patient's Chart, lab work & pertinent test results  History of Anesthesia Complications (+) DIFFICULT AIRWAY and history of anesthetic complications  Airway Mallampati: IV  TM Distance: >3 FB Neck ROM: Full    Dental no notable dental hx. (+) Teeth Intact, Dental Advisory Given   Pulmonary asthma , sleep apnea  Recent covid infection   Pulmonary exam normal breath sounds clear to auscultation       Cardiovascular hypertension, Normal cardiovascular exam Rhythm:Regular Rate:Normal     Neuro/Psych Seizures -,   negative psych ROS   GI/Hepatic   Endo/Other  Hypothyroidism    Renal/GU      Musculoskeletal  (+) Arthritis ,    Abdominal  (+) + obese  Peds  Hematology   Anesthesia Other Findings ALL: see list  Reproductive/Obstetrics                             Anesthesia Physical Anesthesia Plan  ASA: 2  Anesthesia Plan: MAC   Post-op Pain Management:    Induction:   PONV Risk Score and Plan: Treatment may vary due to age or medical condition and Propofol infusion  Airway Management Planned: Nasal Cannula and Natural Airway  Additional Equipment: None  Intra-op Plan:   Post-operative Plan:   Informed Consent: I have reviewed the patients History and Physical, chart, labs and discussed the procedure including the risks, benefits and alternatives for the proposed anesthesia with the patient or authorized representative who has indicated his/her understanding and acceptance.     Dental advisory given  Plan Discussed with: CRNA  Anesthesia Plan Comments: (Colon Ca Screening)        Anesthesia Quick Evaluation

## 2023-01-17 ENCOUNTER — Ambulatory Visit (HOSPITAL_BASED_OUTPATIENT_CLINIC_OR_DEPARTMENT_OTHER): Payer: PPO | Admitting: Anesthesiology

## 2023-01-17 ENCOUNTER — Ambulatory Visit (HOSPITAL_COMMUNITY): Payer: PPO | Admitting: Anesthesiology

## 2023-01-17 ENCOUNTER — Encounter (HOSPITAL_COMMUNITY)
Admission: RE | Disposition: A | Discharge status: Home / Self Care | Payer: Self-pay | Source: Ambulatory Visit | Attending: Internal Medicine

## 2023-01-17 ENCOUNTER — Encounter (HOSPITAL_COMMUNITY): Payer: Self-pay | Admitting: Internal Medicine

## 2023-01-17 ENCOUNTER — Ambulatory Visit (HOSPITAL_COMMUNITY)
Admission: RE | Admit: 2023-01-17 | Discharge: 2023-01-17 | Disposition: A | Discharge status: Home / Self Care | Payer: PPO | Source: Ambulatory Visit | Attending: Internal Medicine | Admitting: Internal Medicine

## 2023-01-17 ENCOUNTER — Other Ambulatory Visit: Payer: Self-pay

## 2023-01-17 DIAGNOSIS — G4733 Obstructive sleep apnea (adult) (pediatric): Secondary | ICD-10-CM | POA: Insufficient documentation

## 2023-01-17 DIAGNOSIS — Z1211 Encounter for screening for malignant neoplasm of colon: Secondary | ICD-10-CM | POA: Diagnosis not present

## 2023-01-17 DIAGNOSIS — I10 Essential (primary) hypertension: Secondary | ICD-10-CM | POA: Diagnosis not present

## 2023-01-17 DIAGNOSIS — E039 Hypothyroidism, unspecified: Secondary | ICD-10-CM | POA: Diagnosis not present

## 2023-01-17 DIAGNOSIS — K5730 Diverticulosis of large intestine without perforation or abscess without bleeding: Secondary | ICD-10-CM

## 2023-01-17 DIAGNOSIS — M1990 Unspecified osteoarthritis, unspecified site: Secondary | ICD-10-CM | POA: Insufficient documentation

## 2023-01-17 DIAGNOSIS — K635 Polyp of colon: Secondary | ICD-10-CM

## 2023-01-17 DIAGNOSIS — D123 Benign neoplasm of transverse colon: Secondary | ICD-10-CM | POA: Diagnosis not present

## 2023-01-17 DIAGNOSIS — Z8616 Personal history of COVID-19: Secondary | ICD-10-CM | POA: Diagnosis not present

## 2023-01-17 DIAGNOSIS — Z860100 Personal history of colon polyps, unspecified: Secondary | ICD-10-CM

## 2023-01-17 DIAGNOSIS — J45909 Unspecified asthma, uncomplicated: Secondary | ICD-10-CM | POA: Diagnosis not present

## 2023-01-17 HISTORY — PX: COLONOSCOPY WITH PROPOFOL: SHX5780

## 2023-01-17 HISTORY — PX: POLYPECTOMY: SHX5525

## 2023-01-17 SURGERY — COLONOSCOPY WITH PROPOFOL
Anesthesia: Monitor Anesthesia Care

## 2023-01-17 MED ORDER — SODIUM CHLORIDE 0.9 % IV SOLN: INTRAVENOUS | Status: DC

## 2023-01-17 MED ORDER — LACTATED RINGERS IV SOLN: INTRAVENOUS | Status: DC

## 2023-01-17 MED ORDER — PROPOFOL 500 MG/50ML IV EMUL
INTRAVENOUS | Status: DC | PRN
  Administered 2023-01-17: 20 mg via INTRAVENOUS
  Administered 2023-01-17: 125 ug/kg/min via INTRAVENOUS
  Administered 2023-01-17: 30 mg via INTRAVENOUS

## 2023-01-17 SURGICAL SUPPLY — 22 items

## 2023-01-17 NOTE — Anesthesia Postprocedure Evaluation (Signed)
Anesthesia Post Note  Patient: Michael [name]  Procedure(s) Performed: COLONOSCOPY WITH PROPOFOL POLYPECTOMY     Patient location during evaluation: Endoscopy Anesthesia Type: MAC Level of consciousness: awake and alert Pain management: pain level controlled Vital Signs Assessment: post-procedure vital signs reviewed and stable Respiratory status: spontaneous breathing, nonlabored ventilation, respiratory function stable and patient connected to nasal cannula oxygen Cardiovascular status: blood pressure returned to baseline and stable Postop Assessment: no apparent nausea or vomiting Anesthetic complications: no   No notable events documented.  Last Vitals:  Vitals:   01/17/23 1116 01/17/23 1130  BP: 121/72 132/87  Pulse: 62 (!) 55  Resp: 19 17  Temp: (!) 36.4 C   SpO2: 99% 98%    Last Pain:  Vitals:   01/17/23 1130  TempSrc:   PainSc: 0-No pain                 Trevor Iha

## 2023-01-17 NOTE — H&P (Signed)
[facility] History and Physical   Primary Care Physician:  Sandford Craze, NP   Reason for Procedure:   Hx adenomatous colon polyps  Plan:    colonoscopy     HPI: Michael [name] is a 72 y.o. male w/ difficult airway in past here for polyp surveillance exam last exam 1 diminutive adenoma and had polyps before that on prior colonoscopies though we do not have exact details   Past Medical History:  Diagnosis Date   Allergy    Arthritis    Asthma    Blood transfusion without reported diagnosis    "as a child"   Cancer (HCC)    basal cell and squamous cell carcinoma   Cancer (HCC)    prostate   Cataract 2023   Clotting disorder (HCC)    Colon polyps    COPD (chronic obstructive pulmonary disease) (HCC) 1975   Difficult airway for intubation 09/28/2022   2015 at [facility]   GERD (gastroesophageal reflux disease)    Gout    High cholesterol    History of hepatitis    due to drug interaction?   History of pancreatitis 2016   ?due to colchicine   History of pulmonary embolus (PE) 2016   History of stomach ulcers    Hypertension    Hypothyroidism    Neuromuscular disorder (HCC)    restless leg   OSA (obstructive sleep apnea) 2015   has CPAP   Osteopenia    Pneumonia    Seizures (HCC)    x1 10 years ago   Sleep apnea    Thyroid disease    hypothyroid   Ulcer 1985   Urinary incontinence     Past Surgical History:  Procedure Laterality Date   BASAL CELL CARCINOMA EXCISION  2017   MOHS.  beneath right eye   BRAIN SURGERY  1967   Fractured skull in an accident   CHOLECYSTECTOMY  2001   COLON SURGERY  2001   Complication of gall bladder surgery   COLONOSCOPY     multiple - last 06/2017   COSMETIC SURGERY  2017   Basal cell cancer - MOHS surgery   HERNIA REPAIR  2001   HIP SURGERY     JOINT REPLACEMENT  06/2016   Total hip replacement - left hip   PENILE PROSTHESIS IMPLANT  2016   PROSTATECTOMY  2009   SINUS SURGERY WITH INSTATRAK   2014   SQUAMOUS CELL CARCINOMA EXCISION  01/2017   nose   TOOTH EXTRACTION     TOTAL HIP ARTHROPLASTY  06/2016    Prior to Admission medications   Medication Sig Start Date End Date Taking? Authorizing Provider  albuterol (VENTOLIN HFA) 108 (90 Base) MCG/ACT inhaler Inhale 2 puffs into the lungs every 6 (six) hours as needed for wheezing or shortness of breath. 06/09/21  Yes Ellamae Sia, DO  allopurinol (ZYLOPRIM) 100 MG tablet TAKE 1 TABLET(100 MG) BY MOUTH TWICE DAILY 08/24/22  Yes Sandford Craze, NP  amLODipine (NORVASC) 10 MG tablet Take 1 tablet (10 mg total) by mouth daily. 08/24/22  Yes Sandford Craze, NP  calcium carbonate (OS-CAL - DOSED IN MG OF ELEMENTAL CALCIUM) 1250 (500 Ca) MG tablet Take 1 tablet by mouth daily.   Yes [provider]  Cholecalciferol (VITAMIN D3) 5000 units CAPS Take by mouth daily.   Yes [provider]  fexofenadine (ALLEGRA) 180 MG tablet Take 180 mg by mouth daily.    Yes [provider]  fluticasone Aleda Grana)  50 MCG/ACT nasal spray SPRAY 2 SPRAYS INTO EACH NOSTRIL EVERY DAY 11/08/22  Yes Saguier, Ramon Dredge, PA-C  fluticasone-salmeterol (WIXELA INHUB) 500-50 MCG/ACT AEPB Inhale 1 puff into the lungs 2 (two) times daily. in the morning and at bedtime. 07/20/22  Yes Luciano Cutter, MD  hydrALAZINE (APRESOLINE) 25 MG tablet Take 1 tablet (25 mg total) by mouth in the morning and at bedtime. 12/22/22  Yes Sandford Craze, NP  ipratropium-albuterol (DUONEB) 0.5-2.5 (3) MG/3ML SOLN 1 vial in nebulizer every 4-6 hours while awake for the next 3-5 days for coughing or wheezing. 08/17/21  Yes Luciano Cutter, MD  levothyroxine (SYNTHROID) 75 MCG tablet Take 1 tablet (75 mcg total) by mouth daily before breakfast. 11/01/22  Yes Sandford Craze, NP  Magnesium 250 MG TABS Take by mouth daily.   Yes [provider]  montelukast (SINGULAIR) 10 MG tablet Take one tablet once at night for coughing or wheezing 07/20/22  Yes Luciano Cutter, MD  docusate sodium (COLACE) 100 MG capsule Take 200 mg by mouth daily.     [provider]  EPINEPHrine 0.3 mg/0.3 mL IJ SOAJ injection Use as directed for severe allergic reactions 07/20/22   Luciano Cutter, MD  predniSONE (DELTASONE) 10 MG tablet 4 tabs by mouth once daily for 3 days, then 3 tabs daily x 3 days, then 2 tabs daily x 3 days, then 1 tab daily x 3 days 12/23/22   Sandford Craze, NP  tiZANidine (ZANAFLEX) 2 MG tablet TAKE 1 TO 2 TABLETS(2 TO 4 MG) BY MOUTH AT BEDTIME AS NEEDED FOR MUSCLE SPASMS 04/17/22   Sandford Craze, NP    Current Facility-Administered Medications  Medication Dose Route Frequency Provider Last Rate Last Admin   0.9 %  sodium chloride infusion   Intravenous Continuous Iva Boop, MD       lactated ringers infusion   Intravenous Continuous Iva Boop, MD        Allergies as of 12/02/2022 - Review Complete 10/24/2022  Allergen Reaction Noted   Baclofen Shortness Of Breath 12/27/2019   Colchicine Other (See Comments) 06/24/2015   Doxycycline Hives 08/24/2015   Statins Other (See Comments) 11/18/2015   Avelox [moxifloxacin hcl in nacl] Other (See Comments) 09/11/2012   Bactrim [sulfamethoxazole-trimethoprim] Hives 05/29/2019   Cefdinir Hives 08/16/2017   Gabapentin  02/18/2022   Losartan  07/13/2020   Oregano [origanum oil] Cough 04/12/2019   Peanut-containing drug products Swelling 04/12/2019   Adhesive  [tape] Other (See Comments) 06/16/2016   Penicillins Rash 09/11/2012    Family History  Problem Relation Age of Onset   Ulcerative colitis Mother    Colon polyps Mother    Asthma Mother    Arthritis Mother    Skin cancer Mother        basal cell carcinoma   Kidney failure Mother    Atrial fibrillation Mother    Kidney disease Mother    Obesity Mother    Asthma Father    Arthritis Father    Diabetes Father    Heart attack Father    Hyperlipidemia Father    Hypertension Father    Kidney disease  Father    Obesity Father    Diabetes Brother    Arthritis Brother    Hyperlipidemia Brother    Brain cancer Brother        GBM   Cancer Brother    Alcohol abuse Brother    Breast cancer Maternal Grandmother    Cancer Maternal Grandmother  Diabetes Paternal Grandfather    Colon cancer Neg Hx    Esophageal cancer Neg Hx    Liver cancer Neg Hx    Pancreatic cancer Neg Hx    Rectal cancer Neg Hx    Stomach cancer Neg Hx    Crohn's disease Neg Hx     Social History   Socioeconomic History   Marital status: Married    Spouse name: Not on file   Number of children: 3   Years of education: Not on file   Highest education level: Bachelor's degree (e.g., BA, AB, BS)  Occupational History   Not on file  Tobacco Use   Smoking status: Never   Smokeless tobacco: Never  Vaping Use   Vaping status: Never Used  Substance and Sexual Activity   Alcohol use: Yes    Alcohol/week: 3.0 standard drinks of alcohol    Types: 3 Glasses of wine per week    Comment: occ wine   Drug use: No   Sexual activity: Yes    Birth control/protection: Surgical    Comment: Prostate removal  Other Topics Concern   Not on file  Social History Narrative   Former Engineer, agricultural at [employer], now Environmental health practitioner.    Married   3 daughters- all live locally   Has 7 grandchildren and 1 great grandchild   Enjoys reading, Environmental manager         Lives in a one story home with a bonus room over the garage.   Social Determinants of Health   Financial Resource Strain: Low Risk  (08/17/2022)   Overall Financial Resource Strain (CARDIA)    Difficulty of Paying Living Expenses: Not hard at all  Food Insecurity: No Food Insecurity (08/17/2022)   Hunger Vital Sign    Worried About Running Out of Food in the Last Year: Never true    Ran Out of Food in the Last Year: Never true  Transportation Needs: No Transportation Needs (08/17/2022)   PRAPARE - Administrator, Civil Service (Medical):  No    Lack of Transportation (Non-Medical): No  Physical Activity: Insufficiently Active (08/17/2022)   Exercise Vital Sign    Days of Exercise per Week: 2 days    Minutes of Exercise per Session: 20 min  Stress: Stress Concern Present (08/17/2022)   [employer] of Occupational Health - Occupational Stress Questionnaire    Feeling of Stress : To some extent  Social Connections: Socially Integrated (08/17/2022)   Social Connection and Isolation Panel [NHANES]    Frequency of Communication with Friends and Family: More than three times a week    Frequency of Social Gatherings with Friends and Family: Twice a week    Attends Religious Services: More than 4 times per year    Active Member of [employer] or Organizations: Yes    Attends Engineer, structural: More than 4 times per year    Marital Status: Married  Catering manager Violence: Not At Risk (08/25/2022)   Humiliation, Afraid, Rape, and Kick questionnaire    Fear of Current or Ex-Partner: No    Emotionally Abused: No    Physically Abused: No    Sexually Abused: No    Review of Systems:  All other review of systems negative except as mentioned in the HPI.  Physical Exam: Vital signs BP (!) 148/95   Pulse 70   Temp 98.7 F (37.1 C) (Temporal)   Resp (!) 21   Ht 5\' 6"  (1.676 m)  Wt 104.3 kg   SpO2 98%   BMI 37.12 kg/m   General:   Alert,  Well-developed, well-nourished, pleasant and cooperative in NAD Lungs:  Clear throughout to auscultation.   Heart:  Regular rate and rhythm; no murmurs, clicks, rubs,  or gallops. Abdomen:  Soft, nontender and nondistended. Normal bowel sounds.   Neuro/Psych:  Alert and cooperative. Normal mood and affect. A and O x 3   @Neviah Braud  Sena Slate, MD, [facility] [phone] (pager) 01/17/2023 9:34 AM@

## 2023-01-17 NOTE — Op Note (Signed)
[facility] Patient Name: Michael [name] Procedure Date: 01/17/2023 MRN: [id number] Attending MD: Iva Boop , MD, [id number] Date of Birth: [birth year] CSN: [id number] Age: 72 Admit Type: Outpatient Procedure:                Colonoscopy Indications:              Surveillance: Personal history of adenomatous                            polyps on last colonoscopy 5 years ago, Last                            colonoscopy: 2019 Providers:                Iva Boop, MD, Jacquelyn "Jaci" Clelia Croft, RN,                            Fransisca Connors, Janie Billups, Dollene Primrose CRNA Referring MD:              Medicines:                Monitored Anesthesia Care Complications:            No immediate complications. Estimated Blood Loss:     Estimated blood loss was minimal. Procedure:                Pre-Anesthesia Assessment:                           - Prior to the procedure, a History and Physical                            was performed, and patient medications and                            allergies were reviewed. The patient's tolerance of                            previous anesthesia was also reviewed. The risks                            and benefits of the procedure and the sedation                            options and risks were discussed with the patient.                            All questions were answered, and informed consent                            was obtained. Prior Anticoagulants: The patient has                            taken no anticoagulant  or antiplatelet agents. ASA                            Grade Assessment: II - A patient with mild systemic                            disease. After reviewing the risks and benefits,                            the patient was deemed in satisfactory condition to                            undergo the procedure.                           After obtaining informed consent,  the colonoscope                            was passed under direct vision. Throughout the                            procedure, the patient's blood pressure, pulse, and                            oxygen saturations were monitored continuously. The                            CF-HQ190L ([id number]) Olympus colonoscope was                            introduced through the anus and advanced to the the                            cecum, identified by appendiceal orifice and                            ileocecal valve. The colonoscopy was performed                            without difficulty. The patient tolerated the                            procedure well. The quality of the bowel                            preparation was good. The ileocecal valve,                            appendiceal orifice, and rectum were photographed.                            The bowel preparation used was Miralax via split  dose instruction. Scope In: 10:48:05 AM Scope Out: 11:07:42 AM Scope Withdrawal Time: 0 hours 14 minutes 3 seconds  Total Procedure Duration: 0 hours 19 minutes 37 seconds  Findings:      The perianal and digital rectal examinations were normal.      A 15 to 20 mm polyp was found in the transverse colon. The polyp was       semi-pedunculated. The polyp was removed with a hot snare. Resection and       retrieval were complete. Verification of patient identification for the       specimen was done. Estimated blood loss: none.      Three sessile polyps were found in the transverse colon. The polyps were       5 to 7 mm in size. These polyps were removed with a cold snare.       Resection and retrieval were complete. Verification of patient       identification for the specimen was done. Estimated blood loss was       minimal.      Diverticula were found in the sigmoid colon.      The exam was otherwise without abnormality on direct and retroflexion        views. Impression:               - One 15 to 20 mm polyp in the transverse colon,                            removed with a hot snare. Resected and retrieved.                           - Three 5 to 7 mm polyps in the transverse colon,                            removed with a cold snare. Resected and retrieved.                           - Diverticulosis in the sigmoid colon.                           - The examination was otherwise normal on direct                            and retroflexion views.                           - Personal history of colonic polyps. diminutive                            adenoma 2019 and prior polyps before that and other                            colonoscopies Moderate Sedation:      Not Applicable - Patient had care per Anesthesia. Recommendation:           - Patient has a contact number available for  emergencies. The signs and symptoms of potential                            delayed complications were discussed with the                            patient. Return to normal activities tomorrow.                            Written discharge instructions were provided to the                            patient.                           - Resume previous diet.                           - Continue present medications.                           - No aspirin, ibuprofen, naproxen, or other                            non-steroidal anti-inflammatory drugs for 2 weeks                            after polyp removal.                           - Repeat colonoscopy is recommended for                            surveillance. The colonoscopy date will be                            determined after pathology results from today's                            exam become available for review. Procedure Code(s):        --- Professional ---                           [phone], Colonoscopy, flexible; with removal of                            tumor(s),  polyp(s), or other lesion(s) by snare                            technique Diagnosis Code(s):        --- Professional ---                           D12.3, Benign neoplasm of transverse colon (hepatic  flexure or splenic flexure)                           Z86.010, Personal history of colonic polyps                           K57.30, Diverticulosis of large intestine without                            perforation or abscess without bleeding CPT copyright 2022 American Medical Association. All rights reserved. The codes documented in this report are preliminary and upon coder review may  be revised to meet current compliance requirements. Iva Boop, MD 01/17/2023 11:27:03 AM This report has been signed electronically. Number of Addenda: 0

## 2023-01-17 NOTE — Anesthesia Procedure Notes (Signed)
Procedure Name: MAC Date/Time: 01/17/2023 10:38 AM  Performed by: Orest Dikes, CRNAPre-anesthesia Checklist: Patient identified, Emergency Drugs available, Suction available and Patient being monitored Oxygen Delivery Method: Simple face mask

## 2023-01-17 NOTE — Discharge Instructions (Addendum)
I found and removed 4 polyps today. 3 small and one medium-large. All look benign. I will let you know pathology results and when to have another routine colonoscopy by mail and/or My Chart.  Do not take aspirin, ibuprofen or Aleve (naproxen).  I appreciate the opportunity to care for you. Iva Boop, MD, FACG YOU HAD AN ENDOSCOPIC PROCEDURE TODAY: Refer to the procedure report and other information in the discharge instructions given to you for any specific questions about what was found during the examination. If this information does not answer your questions, please call [place] office at [phone] to clarify.   YOU SHOULD EXPECT: Some feelings of bloating in the abdomen. Passage of more gas than usual. Walking can help get rid of the air that was put into your GI tract during the procedure and reduce the bloating. If you had a lower endoscopy (such as a colonoscopy or flexible sigmoidoscopy) you may notice spotting of blood in your stool or on the toilet paper. Some abdominal soreness may be present for a day or two, also.  DIET: Your first meal following the procedure should be a light meal and then it is ok to progress to your normal diet. A half-sandwich or bowl of soup is an example of a good first meal. Heavy or fried foods are harder to digest and may make you feel nauseous or bloated. Drink plenty of fluids but you should avoid alcoholic beverages for 24 hours. If you had a esophageal dilation, please see attached instructions for diet.    ACTIVITY: Your care partner should take you home directly after the procedure. You should plan to take it easy, moving slowly for the rest of the day. You can resume normal activity the day after the procedure however YOU SHOULD NOT DRIVE, use power tools, machinery or perform tasks that involve climbing or major physical exertion for 24 hours (because of the sedation medicines used during the test).   SYMPTOMS TO REPORT IMMEDIATELY: A  gastroenterologist can be reached at any hour. Please call [phone]  for any of the following symptoms:  Following lower endoscopy (colonoscopy, flexible sigmoidoscopy) Excessive amounts of blood in the stool  Significant tenderness, worsening of abdominal pains  Swelling of the abdomen that is new, acute  Fever of 100 or higher  Following upper endoscopy (EGD, EUS, ERCP, esophageal dilation) Vomiting of blood or coffee ground material  New, significant abdominal pain  New, significant chest pain or pain under the shoulder blades  Painful or persistently difficult swallowing  New shortness of breath  Black, tarry-looking or red, bloody stools  FOLLOW UP:  If any biopsies were taken you will be contacted by phone or by letter within the next 1-3 weeks. Call [phone]  if you have not heard about the biopsies in 3 weeks.  Please also call with any specific questions about appointments or follow up tests.YOU HAD AN ENDOSCOPIC PROCEDURE TODAY: Refer to the procedure report and other information in the discharge instructions given to you for any specific questions about what was found during the examination. If this information does not answer your questions, please call [place] office at [phone] to clarify.   YOU SHOULD EXPECT: Some feelings of bloating in the abdomen. Passage of more gas than usual. Walking can help get rid of the air that was put into your GI tract during the procedure and reduce the bloating. If you had a lower endoscopy (such as a colonoscopy or flexible sigmoidoscopy) you may notice spotting  of blood in your stool or on the toilet paper. Some abdominal soreness may be present for a day or two, also.  DIET: Your first meal following the procedure should be a light meal and then it is ok to progress to your normal diet. A half-sandwich or bowl of soup is an example of a good first meal. Heavy or fried foods are harder to digest and may make you feel nauseous or  bloated. Drink plenty of fluids but you should avoid alcoholic beverages for 24 hours. If you had a esophageal dilation, please see attached instructions for diet.    ACTIVITY: Your care partner should take you home directly after the procedure. You should plan to take it easy, moving slowly for the rest of the day. You can resume normal activity the day after the procedure however YOU SHOULD NOT DRIVE, use power tools, machinery or perform tasks that involve climbing or major physical exertion for 24 hours (because of the sedation medicines used during the test).   SYMPTOMS TO REPORT IMMEDIATELY: A gastroenterologist can be reached at any hour. Please call [phone]  for any of the following symptoms:  Following lower endoscopy (colonoscopy, flexible sigmoidoscopy) Excessive amounts of blood in the stool  Significant tenderness, worsening of abdominal pains  Swelling of the abdomen that is new, acute  Fever of 100 or higher  Following upper endoscopy (EGD, EUS, ERCP, esophageal dilation) Vomiting of blood or coffee ground material  New, significant abdominal pain  New, significant chest pain or pain under the shoulder blades  Painful or persistently difficult swallowing  New shortness of breath  Black, tarry-looking or red, bloody stools  FOLLOW UP:  If any biopsies were taken you will be contacted by phone or by letter within the next 1-3 weeks. Call [phone]  if you have not heard about the biopsies in 3 weeks.  Please also call with any specific questions about appointments or follow up tests.

## 2023-01-17 NOTE — Transfer of Care (Signed)
Immediate Anesthesia Transfer of Care Note  Patient: Michael [name]  Procedure(s) Performed: COLONOSCOPY WITH PROPOFOL POLYPECTOMY  Patient Location: PACU  Anesthesia Type:MAC  Level of Consciousness: awake, oriented, and patient cooperative  Airway & Oxygen Therapy: Patient Spontanous Breathing and Patient connected to face mask oxygen  Post-op Assessment: Report given to RN and Post -op Vital signs reviewed and stable  Post vital signs: Reviewed and stable  Last Vitals:  Vitals Value Taken Time  BP 121/72 01/17/23 1115  Temp    Pulse 63 01/17/23 1116  Resp 18 01/17/23 1116  SpO2 100 % 01/17/23 1116  Vitals shown include unfiled device data.  Last Pain:  Vitals:   01/17/23 0924  TempSrc: Temporal  PainSc: 3          Complications: No notable events documented.

## 2023-01-18 ENCOUNTER — Encounter: Payer: Self-pay | Admitting: Internal Medicine

## 2023-01-18 LAB — SURGICAL PATHOLOGY

## 2023-01-19 ENCOUNTER — Encounter (HOSPITAL_COMMUNITY): Payer: Self-pay | Admitting: Internal Medicine

## 2023-01-24 ENCOUNTER — Ambulatory Visit (INDEPENDENT_AMBULATORY_CARE_PROVIDER_SITE_OTHER): Payer: PPO | Admitting: Family

## 2023-01-24 ENCOUNTER — Other Ambulatory Visit: Payer: Self-pay

## 2023-01-24 ENCOUNTER — Ambulatory Visit: Payer: PPO | Attending: Sports Medicine

## 2023-01-24 VITALS — BP 138/77 | HR 62 | Temp 97.8°F | Resp 16 | Wt 236.0 lb

## 2023-01-24 DIAGNOSIS — G8929 Other chronic pain: Secondary | ICD-10-CM | POA: Diagnosis not present

## 2023-01-24 DIAGNOSIS — R29898 Other symptoms and signs involving the musculoskeletal system: Secondary | ICD-10-CM | POA: Diagnosis not present

## 2023-01-24 DIAGNOSIS — I10 Essential (primary) hypertension: Secondary | ICD-10-CM | POA: Diagnosis not present

## 2023-01-24 DIAGNOSIS — M898X1 Other specified disorders of bone, shoulder: Secondary | ICD-10-CM

## 2023-01-24 DIAGNOSIS — M546 Pain in thoracic spine: Secondary | ICD-10-CM | POA: Insufficient documentation

## 2023-01-24 DIAGNOSIS — J4550 Severe persistent asthma, uncomplicated: Secondary | ICD-10-CM | POA: Diagnosis not present

## 2023-01-24 DIAGNOSIS — Z23 Encounter for immunization: Secondary | ICD-10-CM | POA: Diagnosis not present

## 2023-01-24 DIAGNOSIS — G4733 Obstructive sleep apnea (adult) (pediatric): Secondary | ICD-10-CM

## 2023-01-24 DIAGNOSIS — M1A9XX0 Chronic gout, unspecified, without tophus (tophi): Secondary | ICD-10-CM | POA: Diagnosis not present

## 2023-01-24 DIAGNOSIS — M5030 Other cervical disc degeneration, unspecified cervical region: Secondary | ICD-10-CM | POA: Insufficient documentation

## 2023-01-24 DIAGNOSIS — E039 Hypothyroidism, unspecified: Secondary | ICD-10-CM | POA: Diagnosis not present

## 2023-01-24 DIAGNOSIS — M5412 Radiculopathy, cervical region: Secondary | ICD-10-CM | POA: Insufficient documentation

## 2023-01-24 DIAGNOSIS — M47812 Spondylosis without myelopathy or radiculopathy, cervical region: Secondary | ICD-10-CM | POA: Diagnosis not present

## 2023-01-24 DIAGNOSIS — L989 Disorder of the skin and subcutaneous tissue, unspecified: Secondary | ICD-10-CM | POA: Diagnosis not present

## 2023-01-24 LAB — BASIC METABOLIC PANEL
BUN: 12 mg/dL (ref 6–23)
CO2: 28 meq/L (ref 19–32)
Calcium: 9.2 mg/dL (ref 8.4–10.5)
Chloride: 104 meq/L (ref 96–112)
Creatinine, Ser: 1.08 mg/dL (ref 0.40–1.50)
GFR: 69.43 mL/min (ref >60.00)
Glucose, Bld: 109 mg/dL — ABNORMAL HIGH (ref 70–99)
Potassium: 3.8 meq/L (ref 3.5–5.1)
Sodium: 139 meq/L (ref 135–145)

## 2023-01-24 LAB — TSH: TSH: 3.51 u[IU]/mL (ref 0.35–5.50)

## 2023-01-24 MED ORDER — AMLODIPINE BESYLATE 5 MG PO TABS
5.0000 mg | ORAL_TABLET | Freq: Two times a day (BID) | ORAL | 1 refills | Status: DC
Start: 2023-01-24 — End: 2023-04-17

## 2023-01-24 NOTE — Assessment & Plan Note (Addendum)
Mild left foot discomfort- managing with diet and allopurinol.  Advised OK to increase to allopurinol 200mg  AM and 100mg  PM for a few days during flare then can return to 100mg  bid as needed.

## 2023-01-24 NOTE — Assessment & Plan Note (Signed)
On amlodipine and hydralazine. BP at goal.

## 2023-01-24 NOTE — Assessment & Plan Note (Signed)
New. Recommend that he show this to his dermatologist at his upcoming appointment.

## 2023-01-24 NOTE — Assessment & Plan Note (Signed)
Maintained on CPAP

## 2023-01-24 NOTE — Assessment & Plan Note (Signed)
Stable/improved. Continue Wixela.

## 2023-01-24 NOTE — Therapy (Signed)
OUTPATIENT PHYSICAL THERAPY CERVICAL EVALUATION   Patient Name: Michael [name] MRN: [id number] DOB:25-Sep-1950, 72 y.o., male Today's Date: 01/24/2023  END OF SESSION:  PT End of Session - 01/24/23 0846     Visit Number 1    Date for PT Re-Evaluation 03/21/23    Progress Note Due on Visit 10    PT Start Time [phone]    PT Stop Time 0930    PT Time Calculation (min) 46 min    Activity Tolerance Patient tolerated treatment well    Behavior During Therapy [facility] for tasks assessed/performed             Past Medical History:  Diagnosis Date   Allergy    Arthritis    Asthma    Blood transfusion without reported diagnosis    "as a child"   Cancer (HCC)    basal cell and squamous cell carcinoma   Cancer (HCC)    prostate   Cataract 2023   Clotting disorder (HCC)    Colon polyps    COPD (chronic obstructive pulmonary disease) (HCC) 1975   Difficult airway for intubation 09/28/2022   2015 at [facility]   GERD (gastroesophageal reflux disease)    Gout    High cholesterol    History of hepatitis    due to drug interaction?   History of pancreatitis 2016   ?due to colchicine   History of pulmonary embolus (PE) 2016   History of stomach ulcers    Hypertension    Hypothyroidism    Neuromuscular disorder (HCC)    restless leg   OSA (obstructive sleep apnea) 2015   has CPAP   Osteopenia    Pneumonia    Seizures (HCC)    x1 10 years ago   Sleep apnea    Thyroid disease    hypothyroid   Ulcer 1985   Urinary incontinence    Past Surgical History:  Procedure Laterality Date   BASAL CELL CARCINOMA EXCISION  2017   MOHS.  beneath right eye   BRAIN SURGERY  1967   Fractured skull in an accident   CHOLECYSTECTOMY  2001   COLON SURGERY  2001   Complication of gall bladder surgery   COLONOSCOPY     multiple - last 06/2017   COLONOSCOPY WITH PROPOFOL N/A 01/17/2023   Procedure: COLONOSCOPY WITH PROPOFOL;  Surgeon: Iva Boop, MD;  Location: [facility];  Service:  Gastroenterology;  Laterality: N/A;   COSMETIC SURGERY  2017   Basal cell cancer - MOHS surgery   HERNIA REPAIR  2001   HIP SURGERY     JOINT REPLACEMENT  06/2016   Total hip replacement - left hip   PENILE PROSTHESIS IMPLANT  2016   POLYPECTOMY  01/17/2023   Procedure: POLYPECTOMY;  Surgeon: Iva Boop, MD;  Location: [facility];  Service: Gastroenterology;;   PROSTATECTOMY  2009   SINUS SURGERY WITH INSTATRAK  2014   SQUAMOUS CELL CARCINOMA EXCISION  01/2017   nose   TOOTH EXTRACTION     TOTAL HIP ARTHROPLASTY  06/2016   Patient Active Problem List   Diagnosis Date Noted   Skin lesion 01/24/2023   Benign neoplasm of transverse colon 01/17/2023   Personal history of colonic polyps 01/17/2023   Chronic scapular pain 12/23/2022   Difficult airway for intubation 09/28/2022   RLS (restless legs syndrome) 07/14/2021   Bradycardia 02/12/2021   Tremor of right hand 10/21/2020   Seasonal allergic conjunctivitis 06/16/2020   Gastroesophageal reflux disease 02/16/2020  Disorder of respiratory system exacerbated by aspirin 04/12/2019   Adverse food reaction 04/12/2019   Left knee pain 08/22/2017   Acute sinusitis 06/08/2017   Hypothyroid 03/24/2017   Hyperlipidemia 03/24/2017   Gout 03/24/2017   Squamous cell carcinoma 03/24/2017   History of pulmonary embolism 03/24/2017   History of prostate cancer 03/24/2017   OSA (obstructive sleep apnea) 03/24/2017   Hypertension 03/24/2017   Severe persistent asthma 02/09/2017   Other allergic rhinitis 02/09/2017   Recurrent sinusitis 02/09/2017   History of nasal polyp 02/09/2017   Allergy to multiple antibiotics 02/09/2017    PCP: Val EagleDaryel Gerald, MD  REFERRING PROVIDER: Reino Bellis, DO  REFERRING DIAG: L cervical radiculopathy  THERAPY DIAG:  Radiculopathy, cervical region  Pain in thoracic spine  Weakness of left shoulder  Rationale for Evaluation and Treatment: Rehabilitation  ONSET DATE: x 5 years,  progressive over last 6 months  SUBJECTIVE:                                                                                                                                                                                                         SUBJECTIVE STATEMENT: Consistent pain L shoulder blade along the border, have to get my spine in a neutral position prior to going to sleep,wake up with L arm up under pillow, have to reposition with it across my body.  Really hurts to sit in recliner and try to read, holding the book or kindle while looking down hurts. Also B hands go numb with reading in this position.  I work with 2 computer monitors, have had ergonomic eval and new desk chair, work doesn't seem to trigger pain , sitting and watching tv doesn't seem to trigger pain. Hand dominance: Right  PERTINENT HISTORY:  Progressive pain L shoulder blade, worsened last 6 months, therefore sought help from MD,referred to PT for probable facet jt arthropathy and radiculopathy  PAIN:  Are you having pain? Yes: NPRS scale: 3 to6/10 Pain location: L medial scapular border Pain description: constant Aggravating factors: recliner for reading, lying down, bending head forward Relieving factors: muscle relaxers at times  PRECAUTIONS: None  RED FLAGS: None     WEIGHT BEARING RESTRICTIONS: No  FALLS:  Has patient fallen in last 6 months? 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  DIAGNOSTIC FINDINGS:  Na   PATIENT SURVEYS:  NDI 15/50  COGNITION: Overall cognitive status: Within functional limits for tasks assessed  SENSATION: Wfl with test today but reports intermittent  numbness B hands with prolonged reading  POSTURE: rounded shoulders, increased thoracic kyphosis, and depressed L shoulder  PALPATION: Non tender over l medial scapular border, rotator cuff musculature.  Very tender with light palpation along C 7 to T 6 spinous processes, and with PA segmental testing, more tender C7, T1, T2 levels. Very tender with replication of familiar pain L 1st rib/upper traps, and L levator scapulae   CERVICAL ROM:   Active ROM A/PROM (deg) eval  Flexion 60 p!  Extension 60 p!  Right lateral flexion nt  Left lateral flexion nt  Right rotation 70 p!  Left rotation wnl   THORACIC SPINE: flex, pain, 80%, ext wnl, rotation 80% with pain terminal ranges UPPER EXTREMITY ROM: All B shoulder AROM WNL UPPER EXTREMITY MMT: Noted poor control of L GH jt with seated rows ex with theraband  MMT Right eval Left eval  Shoulder flexion  4+  Shoulder extension    Shoulder abduction    Shoulder adduction    Shoulder extension    Shoulder internal rotation    Shoulder external rotation    Middle trapezius  4-  Lower trapezius  4-  Elbow flexion  4+  Elbow extension    Wrist flexion    Wrist extension    Wrist ulnar deviation    Wrist radial deviation    Wrist pronation    Wrist supination    Grip strength     (Blank rows =wnl)  CERVICAL SPECIAL TESTS:  Cranial cervical flexion test: Negative, Spurling's test: Positive, and Distraction test: Negative  TODAY'S TREATMENT:                                                                                                                              DATE: 01/24/23: Evaluation:  Therex:  inst in various therex to improve mechanics and reduce compressive forces lower cervical spine:  Supine for pec stretch over 1/2 thoracic foam roller, combined with pec flys and B shoulder flexion Seated for SNAG's L T 1/2, combined with neck flexion Seated rows, hands vertical with green t band, advised to perform in small  increments during day to assist with proprioception/muscle memory    PATIENT EDUCATION:  Education details: POC, goals Person educated: Patient Education method: Explanation, Demonstration, Tactile cues, and Verbal cues Education comprehension: verbalized understanding, returned demonstration, verbal cues required, tactile cues required, and needs further education  HOME EXERCISE PROGRAM: TBD  ASSESSMENT:  CLINICAL IMPRESSION: Patient is a 72 y.o. male who was was evaluated today by skilled  physical therapy for L cervical radiculopathy.  His pain is consistent along L medial scapular border.  No pain with palpation over this area but consistent pain/ discomfort C7 to T 6.  Pain intensifies with cervical flexion and R rotation.  Very tender and irritable L levator scapulae and upper traps,L first rib.  Also noted poor mechanics, stability L shoulder with instruction in therex today, pt reports avulsion of L biceps several years ago. Recommend skilled PT to address his pain, mechanics of spine, and L shoulder.   OBJECTIVE IMPAIRMENTS: decreased ROM, decreased strength, hypomobility, increased fascial restrictions, impaired flexibility, impaired sensation, impaired UE functional use, postural dysfunction, and pain.   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   REHAB POTENTIAL: Good  CLINICAL DECISION MAKING: Evolving/moderate complexity  EVALUATION COMPLEXITY: Moderate   GOALS: Goals reviewed with patient? Yes  SHORT TERM GOALS: Target date: 2 weeks, 02/07/23  I HEP Baseline:  Goal status: INITIAL   LONG TERM GOALS: Target date: 8 weeks, 03/21/23  NDI improve to 8/50 from  15/50 Baseline:  Goal status: INITIAL  2.  Pain free full cervical spine flexion  Baseline: 70% with heightened pain L medial scapula Goal status: INITIAL  3.  Increase strength L middle and lower traps to 5/5, symmetrical with R, for improved B UE stability for reaching forward Baseline: 4- Goal status: INITIAL  4.  Improve sleep patterns , patient have no sleep interruption consistently Baseline: wakes frequently Goal status: INITIAL     PLAN:  PT FREQUENCY: 1x/week  PT DURATION: 8 weeks  PLANNED INTERVENTIONS: Therapeutic exercises, Therapeutic activity, Neuromuscular re-education, Balance training, Gait training, Patient/Family education, Self Care, and Joint mobilization  PLAN FOR NEXT SESSION: TPDN L upper traps, levator, lower cervical and upper thoracic paraspinals, progress with strengthening middle, lower traps, progress with NAG's and SNAGs, jt mobs upper and middle thoracic   Spenser Harren L Karna Abed, PT, DPT, OCS 01/24/2023, 10:41 AM

## 2023-01-24 NOTE — Assessment & Plan Note (Signed)
Lab Results  Component Value Date   TSH 1.70 07/04/2022   Clinically stable.  Update TSH, continue synthroid.

## 2023-01-24 NOTE — Progress Notes (Signed)
Subjective:     Patient ID: Michael [name], male    DOB: 15-Aug-1950, 72 y.o.   MRN: [id number]  Chief Complaint  Patient presents with   Hypertension    Here for follow up    Hypertension    Discussed the use of AI scribe software for clinical note transcription with the patient, who gave verbal consent to proceed.  History of Present Illness   Michael [name], a patient with a history of severe asthma, gout, and musculoskeletal pain, presents for a follow-up visit. The patient reports significant improvement in breathing since the last visit, attributing this to a long prednisone taper. Despite the current ragweed season, which typically exacerbates his allergies, the patient's respiratory status remains stable, with no need for frequent Duoneb use.  His musculoskeletal pain, localized over the scapula region, has been evaluated by Dr. Margaretha Sheffield, who suspects a pinched nerve. The patient reports a history of neck problems and is scheduled to see a physical therapist, Amy, for further evaluation and management. He expresses a preference for non-invasive interventions to create more space around the nerve and alleviate the pain.  The patient also mentions a persistent issue with gout, which he manages by avoiding certain foods and taking allopurinol. He inquires about the possibility of occasionally increasing the dose of allopurinol during flare-ups. He is allergic to colchicine.   Additionally, he notes a new skin finding on his left shin. He has an appointment with his dermatologist in November. He also mentions having had COVID-19 and is considering getting a booster shot after the recommended 90-day waiting period.     HTN- maintained on amlodipine and hydralazine. BP Readings from Last 3 Encounters:  01/24/23 138/77  01/17/23 132/87  01/11/23 138/80        Health Maintenance Due  Topic Date Due   COVID-19 Vaccine (7 - 2023-24 season) 12/25/2022    Past Medical History:   Diagnosis Date   Allergy    Arthritis    Asthma    Blood transfusion without reported diagnosis    "as a child"   Cancer (HCC)    basal cell and squamous cell carcinoma   Cancer (HCC)    prostate   Cataract 2023   Clotting disorder (HCC)    Colon polyps    COPD (chronic obstructive pulmonary disease) (HCC) 1975   Difficult airway for intubation 09/28/2022   2015 at [facility]   GERD (gastroesophageal reflux disease)    Gout    High cholesterol    History of hepatitis    due to drug interaction?   History of pancreatitis 2016   ?due to colchicine   History of pulmonary embolus (PE) 2016   History of stomach ulcers    Hypertension    Hypothyroidism    Neuromuscular disorder (HCC)    restless leg   OSA (obstructive sleep apnea) 2015   has CPAP   Osteopenia    Pneumonia    Seizures (HCC)    x1 10 years ago   Sleep apnea    Thyroid disease    hypothyroid   Ulcer 1985   Urinary incontinence     Past Surgical History:  Procedure Laterality Date   BASAL CELL CARCINOMA EXCISION  2017   MOHS.  beneath right eye   BRAIN SURGERY  1967   Fractured skull in an accident   CHOLECYSTECTOMY  2001   COLON SURGERY  2001   Complication of gall bladder surgery   COLONOSCOPY  multiple - last 06/2017   COLONOSCOPY WITH PROPOFOL N/A 01/17/2023   Procedure: COLONOSCOPY WITH PROPOFOL;  Surgeon: Iva Boop, MD;  Location: [facility];  Service: Gastroenterology;  Laterality: N/A;   COSMETIC SURGERY  2017   Basal cell cancer - MOHS surgery   HERNIA REPAIR  2001   HIP SURGERY     JOINT REPLACEMENT  06/2016   Total hip replacement - left hip   PENILE PROSTHESIS IMPLANT  2016   POLYPECTOMY  01/17/2023   Procedure: POLYPECTOMY;  Surgeon: Iva Boop, MD;  Location: [facility];  Service: Gastroenterology;;   PROSTATECTOMY  2009   SINUS SURGERY WITH INSTATRAK  2014   SQUAMOUS CELL CARCINOMA EXCISION  01/2017   nose   TOOTH EXTRACTION     TOTAL HIP ARTHROPLASTY  06/2016     Family History  Problem Relation Age of Onset   Ulcerative colitis Mother    Colon polyps Mother    Asthma Mother    Arthritis Mother    Skin cancer Mother        basal cell carcinoma   Kidney failure Mother    Atrial fibrillation Mother    Kidney disease Mother    Obesity Mother    Asthma Father    Arthritis Father    Diabetes Father    Heart attack Father    Hyperlipidemia Father    Hypertension Father    Kidney disease Father    Obesity Father    Diabetes Brother    Arthritis Brother    Hyperlipidemia Brother    Brain cancer Brother        GBM   Cancer Brother    Alcohol abuse Brother    Breast cancer Maternal Grandmother    Cancer Maternal Grandmother    Diabetes Paternal Grandfather    Colon cancer Neg Hx    Esophageal cancer Neg Hx    Liver cancer Neg Hx    Pancreatic cancer Neg Hx    Rectal cancer Neg Hx    Stomach cancer Neg Hx    Crohn's disease Neg Hx     Social History   Socioeconomic History   Marital status: Married    Spouse name: Not on file   Number of children: 3   Years of education: Not on file   Highest education level: Bachelor's degree (e.g., BA, AB, BS)  Occupational History   Not on file  Tobacco Use   Smoking status: Never   Smokeless tobacco: Never  Vaping Use   Vaping status: Never Used  Substance and Sexual Activity   Alcohol use: Yes    Alcohol/week: 3.0 standard drinks of alcohol    Types: 3 Glasses of wine per week    Comment: occ wine   Drug use: No   Sexual activity: Yes    Birth control/protection: Surgical    Comment: Prostate removal  Other Topics Concern   Not on file  Social History Narrative   Former Engineer, agricultural at [employer], now Environmental health practitioner.    Married   3 daughters- all live locally   Has 7 grandchildren and 1 great grandchild   Enjoys reading, Environmental manager         Lives in a one story home with a bonus room over the garage.   Social Determinants of Health   Financial  Resource Strain: Low Risk  (08/17/2022)   Overall Financial Resource Strain (CARDIA)    Difficulty of Paying Living Expenses: Not hard at all  Food Insecurity: No Food Insecurity (08/17/2022)   Hunger Vital Sign    Worried About Running Out of Food in the Last Year: Never true    Ran Out of Food in the Last Year: Never true  Transportation Needs: No Transportation Needs (08/17/2022)   PRAPARE - Administrator, Civil Service (Medical): No    Lack of Transportation (Non-Medical): No  Physical Activity: Insufficiently Active (08/17/2022)   Exercise Vital Sign    Days of Exercise per Week: 2 days    Minutes of Exercise per Session: 20 min  Stress: Stress Concern Present (08/17/2022)   [employer] of Occupational Health - Occupational Stress Questionnaire    Feeling of Stress : To some extent  Social Connections: Socially Integrated (08/17/2022)   Social Connection and Isolation Panel [NHANES]    Frequency of Communication with Friends and Family: More than three times a week    Frequency of Social Gatherings with Friends and Family: Twice a week    Attends Religious Services: More than 4 times per year    Active Member of [employer] or Organizations: Yes    Attends Engineer, structural: More than 4 times per year    Marital Status: Married  Catering manager Violence: Not At Risk (08/25/2022)   Humiliation, Afraid, Rape, and Kick questionnaire    Fear of Current or Ex-Partner: No    Emotionally Abused: No    Physically Abused: No    Sexually Abused: No    Outpatient Medications Prior to Visit  Medication Sig Dispense Refill   albuterol (VENTOLIN HFA) 108 (90 Base) MCG/ACT inhaler Inhale 2 puffs into the lungs every 6 (six) hours as needed for wheezing or shortness of breath. 18 g 1   allopurinol (ZYLOPRIM) 100 MG tablet TAKE 1 TABLET(100 MG) BY MOUTH TWICE DAILY 180 tablet 1   calcium carbonate (OS-CAL - DOSED IN MG OF ELEMENTAL CALCIUM) 1250 (500 Ca) MG tablet Take 1  tablet by mouth daily.     Cholecalciferol (VITAMIN D3) 5000 units CAPS Take by mouth daily.     docusate sodium (COLACE) 100 MG capsule Take 200 mg by mouth daily.      EPINEPHrine 0.3 mg/0.3 mL IJ SOAJ injection Use as directed for severe allergic reactions 2 each 2   fexofenadine (ALLEGRA) 180 MG tablet Take 180 mg by mouth daily.      fluticasone (FLONASE) 50 MCG/ACT nasal spray SPRAY 2 SPRAYS INTO EACH NOSTRIL EVERY DAY 48 mL 1   fluticasone-salmeterol (WIXELA INHUB) 500-50 MCG/ACT AEPB Inhale 1 puff into the lungs 2 (two) times daily. in the morning and at bedtime. 60 each 5   hydrALAZINE (APRESOLINE) 25 MG tablet Take 1 tablet (25 mg total) by mouth in the morning and at bedtime. 180 tablet 1   ipratropium-albuterol (DUONEB) 0.5-2.5 (3) MG/3ML SOLN 1 vial in nebulizer every 4-6 hours while awake for the next 3-5 days for coughing or wheezing. 360 mL 1   levothyroxine (SYNTHROID) 75 MCG tablet Take 1 tablet (75 mcg total) by mouth daily before breakfast. 90 tablet 1   Magnesium 250 MG TABS Take by mouth daily.     montelukast (SINGULAIR) 10 MG tablet Take one tablet once at night for coughing or wheezing 90 tablet 3   tiZANidine (ZANAFLEX) 2 MG tablet TAKE 1 TO 2 TABLETS(2 TO 4 MG) BY MOUTH AT BEDTIME AS NEEDED FOR MUSCLE SPASMS 60 tablet 1   amLODipine (NORVASC) 10 MG tablet Take 1 tablet (10 mg total)  by mouth daily. 90 tablet 1   No facility-administered medications prior to visit.    Allergies  Allergen Reactions   Baclofen Shortness Of Breath    Asthma exacerbation   Colchicine Other (See Comments)    Pancreatitis   Doxycycline Hives   Statins Other (See Comments)    Joint stiffness   Avelox [Moxifloxacin Hcl In Nacl] Other (See Comments)    SEIZURE   Bactrim [Sulfamethoxazole-Trimethoprim] Hives   Cefdinir Hives   Gabapentin     Eye twitch and difficult focusing his vision   Losartan     Rash    Oregano [Origanum Oil] Cough   Peanut-Containing Drug Products Swelling     Throat swelling   Adhesive  [Tape] Other (See Comments)    "tears skin"   Penicillins Rash    ROS See HPI    Objective:    Physical Exam Constitutional:      General: He is not in acute distress.    Appearance: He is well-developed.  HENT:     Head: Normocephalic and atraumatic.  Cardiovascular:     Rate and Rhythm: Normal rate and regular rhythm.     Heart sounds: No murmur heard. Pulmonary:     Effort: Pulmonary effort is normal. No respiratory distress.     Breath sounds: Normal breath sounds. Decreased air movement (at bases) present. No wheezing or rales.  Skin:    General: Skin is warm and dry.     Comments: Raised erythematous lesion left shin  Neurological:     Mental Status: He is alert and oriented to person, place, and time.  Psychiatric:        Behavior: Behavior normal.        Thought Content: Thought content normal.      BP 138/77 (BP Location: Right Arm, Patient Position: Sitting, Cuff Size: Small)   Pulse 62   Temp 97.8 F (36.6 C) (Oral)   Resp 16   Wt 236 lb (107 kg)   SpO2 97%   BMI 38.09 kg/m  Wt Readings from Last 3 Encounters:  01/24/23 236 lb (107 kg)  01/17/23 230 lb (104.3 kg)  01/11/23 235 lb (106.6 kg)       Assessment & Plan:   Problem List Items Addressed This Visit       Unprioritized   Hypertension (Chronic)    On amlodipine and hydralazine. BP at goal.       Relevant Medications   amLODipine (NORVASC) 5 MG tablet   Other Relevant Orders   Basic Metabolic Panel (BMET)   Skin lesion    New. Recommend that he show this to his dermatologist at his upcoming appointment.       Severe persistent asthma - Primary    Stable/improved. Continue Wixela.       OSA (obstructive sleep apnea)    Maintained on CPAP.       Hypothyroid    Lab Results  Component Value Date   TSH 1.70 07/04/2022   Clinically stable.  Update TSH, continue synthroid.       Relevant Orders   TSH   Gout    Mild left foot discomfort-  managing with diet and allopurinol.  Advised OK to increase to allopurinol 200mg  AM and 100mg  PM for a few days during flare then can return to 100mg  bid as needed.       Chronic scapular pain    Ongoing- about to begin PT.  Hopefully he will find this helpful.  Other Visit Diagnoses     Needs flu shot       Relevant Orders   Flu Vaccine Trivalent High Dose (Fluad) (Completed)       I have discontinued Lesia Hausen. Oboyle "Michael [name]"'s amLODipine. I am also having him start on amLODipine. Additionally, I am having him maintain his Vitamin D3, docusate sodium, fexofenadine, Magnesium, calcium carbonate, albuterol, ipratropium-albuterol, tiZANidine, fluticasone-salmeterol, montelukast, EPINEPHrine, allopurinol, levothyroxine, fluticasone, and hydrALAZINE.  Meds ordered this encounter  Medications   amLODipine (NORVASC) 5 MG tablet    Sig: Take 1 tablet (5 mg total) by mouth 2 (two) times daily.    Dispense:  180 tablet    Refill:  1    Order Specific Question:   Supervising Provider    Answer:   Danise Edge A [[id number]]

## 2023-01-24 NOTE — Patient Instructions (Signed)
VISIT SUMMARY:  Dear Michael [name], it was great to see you for your follow-up visit. I'm glad to hear that your breathing has improved and your allergies are under control. We discussed your ongoing shoulder pain, your gout, and a new skin finding. We also reviewed your medications for high blood pressure and thyroid disorder.  YOUR PLAN:  -ASTHMA: Your asthma symptoms have improved after a long course of prednisone. Continue with your current management plan.  -MUSCULOSKELETAL PAIN: Your shoulder pain is likely due to a pinched nerve. Continue with your physical therapy and follow-up with Dr. Margaretha Sheffield.  -GOUT: You have mild symptoms of gout in your left foot. During flare-ups, you can increase your Allopurinol dose to 200mg  in the morning and 100mg  in the evening.  -HYPERTENSION: Your blood pressure is well-controlled with your current medications. Continue taking Hydralazine and Amlodipine as prescribed.  -THYROID DISORDER: You are currently on Synthroid for your thyroid disorder. We will check your TSH and kidney function today and refill your Synthroid prescription based on the results.  -SKIN LESION: You noted a new skin lesion. I am referring you back to your dermatologist for further evaluation of this lesion.  -GENERAL HEALTH MAINTENANCE: We administered a high-dose influenza vaccine today.   INSTRUCTIONS:  Please continue with your current medications and treatments as discussed. Remember to increase your Allopurinol dose during gout flare-ups. Follow up with Dr. Margaretha Sheffield for your shoulder pain and see a dermatologist for your skin lesion. We will check your TSH and kidney function today and adjust your Synthroid prescription as needed.

## 2023-01-24 NOTE — Assessment & Plan Note (Signed)
Ongoing- about to begin PT.  Hopefully he will find this helpful.

## 2023-01-25 ENCOUNTER — Other Ambulatory Visit: Payer: Self-pay | Admitting: Family

## 2023-01-25 DIAGNOSIS — J309 Allergic rhinitis, unspecified: Secondary | ICD-10-CM | POA: Diagnosis not present

## 2023-01-25 DIAGNOSIS — G4733 Obstructive sleep apnea (adult) (pediatric): Secondary | ICD-10-CM | POA: Diagnosis not present

## 2023-01-25 DIAGNOSIS — E038 Other specified hypothyroidism: Secondary | ICD-10-CM | POA: Diagnosis not present

## 2023-01-25 DIAGNOSIS — J441 Chronic obstructive pulmonary disease with (acute) exacerbation: Secondary | ICD-10-CM | POA: Diagnosis not present

## 2023-01-25 DIAGNOSIS — H259 Unspecified age-related cataract: Secondary | ICD-10-CM | POA: Diagnosis not present

## 2023-01-25 DIAGNOSIS — I509 Heart failure, unspecified: Secondary | ICD-10-CM | POA: Diagnosis not present

## 2023-01-25 DIAGNOSIS — I10 Essential (primary) hypertension: Secondary | ICD-10-CM | POA: Diagnosis not present

## 2023-01-25 DIAGNOSIS — E039 Hypothyroidism, unspecified: Secondary | ICD-10-CM | POA: Diagnosis not present

## 2023-01-25 DIAGNOSIS — G8929 Other chronic pain: Secondary | ICD-10-CM | POA: Diagnosis not present

## 2023-01-25 DIAGNOSIS — I110 Hypertensive heart disease with heart failure: Secondary | ICD-10-CM | POA: Diagnosis not present

## 2023-01-25 DIAGNOSIS — G63 Polyneuropathy in diseases classified elsewhere: Secondary | ICD-10-CM | POA: Diagnosis not present

## 2023-01-25 MED ORDER — LEVOTHYROXINE SODIUM 75 MCG PO TABS: 75.0000 ug | ORAL_TABLET | Freq: Every day | ORAL | 1 refills | Status: DC

## 2023-02-01 ENCOUNTER — Encounter: Payer: Self-pay | Admitting: Physical Therapy

## 2023-02-01 ENCOUNTER — Ambulatory Visit: Payer: PPO | Admitting: Physical Therapy

## 2023-02-01 DIAGNOSIS — M546 Pain in thoracic spine: Secondary | ICD-10-CM

## 2023-02-01 DIAGNOSIS — R29898 Other symptoms and signs involving the musculoskeletal system: Secondary | ICD-10-CM

## 2023-02-01 DIAGNOSIS — M5412 Radiculopathy, cervical region: Secondary | ICD-10-CM | POA: Diagnosis not present

## 2023-02-01 NOTE — Therapy (Signed)
OUTPATIENT PHYSICAL THERAPY TREATMENT   Patient Name: Michael [name] MRN: [id number] DOB:[birth year], 72 y.o., male Today's Date: 02/01/2023  END OF SESSION:  PT End of Session - 02/01/23 0843     Visit Number 2    Date for PT Re-Evaluation 03/21/23    Progress Note Due on Visit 10    PT Start Time 0845    PT Stop Time 0930    PT Time Calculation (min) 45 min    Activity Tolerance Patient tolerated treatment well    Behavior During Therapy [facility] for tasks assessed/performed             Past Medical History:  Diagnosis Date   Allergy    Arthritis    Asthma    Blood transfusion without reported diagnosis    "as a child"   Cancer (HCC)    basal cell and squamous cell carcinoma   Cancer (HCC)    prostate   Cataract 2023   Clotting disorder (HCC)    Colon polyps    COPD (chronic obstructive pulmonary disease) (HCC) 1975   Difficult airway for intubation 09/28/2022   2015 at [facility]   GERD (gastroesophageal reflux disease)    Gout    High cholesterol    History of hepatitis    due to drug interaction?   History of pancreatitis 2016   ?due to colchicine   History of pulmonary embolus (PE) 2016   History of stomach ulcers    Hypertension    Hypothyroidism    Neuromuscular disorder (HCC)    restless leg   OSA (obstructive sleep apnea) 2015   has CPAP   Osteopenia    Pneumonia    Seizures (HCC)    x1 10 years ago   Sleep apnea    Thyroid disease    hypothyroid   Ulcer 1985   Urinary incontinence    Past Surgical History:  Procedure Laterality Date   BASAL CELL CARCINOMA EXCISION  2017   MOHS.  beneath right eye   BRAIN SURGERY  1967   Fractured skull in an accident   CHOLECYSTECTOMY  2001   COLON SURGERY  2001   Complication of gall bladder surgery   COLONOSCOPY     multiple - last 06/2017   COLONOSCOPY WITH PROPOFOL N/A 01/17/2023   Procedure: COLONOSCOPY WITH PROPOFOL;  Surgeon: Iva Boop, MD;  Location: [facility];  Service:  Gastroenterology;  Laterality: N/A;   COSMETIC SURGERY  2017   Basal cell cancer - MOHS surgery   HERNIA REPAIR  2001   HIP SURGERY     JOINT REPLACEMENT  06/2016   Total hip replacement - left hip   PENILE PROSTHESIS IMPLANT  2016   POLYPECTOMY  01/17/2023   Procedure: POLYPECTOMY;  Surgeon: Iva Boop, MD;  Location: [facility];  Service: Gastroenterology;;   PROSTATECTOMY  2009   SINUS SURGERY WITH INSTATRAK  2014   SQUAMOUS CELL CARCINOMA EXCISION  01/2017   nose   TOOTH EXTRACTION     TOTAL HIP ARTHROPLASTY  06/2016   Patient Active Problem List   Diagnosis Date Noted   Skin lesion 01/24/2023   Benign neoplasm of transverse colon 01/17/2023   History of colonic polyps 01/17/2023   Chronic scapular pain 12/23/2022   Difficult airway for intubation 09/28/2022   RLS (restless legs syndrome) 07/14/2021   Bradycardia 02/12/2021   Tremor of right hand 10/21/2020   Seasonal allergic conjunctivitis 06/16/2020   Gastroesophageal reflux disease 02/16/2020  Aspirin-exacerbated respiratory disease (AERD) 04/12/2019   Adverse food reaction 04/12/2019   Left knee pain 08/22/2017   Acute sinusitis 06/08/2017   Hypothyroid 03/24/2017   Hyperlipidemia 03/24/2017   Gout 03/24/2017   Squamous cell carcinoma 03/24/2017   History of pulmonary embolism 03/24/2017   History of prostate cancer 03/24/2017   OSA (obstructive sleep apnea) 03/24/2017   Hypertension 03/24/2017   Severe persistent asthma 02/09/2017   Other allergic rhinitis 02/09/2017   Recurrent sinusitis 02/09/2017   History of nasal polyp 02/09/2017   Allergy to multiple antibiotics 02/09/2017    PCP: Val EagleDaryel Gerald, MD  REFERRING PROVIDER: Reino Bellis, DO  REFERRING DIAG: L cervical radiculopathy  THERAPY DIAG:  Radiculopathy, cervical region  Pain in thoracic spine  Weakness of left shoulder  Rationale for Evaluation and Treatment: Rehabilitation  ONSET DATE: x 5 years, progressive over last  6 months  SUBJECTIVE:                                                                                                                                                                                                         SUBJECTIVE STATEMENT: Theracane is really helping, can get to the spot.  Overall doing better.  Feels exercises are helping as well. This morning woke up 5-6, after hot shower and moving around better, around a 3.   Hand dominance: Right  PERTINENT HISTORY:  Progressive pain L shoulder blade, worsened last 6 months, therefore sought help from MD,referred to PT for probable facet jt arthropathy and radiculopathy  PAIN:  Are you having pain? Yes: NPRS scale: 3 to 6/10 Pain location: L medial scapular border Pain description: constant Aggravating factors: recliner for reading, lying down, bending head forward Relieving factors: muscle relaxers at times  PRECAUTIONS: None  RED FLAGS: None     WEIGHT BEARING RESTRICTIONS: No  FALLS:  Has patient fallen in last 6 months? 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  DIAGNOSTIC FINDINGS:  Na   PATIENT SURVEYS:  NDI 15/50  COGNITION: Overall cognitive status: Within functional limits for tasks assessed  SENSATION: Wfl with test today but reports intermittent numbness B hands with prolonged reading  POSTURE: rounded shoulders, increased thoracic kyphosis, and depressed L shoulder  PALPATION: Non tender over l medial  scapular border, rotator cuff musculature.  Very tender with light palpation along C 7 to T 6 spinous processes, and with PA segmental testing, more tender C7, T1, T2 levels. Very  tender with replication of familiar pain L 1st rib/upper traps, and L levator scapulae   CERVICAL ROM:   Active ROM A/PROM (deg) eval  Flexion 60 p!  Extension 60 p!  Right lateral flexion nt  Left lateral flexion nt  Right rotation 70 p!  Left rotation wnl   THORACIC SPINE: flex, pain, 80%, ext wnl, rotation 80% with pain terminal ranges UPPER EXTREMITY ROM: All B shoulder AROM WNL UPPER EXTREMITY MMT: Noted poor control of L GH jt with seated rows ex with theraband  MMT Right eval Left eval  Shoulder flexion  4+  Shoulder extension    Shoulder abduction    Shoulder adduction    Shoulder extension    Shoulder internal rotation    Shoulder external rotation    Middle trapezius  4-  Lower trapezius  4-  Elbow flexion  4+  Elbow extension    Wrist flexion    Wrist extension    Wrist ulnar deviation    Wrist radial deviation    Wrist pronation    Wrist supination    Grip strength     (Blank rows =wnl)  CERVICAL SPECIAL TESTS:  Cranial cervical flexion test: Negative, Spurling's test: Positive, and Distraction test: Negative  TODAY'S TREATMENT:                                                                                                                              DATE:   02/01/23 Therapeutic Exercise: to improve strength and mobility.  Demo, verbal and tactile cues throughout for technique. UBE L1 x 6 min (31f/3b) On pool noodle Pec stretch  Back stroke x 10 each - cues to go back to tightness Open book stretch - cues to go back to tightness S/L open book stretch - L side - leg cramp Open book stretch wall - better tolerated  Manual Therapy: to decrease muscle spasm and pain and improve mobility STM/TPR to L subscap, rhomboids, UT, Levator scapula, skilled palpation and monitoring during dry needling. Trigger Point Dry-Needling  Treatment instructions: Expect mild to moderate muscle soreness. S/S of pneumothorax if dry needled over a lung field, and to seek  immediate medical attention should they occur. Patient verbalized understanding of these instructions and education. Patient Consent Given: Yes Education handout provided: Yes Muscles treated: subscap, rhomboids, UT, Levator scapula Electrical stimulation performed: No Parameters: N/A Treatment response/outcome: Twitch Response Elicited and Palpable Increase in Muscle Length   01/24/23: Evaluation:  Therex:  inst in various therex to improve mechanics and reduce compressive forces lower cervical spine:  Supine for pec stretch over 1/2 thoracic foam roller, combined with pec flys and B shoulder flexion Seated for SNAG's L T 1/2, combined with neck flexion Seated rows, hands vertical with green t band,  advised to perform in small increments during day to assist with proprioception/muscle memory    PATIENT EDUCATION:  Education details: HEP, TrDN Person educated: Patient Education method: Explanation, Demonstration, Verbal cues, and Handouts Education comprehension: verbalized understanding, returned demonstration, verbal cues required, and needs further education  HOME EXERCISE PROGRAM: Access Code: [email or web address] URL: [email or web address] Date: 02/01/2023 Prepared by: Harrie Foreman  Exercises - Supine Chest Stretch on Foam Roll  - 1 x daily - 7 x weekly - 2 sets - 10 reps - Thoracic Foam Roll Mobilization Backstroke  - 1 x daily - 7 x weekly - 2 sets - 10 reps - Open Book Chest Stretch on Towel Roll  - 1 x daily - 7 x weekly - 2 sets - 10 reps - Standing Thoracic Open Book at Wall  - 1 x daily - 7 x weekly - 1 sets - 10 reps  ASSESSMENT:  CLINICAL IMPRESSION: Michael [name] reports continued pain especially L scapular border. Today focused on thoracic mobilization exercises, tolerated well but reporting tightness in L shoulder with exercises, cued to perform in pain free ROM.  Noted trigger points throughout L periscapular musculature, so after explanation of  DN rational, procedures, outcomes and potential side effects, patient verbalized consent to DN treatment in conjunction with manual STM/DTM and TPR to reduce ttp/muscle tension. Muscles treated as indicated above. DN produced normal response with good twitches elicited resulting in palpable reduction in pain/ttp and muscle tension, with patient noting less pain upon initiation of movement following DN. Pt educated to expect mild to moderate muscle soreness for up to 24-48 hrs and instructed to continue prescribed home exercise program and current activity level with pt verbalizing understanding of theses instructions.   Michael [name] continues to demonstrate potential for improvement and would benefit from continued skilled therapy to address impairments.   Given updated HEP, can use pool noodle in place of foam roller.      OBJECTIVE IMPAIRMENTS: decreased ROM, decreased strength, hypomobility, increased fascial restrictions, impaired flexibility, impaired sensation, impaired UE functional use, postural dysfunction, and pain.   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   REHAB POTENTIAL: Good  CLINICAL DECISION MAKING: Evolving/moderate complexity  EVALUATION COMPLEXITY: Moderate   GOALS: Goals reviewed with patient? Yes  SHORT TERM GOALS: Target date: 2 weeks, 02/07/23  I HEP Baseline:  Goal status: IN PROGRESS 02/01/23- reviewed and updated   LONG TERM GOALS: Target date: 8 weeks, 03/21/23  NDI improve to 8/50 from 15/50 Baseline:  Goal status: IN PROGRESS  2.  Pain free full cervical spine flexion  Baseline: 70% with heightened pain L medial scapula Goal status: IN  PROGRESS  3.  Increase strength L middle and lower traps to 5/5, symmetrical with R, for improved B UE stability for reaching forward Baseline: 4- Goal status: IN PROGRESS  4.  Improve sleep patterns , patient have no sleep interruption consistently Baseline: wakes frequently Goal status: IN PROGRESS     PLAN:  PT FREQUENCY: 1x/week  PT DURATION: 8 weeks  PLANNED INTERVENTIONS: Therapeutic exercises, Therapeutic activity, Neuromuscular re-education, Balance training, Gait training, Patient/Family education, Self Care, and Joint mobilization  PLAN FOR NEXT SESSION: TPDN L upper traps, levator, lower cervical and upper thoracic paraspinals, progress with strengthening middle,  lower traps, progress with NAG's and SNAGs, jt mobs upper and middle thoracic   Jena Gauss, PT, DPT 02/01/2023, 9:38 AM

## 2023-02-01 NOTE — Patient Instructions (Signed)

## 2023-02-05 ENCOUNTER — Other Ambulatory Visit (HOSPITAL_BASED_OUTPATIENT_CLINIC_OR_DEPARTMENT_OTHER): Payer: Self-pay | Admitting: Pulmonary Disease

## 2023-02-07 ENCOUNTER — Ambulatory Visit (HOSPITAL_BASED_OUTPATIENT_CLINIC_OR_DEPARTMENT_OTHER)
Admission: RE | Admit: 2023-02-07 | Discharge: 2023-02-07 | Disposition: A | Discharge status: Home / Self Care | Payer: PPO | Source: Ambulatory Visit | Attending: Family | Admitting: Family

## 2023-02-07 DIAGNOSIS — J9811 Atelectasis: Secondary | ICD-10-CM | POA: Diagnosis not present

## 2023-02-07 DIAGNOSIS — R911 Solitary pulmonary nodule: Secondary | ICD-10-CM

## 2023-02-08 ENCOUNTER — Encounter: Payer: Self-pay | Admitting: Sports Medicine

## 2023-02-08 ENCOUNTER — Ambulatory Visit (HOSPITAL_BASED_OUTPATIENT_CLINIC_OR_DEPARTMENT_OTHER)
Admission: RE | Admit: 2023-02-08 | Discharge: 2023-02-08 | Disposition: A | Discharge status: Home / Self Care | Payer: PPO | Source: Ambulatory Visit | Attending: Sports Medicine | Admitting: Sports Medicine

## 2023-02-08 ENCOUNTER — Ambulatory Visit: Payer: PPO | Admitting: Sports Medicine

## 2023-02-08 VITALS — BP 130/78 | Ht 66.0 in | Wt 236.0 lb

## 2023-02-08 DIAGNOSIS — M50322 Other cervical disc degeneration at C5-C6 level: Secondary | ICD-10-CM | POA: Diagnosis not present

## 2023-02-08 DIAGNOSIS — M5030 Other cervical disc degeneration, unspecified cervical region: Secondary | ICD-10-CM

## 2023-02-08 DIAGNOSIS — M47812 Spondylosis without myelopathy or radiculopathy, cervical region: Secondary | ICD-10-CM | POA: Diagnosis not present

## 2023-02-08 NOTE — Progress Notes (Signed)
Subjective:    Patient ID: Michael [name], male    DOB: 25-Jan-1951, 72 y.o.   MRN: [id number]  HPI  Michael [name] presents today for follow-up.  He has had 2 physical therapy visits.  Biggest benefit thus far has been dry needling.  He exercises and actually made his pain somewhat worse.  He took a muscle relaxer the other night for some low back pain but it did not help his neck.  He has a TENS unit which he uses occasionally and it is somewhat helpful.  Pain is now radiating into the left shoulder but not past the elbow.  Review of Systems As above    Objective:   Physical Exam  Cervical spine: Good range of motion.  No spasm.  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  He will continue with physical therapy as well.  I will call him with x-ray results when available.  If his improvement in physical therapy plateaus then we will discuss merits of cervical spine MRI in anticipation of either facet injections or ESI's.  This note was dictated using Dragon naturally speaking software and may contain errors in syntax, spelling, or content which have not been identified prior to signing this note.

## 2023-02-09 ENCOUNTER — Other Ambulatory Visit: Payer: Self-pay

## 2023-02-09 ENCOUNTER — Ambulatory Visit: Payer: PPO

## 2023-02-09 DIAGNOSIS — R29898 Other symptoms and signs involving the musculoskeletal system: Secondary | ICD-10-CM

## 2023-02-09 DIAGNOSIS — M5412 Radiculopathy, cervical region: Secondary | ICD-10-CM

## 2023-02-09 DIAGNOSIS — M546 Pain in thoracic spine: Secondary | ICD-10-CM

## 2023-02-09 NOTE — Therapy (Signed)
**Note Michael-Identified via Obfuscation** OUTPATIENT PHYSICAL THERAPY TREATMENT   Patient Name: Michael [name] MRN: [id number] DOB:September 30, 1950, 72 y.o., male Today's Date: 02/09/2023  END OF SESSION:  PT End of Session - 02/09/23 0907     Visit Number 3    Date for PT Re-Evaluation 03/21/23    Progress Note Due on Visit 10    PT Start Time 0850    PT Stop Time 0930    PT Time Calculation (min) 40 min    Activity Tolerance Patient tolerated treatment well    Behavior During Therapy [facility] for tasks assessed/performed              Past Medical History:  Diagnosis Date   Allergy    Arthritis    Asthma    Blood transfusion without reported diagnosis    "as a child"   Cancer (HCC)    basal cell and squamous cell carcinoma   Cancer (HCC)    prostate   Cataract 2023   Clotting disorder (HCC)    Colon polyps    COPD (chronic obstructive pulmonary disease) (HCC) 1975   Difficult airway for intubation 09/28/2022   2015 at [facility]   GERD (gastroesophageal reflux disease)    Gout    High cholesterol    History of hepatitis    due to drug interaction?   History of pancreatitis 2016   ?due to colchicine   History of pulmonary embolus (PE) 2016   History of stomach ulcers    Hypertension    Hypothyroidism    Neuromuscular disorder (HCC)    restless leg   OSA (obstructive sleep apnea) 2015   has CPAP   Osteopenia    Pneumonia    Seizures (HCC)    x1 10 years ago   Sleep apnea    Thyroid disease    hypothyroid   Ulcer 1985   Urinary incontinence    Past Surgical History:  Procedure Laterality Date   BASAL CELL CARCINOMA EXCISION  2017   MOHS.  beneath right eye   BRAIN SURGERY  1967   Fractured skull in an accident   CHOLECYSTECTOMY  2001   COLON SURGERY  2001   Complication of gall bladder surgery   COLONOSCOPY     multiple - last 06/2017   COLONOSCOPY WITH PROPOFOL N/A 01/17/2023   Procedure: COLONOSCOPY WITH PROPOFOL;  Surgeon: Iva Boop, MD;  Location: [facility];  Service:  Gastroenterology;  Laterality: N/A;   COSMETIC SURGERY  2017   Basal cell cancer - MOHS surgery   HERNIA REPAIR  2001   HIP SURGERY     JOINT REPLACEMENT  06/2016   Total hip replacement - left hip   PENILE PROSTHESIS IMPLANT  2016   POLYPECTOMY  01/17/2023   Procedure: POLYPECTOMY;  Surgeon: Iva Boop, MD;  Location: [facility];  Service: Gastroenterology;;   PROSTATECTOMY  2009   SINUS SURGERY WITH INSTATRAK  2014   SQUAMOUS CELL CARCINOMA EXCISION  01/2017   nose   TOOTH EXTRACTION     TOTAL HIP ARTHROPLASTY  06/2016   Patient Active Problem List   Diagnosis Date Noted   Skin lesion 01/24/2023   Benign neoplasm of transverse colon 01/17/2023   History of colonic polyps 01/17/2023   Chronic scapular pain 12/23/2022   Difficult airway for intubation 09/28/2022   RLS (restless legs syndrome) 07/14/2021   Bradycardia 02/12/2021   Tremor of right hand 10/21/2020   Seasonal allergic conjunctivitis 06/16/2020   Gastroesophageal reflux disease 02/16/2020  Aspirin-exacerbated respiratory disease (AERD) 04/12/2019   Adverse food reaction 04/12/2019   Left knee pain 08/22/2017   Acute sinusitis 06/08/2017   Hypothyroid 03/24/2017   Hyperlipidemia 03/24/2017   Gout 03/24/2017   Squamous cell carcinoma 03/24/2017   History of pulmonary embolism 03/24/2017   History of prostate cancer 03/24/2017   OSA (obstructive sleep apnea) 03/24/2017   Hypertension 03/24/2017   Severe persistent asthma 02/09/2017   Other allergic rhinitis 02/09/2017   Recurrent sinusitis 02/09/2017   History of nasal polyp 02/09/2017   Allergy to multiple antibiotics 02/09/2017    PCP: Val EagleDaryel Gerald, MD  REFERRING PROVIDER: Reino Bellis, DO  REFERRING DIAG: L cervical radiculopathy  THERAPY DIAG:  Radiculopathy, cervical region  Pain in thoracic spine  Weakness of left shoulder  Rationale for Evaluation and Treatment: Rehabilitation  ONSET DATE: x 5 years, progressive over last  6 months  SUBJECTIVE:                                                                                                                                                                                                         SUBJECTIVE STATEMENT: The dry needling made me feel really good for about 2 days.  Had  CT scan and x ray, holding my L arm up for the x ray really made me hurt, slept poorly last night because in so much pain.    Hand dominance: Right  PERTINENT HISTORY:  Progressive pain L shoulder blade, worsened last 6 months, therefore sought help from MD,referred to PT for probable facet jt arthropathy and radiculopathy  PAIN:  Are you having pain? Yes: NPRS scale: 3 to 6/10 Pain location: L medial scapular border Pain description: constant Aggravating factors: recliner for reading, lying down, bending head forward Relieving factors: muscle relaxers at times  PRECAUTIONS: None  RED FLAGS: None     WEIGHT BEARING RESTRICTIONS: No  FALLS:  Has patient fallen in last 6 months? 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  DIAGNOSTIC FINDINGS:  Na   PATIENT SURVEYS:  NDI 15/50  COGNITION: Overall cognitive status: Within functional limits for tasks assessed  SENSATION: Wfl with test today but reports intermittent numbness B hands with prolonged reading  POSTURE: rounded shoulders, increased thoracic kyphosis, and depressed L  shoulder  PALPATION: Non tender over l medial scapular border, rotator cuff musculature.  Very tender with light palpation along C 7 to T 6 spinous processes, and with PA segmental testing, more tender C7, T1, T2 levels. Very tender  with replication of familiar pain L 1st rib/upper traps, and L levator scapulae   CERVICAL ROM:   Active ROM A/PROM (deg) eval  Flexion 60 p!  Extension 60 p!  Right lateral flexion nt  Left lateral flexion nt  Right rotation 70 p!  Left rotation wnl   THORACIC SPINE: flex, pain, 80%, ext wnl, rotation 80% with pain terminal ranges UPPER EXTREMITY ROM: All B shoulder AROM WNL UPPER EXTREMITY MMT: Noted poor control of L GH jt with seated rows ex with theraband  MMT Right eval Left eval  Shoulder flexion  4+  Shoulder extension    Shoulder abduction    Shoulder adduction    Shoulder extension    Shoulder internal rotation    Shoulder external rotation    Middle trapezius  4-  Lower trapezius  4-  Elbow flexion  4+  Elbow extension    Wrist flexion    Wrist extension    Wrist ulnar deviation    Wrist radial deviation    Wrist pronation    Wrist supination    Grip strength     (Blank rows =wnl)  CERVICAL SPECIAL TESTS:  Cranial cervical flexion test: Negative, Spurling's test: Positive, and Distraction test: Negative  TODAY'S TREATMENT:                                                                                                                              DATE:  02/09/23: Cervical traction: utilized the static cervical traction unit for 10 min, at 16 #, to provide decompression of cervical spine, and nerve roots.    Manual Therapy: to decrease muscle spasm and pain and improve mobility - performed by Jena Gauss, PT  STM/TPR to L UT, levator scapulae, rhomboids, skilled palpation and monitoring during dry needling. Trigger Point Dry-Needling  Treatment instructions: Expect mild to moderate muscle soreness. S/S of pneumothorax if dry needled over a lung field, and to seek immediate medical attention should they occur. Patient verbalized understanding of these instructions and education. Patient Consent Given: Yes Education handout provided: Previously  provided Muscles treated: L UT, L levator scapulae Electrical stimulation performed: No Parameters: N/A Treatment response/outcome: Twitch Response Elicited and Palpable Increase in Muscle Length Did not add more ex and advised pt to stop current therex as per his description the foam roller and arm movements are aggravating his radicular Sx L  02/01/23 Therapeutic Exercise: to improve strength and mobility.  Demo, verbal and tactile cues throughout for technique. UBE L1 x 6 min (3f/3b) On pool noodle Pec stretch  Back stroke x 10 each - cues to go back to tightness Open book stretch - cues to go back to tightness S/L open book stretch -  L side - leg cramp Open book stretch wall - better tolerated  Manual Therapy: to decrease muscle spasm and pain and improve mobility STM/TPR to L subscap, rhomboids, UT, Levator scapula, skilled palpation and monitoring during dry needling. Trigger Point Dry-Needling  Treatment instructions: Expect mild to moderate muscle soreness. S/S of pneumothorax if dry needled over a lung field, and to seek immediate medical attention should they occur. Patient verbalized understanding of these instructions and education. Patient Consent Given: Yes Education handout provided: Yes Muscles treated: subscap, rhomboids, UT, Levator scapula Electrical stimulation performed: No Parameters: N/A Treatment response/outcome: Twitch Response Elicited and Palpable Increase in Muscle Length   01/24/23: Evaluation:  Therex:  inst in various therex to improve mechanics and reduce compressive forces lower cervical spine:  Supine for pec stretch over 1/2 thoracic foam roller, combined with pec flys and B shoulder flexion Seated for SNAG's L T 1/2, combined with neck flexion Seated rows, hands vertical with green t band, advised to perform in small increments during day to assist with proprioception/muscle memory    PATIENT EDUCATION:  Education details: continue HEP as  tolerated Person educated: Patient Education method: Medical illustrator Education comprehension: verbalized understanding  HOME EXERCISE PROGRAM: Access Code: [email or web address] URL: [email or web address] Date: 02/01/2023 Prepared by: Harrie Foreman  Exercises - Supine Chest Stretch on Foam Roll  - 1 x daily - 7 x weekly - 2 sets - 10 reps - Thoracic Foam Roll Mobilization Backstroke  - 1 x daily - 7 x weekly - 2 sets - 10 reps - Open Book Chest Stretch on Towel Roll  - 1 x daily - 7 x weekly - 2 sets - 10 reps - Standing Thoracic Open Book at Wall  - 1 x daily - 7 x weekly - 1 sets - 10 reps  ASSESSMENT:  CLINICAL IMPRESSION: Michael [name] reports increased spasm and pain following prolonged positioning with L arm overhead for CT.  Changed treatment approach today with trial of cervical traction, to reduce compressive forces cervical spine.   After traction, performed manual therapy and TrDN to L UT and Levator scapulae with immediate significant reduction in both pain and spasm.   Michael [name] continues to demonstrate potential for improvement and would benefit from continued skilled therapy to address impairments.        OBJECTIVE IMPAIRMENTS: decreased ROM, decreased strength, hypomobility, increased fascial restrictions, impaired flexibility, impaired sensation, impaired UE functional use, postural dysfunction, and pain.   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   REHAB POTENTIAL: Good  CLINICAL DECISION MAKING: Evolving/moderate complexity  EVALUATION COMPLEXITY: Moderate   GOALS: Goals reviewed with patient? Yes  SHORT TERM  GOALS: Target date: 2 weeks, 02/07/23  I HEP Baseline:  Goal status: IN PROGRESS 02/01/23- reviewed and updated   LONG TERM GOALS: Target date: 8 weeks, 03/21/23  NDI improve to 8/50 from 15/50 Baseline:  Goal status: IN PROGRESS  2.  Pain free full cervical spine flexion  Baseline: 70% with heightened pain L medial scapula Goal status: IN PROGRESS  3.  Increase strength L middle and lower traps to 5/5, symmetrical with R, for improved B UE stability for reaching forward Baseline: 4- Goal status: IN PROGRESS  4.  Improve sleep patterns , patient  have no sleep interruption consistently Baseline: wakes frequently Goal status: IN PROGRESS     PLAN:  PT FREQUENCY: 1x/week  PT DURATION: 8 weeks  PLANNED INTERVENTIONS: Therapeutic exercises, Therapeutic activity, Neuromuscular re-education, Balance training, Gait training, Patient/Family education, Self Care, and Joint mobilization  PLAN FOR NEXT SESSION: TPDN L upper traps, levator, lower cervical and upper thoracic paraspinals, progress with strengthening middle, lower traps, progress with NAG's and SNAGs, jt mobs upper and middle thoracic. Re evaluate home program   Early Chars, PT, DPT, OCS 02/09/2023, 4:48 PM  Jena Gauss, PT, DPT 02/09/2023 11:49 AM

## 2023-02-12 ENCOUNTER — Encounter: Payer: Self-pay | Admitting: Family

## 2023-02-14 ENCOUNTER — Encounter: Payer: Self-pay | Admitting: Family

## 2023-02-14 MED ORDER — ALBUTEROL SULFATE HFA 108 (90 BASE) MCG/ACT IN AERS: 2.0000 | INHALATION_SPRAY | Freq: Four times a day (QID) | RESPIRATORY_TRACT | 5 refills | Status: DC | PRN

## 2023-02-14 NOTE — Telephone Encounter (Signed)
  I spoke with Michael [name] on the phone after reviewing updated x-rays of his cervical spine.  These x-rays are compared to cervical spine x-rays from 2021.  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  In addition, he has multilevel facet arthropathy which appears to be moderate.  Fortunately, he continues to notice improvement with physical therapy.  As long as that continues to be the case then we do not need to pursue further workup or treatment.  However, if his improvement plateaus, then I would consider a referral to PM&R to discuss cervical spine injections.  He will follow-up with me as needed.  This note was dictated using Dragon naturally speaking software and may contain errors in syntax, spelling, or content which have not been identified prior to signing this note.

## 2023-02-16 ENCOUNTER — Ambulatory Visit: Payer: PPO

## 2023-02-16 ENCOUNTER — Other Ambulatory Visit: Payer: Self-pay

## 2023-02-16 DIAGNOSIS — M5412 Radiculopathy, cervical region: Secondary | ICD-10-CM

## 2023-02-16 DIAGNOSIS — M546 Pain in thoracic spine: Secondary | ICD-10-CM

## 2023-02-16 DIAGNOSIS — R29898 Other symptoms and signs involving the musculoskeletal system: Secondary | ICD-10-CM

## 2023-02-16 NOTE — Therapy (Signed)
OUTPATIENT PHYSICAL THERAPY TREATMENT   Patient Name: Michael [name] MRN: [id number] DOB:[birth year], 72 y.o., male Today's Date: 02/16/2023  END OF SESSION:  PT End of Session - 02/16/23 0850     Visit Number 4    Date for PT Re-Evaluation 03/21/23    Progress Note Due on Visit 10    PT Start Time [phone]    PT Stop Time 0930    PT Time Calculation (min) 44 min    Activity Tolerance Patient tolerated treatment well    Behavior During Therapy [facility] for tasks assessed/performed               Past Medical History:  Diagnosis Date   Allergy    Arthritis    Asthma    Blood transfusion without reported diagnosis    "as a child"   Cancer (HCC)    basal cell and squamous cell carcinoma   Cancer (HCC)    prostate   Cataract 2023   Clotting disorder (HCC)    Colon polyps    COPD (chronic obstructive pulmonary disease) (HCC) 1975   Difficult airway for intubation 09/28/2022   2015 at [facility]   GERD (gastroesophageal reflux disease)    Gout    High cholesterol    History of hepatitis    due to drug interaction?   History of pancreatitis 2016   ?due to colchicine   History of pulmonary embolus (PE) 2016   History of stomach ulcers    Hypertension    Hypothyroidism    Lung nodule    Neuromuscular disorder (HCC)    restless leg   OSA (obstructive sleep apnea) 2015   has CPAP   Osteopenia    Pneumonia    Seizures (HCC)    x1 10 years ago   Sleep apnea    Thyroid disease    hypothyroid   Ulcer 1985   Urinary incontinence    Past Surgical History:  Procedure Laterality Date   BASAL CELL CARCINOMA EXCISION  2017   MOHS.  beneath right eye   BRAIN SURGERY  1967   Fractured skull in an accident   CHOLECYSTECTOMY  2001   COLON SURGERY  2001   Complication of gall bladder surgery   COLONOSCOPY     multiple - last 06/2017   COLONOSCOPY WITH PROPOFOL N/A 01/17/2023   Procedure: COLONOSCOPY WITH PROPOFOL;  Surgeon: Iva Boop, MD;  Location: [facility];  Service: Gastroenterology;  Laterality: N/A;   COSMETIC SURGERY  2017   Basal cell cancer - MOHS surgery   HERNIA REPAIR  2001   HIP SURGERY     JOINT REPLACEMENT  06/2016   Total hip replacement - left hip   PENILE PROSTHESIS IMPLANT  2016   POLYPECTOMY  01/17/2023   Procedure: POLYPECTOMY;  Surgeon: Iva Boop, MD;  Location: [facility];  Service: Gastroenterology;;   PROSTATECTOMY  2009   SINUS SURGERY WITH INSTATRAK  2014   SQUAMOUS CELL CARCINOMA EXCISION  01/2017   nose   TOOTH EXTRACTION     TOTAL HIP ARTHROPLASTY  06/2016   Patient Active Problem List   Diagnosis Date Noted   Skin lesion 01/24/2023   Benign neoplasm of transverse colon 01/17/2023   History of colonic polyps 01/17/2023   Chronic scapular pain 12/23/2022   Difficult airway for intubation 09/28/2022   RLS (restless legs syndrome) 07/14/2021   Bradycardia 02/12/2021   Tremor of right hand 10/21/2020   Seasonal allergic conjunctivitis 06/16/2020  Gastroesophageal reflux disease 02/16/2020   Aspirin-exacerbated respiratory disease (AERD) 04/12/2019   Adverse food reaction 04/12/2019   Left knee pain 08/22/2017   Acute sinusitis 06/08/2017   Hypothyroid 03/24/2017   Hyperlipidemia 03/24/2017   Gout 03/24/2017   Squamous cell carcinoma 03/24/2017   History of pulmonary embolism 03/24/2017   History of prostate cancer 03/24/2017   OSA (obstructive sleep apnea) 03/24/2017   Hypertension 03/24/2017   Severe persistent asthma 02/09/2017   Other allergic rhinitis 02/09/2017   Recurrent sinusitis 02/09/2017   History of nasal polyp 02/09/2017   Allergy to multiple antibiotics 02/09/2017    PCP: Val EagleDaryel Gerald, MD  REFERRING PROVIDER: Reino Bellis, DO  REFERRING DIAG: L cervical radiculopathy  THERAPY DIAG:  Radiculopathy, cervical region  Pain in thoracic spine  Weakness of left shoulder  Rationale for Evaluation and Treatment: Rehabilitation  ONSET DATE: x 5 years,  progressive over last 6 months  SUBJECTIVE:                                                                                                                                                                                                         SUBJECTIVE STATEMENT: feel much better today.  Heard from MD regarding x rays c spine and may be referred to pain management clinic but feels much better now, also taking steroids due to asthma uptick , so steroids make me feel better all over Hand dominance: Right  PERTINENT HISTORY:  Progressive pain L shoulder blade, worsened last 6 months, therefore sought help from MD,referred to PT for probable facet jt arthropathy and radiculopathy  PAIN:  Are you having pain? Yes: NPRS scale: 3 to 6/10 Pain location: L medial scapular border Pain description: constant Aggravating factors: recliner for reading, lying down, bending head forward Relieving factors: muscle relaxers at times  PRECAUTIONS: None  RED FLAGS: None     WEIGHT BEARING RESTRICTIONS: No  FALLS:  Has patient fallen in last 6 months? 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  DIAGNOSTIC FINDINGS:  Na   PATIENT SURVEYS:  NDI 15/50  COGNITION: Overall cognitive status: Within functional limits for tasks assessed  SENSATION: Wfl with test today but reports intermittent numbness B hands with prolonged reading  POSTURE: rounded shoulders, increased thoracic kyphosis,  and depressed L shoulder  PALPATION: Non tender over l medial scapular border, rotator cuff musculature.  Very tender with light palpation along C 7 to T 6 spinous processes, and with PA segmental  testing, more tender C7, T1, T2 levels. Very tender with replication of familiar pain L 1st rib/upper traps, and L levator scapulae   CERVICAL ROM:   Active ROM A/PROM (deg) eval  Flexion 60 p!  Extension 60 p!  Right lateral flexion nt  Left lateral flexion nt  Right rotation 70 p!  Left rotation wnl   THORACIC SPINE: flex, pain, 80%, ext wnl, rotation 80% with pain terminal ranges UPPER EXTREMITY ROM: All B shoulder AROM WNL UPPER EXTREMITY MMT: Noted poor control of L GH jt with seated rows ex with theraband  MMT Right eval Left eval  Shoulder flexion  4+  Shoulder extension    Shoulder abduction    Shoulder adduction    Shoulder extension    Shoulder internal rotation    Shoulder external rotation    Middle trapezius  4-  Lower trapezius  4-  Elbow flexion  4+  Elbow extension    Wrist flexion    Wrist extension    Wrist ulnar deviation    Wrist radial deviation    Wrist pronation    Wrist supination    Grip strength     (Blank rows =wnl)  CERVICAL SPECIAL TESTS:  Cranial cervical flexion test: Negative, Spurling's test: Positive, and Distraction test: Negative  TODAY'S TREATMENT:                                                                                                                              DATE:  02/16/23: Therex:  UBE 2 .5 min F, 3 min B at 1.5 level R Side lying:  for open books L UE, utilized 1# wt to pt of fatigue L shoulder, around 30 reps Standing with ball behind head , for light cervical retraction combined with cervical rotation, needed cues to position correctly to avoid going into a forward head position Standing with ball behind head, for "serving tray movement B shoulders, initially B, then switched to alt arms.  Kinesiotaping: utilized 2 I pieces attachments ant delts and medial delts, with 35% pull distal to proximal middle and upper traps. Utilized to relieve tension, assist scapular retractors in relieving load of L UE on post  lower neck 02/09/23: Cervical traction: utilized the static cervical traction unit for 10 min, at 16 #, to provide decompression of cervical spine, and nerve roots.    Manual Therapy: to decrease muscle spasm and pain and improve mobility - performed by Jena Gauss, PT  STM/TPR to L UT, levator scapulae, rhomboids, skilled palpation and monitoring during dry needling. Trigger Point Dry-Needling  Treatment instructions: Expect mild to moderate muscle soreness. S/S of pneumothorax if dry needled over a lung field, and to seek immediate medical attention should they occur. Patient verbalized understanding  of these instructions and education. Patient Consent Given: Yes Education handout provided: Previously provided Muscles treated: L UT, L levator scapulae Electrical stimulation performed: No Parameters: N/A Treatment response/outcome: Twitch Response Elicited and Palpable Increase in Muscle Length Did not add more ex and advised pt to stop current therex as per his description the foam roller and arm movements are aggravating his radicular Sx L  02/01/23 Therapeutic Exercise: to improve strength and mobility.  Demo, verbal and tactile cues throughout for technique. UBE L1 x 6 min (1f/3b) On pool noodle Pec stretch  Back stroke x 10 each - cues to go back to tightness Open book stretch - cues to go back to tightness S/L open book stretch - L side - leg cramp Open book stretch wall - better tolerated  Manual Therapy: to decrease muscle spasm and pain and improve mobility STM/TPR to L subscap, rhomboids, UT, Levator scapula, skilled palpation and monitoring during dry needling. Trigger Point Dry-Needling  Treatment instructions: Expect mild to moderate muscle soreness. S/S of pneumothorax if dry needled over a lung field, and to seek immediate medical attention should they occur. Patient verbalized understanding of these instructions and education. Patient Consent Given: Yes Education  handout provided: Yes Muscles treated: subscap, rhomboids, UT, Levator scapula Electrical stimulation performed: No Parameters: N/A Treatment response/outcome: Twitch Response Elicited and Palpable Increase in Muscle Length   01/24/23: Evaluation:  Therex:  inst in various therex to improve mechanics and reduce compressive forces lower cervical spine:  Supine for pec stretch over 1/2 thoracic foam roller, combined with pec flys and B shoulder flexion Seated for SNAG's L T 1/2, combined with neck flexion Seated rows, hands vertical with green t band, advised to perform in small increments during day to assist with proprioception/muscle memory    PATIENT EDUCATION:  Education details: continue HEP as tolerated Person educated: Patient Education method: Medical illustrator Education comprehension: verbalized understanding  HOME EXERCISE PROGRAM: Access Code: [email or web address] URL: [email or web address] Date: 02/01/2023 Prepared by: Harrie Foreman  Exercises - Supine Chest Stretch on Foam Roll  - 1 x daily - 7 x weekly - 2 sets - 10 reps - Thoracic Foam Roll Mobilization Backstroke  - 1 x daily - 7 x weekly - 2 sets - 10 reps - Open Book Chest Stretch on Towel Roll  - 1 x daily - 7 x weekly - 2 sets - 10 reps - Standing Thoracic Open Book at Wall  - 1 x daily - 7 x weekly - 1 sets - 10 reps  ASSESSMENT:  CLINICAL IMPRESSION: Michael [name] returned today with much improved Sx, possibly due to new short term steroid prescription for his asthma.  Changed treatment approach today to ex to isolate cervical and thoracic rotation as current ex with B UE movements have not been effective and in fact have increased tissue irritation.  He tolerated the change in therex well and reported that his L shoulder, neck felt better , loosened up, at end of session.  Michael [name] continues to demonstrate potential for improvement and would benefit from continued  skilled therapy to address impairments.        OBJECTIVE IMPAIRMENTS: decreased ROM, decreased strength, hypomobility, increased fascial restrictions, impaired flexibility, impaired sensation, impaired UE functional use, postural dysfunction, and pain.   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   REHAB POTENTIAL: Good  CLINICAL DECISION MAKING: Evolving/moderate complexity  EVALUATION COMPLEXITY: Moderate   GOALS: Goals reviewed with patient? Yes  SHORT TERM GOALS: Target date: 2 weeks, 02/07/23  I HEP Baseline:  Goal status: IN PROGRESS 02/01/23- reviewed and updated   LONG TERM GOALS: Target date: 8 weeks, 03/21/23  NDI improve to 8/50 from 15/50 Baseline:  Goal status: IN PROGRESS  2.  Pain free full cervical spine flexion  Baseline: 70% with heightened pain L medial scapula Goal status: IN PROGRESS  3.  Increase strength L middle and lower traps to 5/5, symmetrical with R, for improved B UE stability for reaching forward Baseline: 4- Goal status: IN PROGRESS  4.  Improve sleep patterns , patient have no sleep interruption consistently Baseline: wakes frequently Goal status: IN PROGRESS     PLAN:  PT FREQUENCY: 1x/week  PT DURATION: 8 weeks  PLANNED INTERVENTIONS: Therapeutic exercises, Therapeutic activity, Neuromuscular re-education, Balance training, Gait training, Patient/Family education, Self Care, and Joint mobilization  PLAN FOR NEXT SESSION: TPDN ? How did KT tape work?  Re evaluate home program   Michael [name], PT, DPT, OCS 02/16/2023, 10:46 AM

## 2023-02-22 ENCOUNTER — Encounter: Payer: Self-pay | Admitting: Physical Therapy

## 2023-02-22 ENCOUNTER — Ambulatory Visit: Payer: PPO | Admitting: Physical Therapy

## 2023-02-22 DIAGNOSIS — M546 Pain in thoracic spine: Secondary | ICD-10-CM

## 2023-02-22 DIAGNOSIS — M5412 Radiculopathy, cervical region: Secondary | ICD-10-CM

## 2023-02-22 DIAGNOSIS — R29898 Other symptoms and signs involving the musculoskeletal system: Secondary | ICD-10-CM

## 2023-02-22 NOTE — Therapy (Signed)
OUTPATIENT PHYSICAL THERAPY TREATMENT   Patient Name: Michael [name] MRN: [id number] DOB:[birth year], 72 y.o., male Today's Date: 02/22/2023  END OF SESSION:  PT End of Session - 02/22/23 0848     Visit Number 5    Date for PT Re-Evaluation 03/21/23    Progress Note Due on Visit 10    PT Start Time 0846    PT Stop Time 0929    PT Time Calculation (min) 43 min    Activity Tolerance Patient tolerated treatment well    Behavior During Therapy [facility] for tasks assessed/performed               Past Medical History:  Diagnosis Date   Allergy    Arthritis    Asthma    Blood transfusion without reported diagnosis    "as a child"   Cancer (HCC)    basal cell and squamous cell carcinoma   Cancer (HCC)    prostate   Cataract 2023   Clotting disorder (HCC)    Colon polyps    COPD (chronic obstructive pulmonary disease) (HCC) 1975   Difficult airway for intubation 09/28/2022   2015 at [facility]   GERD (gastroesophageal reflux disease)    Gout    High cholesterol    History of hepatitis    due to drug interaction?   History of pancreatitis 2016   ?due to colchicine   History of pulmonary embolus (PE) 2016   History of stomach ulcers    Hypertension    Hypothyroidism    Lung nodule    Neuromuscular disorder (HCC)    restless leg   OSA (obstructive sleep apnea) 2015   has CPAP   Osteopenia    Pneumonia    Seizures (HCC)    x1 10 years ago   Sleep apnea    Thyroid disease    hypothyroid   Ulcer 1985   Urinary incontinence    Past Surgical History:  Procedure Laterality Date   BASAL CELL CARCINOMA EXCISION  2017   MOHS.  beneath right eye   BRAIN SURGERY  1967   Fractured skull in an accident   CHOLECYSTECTOMY  2001   COLON SURGERY  2001   Complication of gall bladder surgery   COLONOSCOPY     multiple - last 06/2017   COLONOSCOPY WITH PROPOFOL N/A 01/17/2023   Procedure: COLONOSCOPY WITH PROPOFOL;  Surgeon: Iva Boop, MD;  Location: [facility]  END[facility];  Service: Gastroenterology;  Laterality: N/A;   COSMETIC SURGERY  2017   Basal cell cancer - MOHS surgery   HERNIA REPAIR  2001   HIP SURGERY     JOINT REPLACEMENT  06/2016   Total hip replacement - left hip   PENILE PROSTHESIS IMPLANT  2016   POLYPECTOMY  01/17/2023   Procedure: POLYPECTOMY;  Surgeon: Iva Boop, MD;  Location: [facility] ENDOSCOPY;  Service: Gastroenterology;;   PROSTATECTOMY  2009   SINUS SURGERY WITH INSTATRAK  2014   SQUAMOUS CELL CARCINOMA EXCISION  01/2017   nose   TOOTH EXTRACTION     TOTAL HIP ARTHROPLASTY  06/2016   Patient Active Problem List   Diagnosis Date Noted   Skin lesion 01/24/2023   Benign neoplasm of transverse colon 01/17/2023   History of colonic polyps 01/17/2023   Chronic scapular pain 12/23/2022   Difficult airway for intubation 09/28/2022   RLS (restless legs syndrome) 07/14/2021   Bradycardia 02/12/2021   Tremor of right hand 10/21/2020   Seasonal allergic conjunctivitis 06/16/2020  Gastroesophageal reflux disease 02/16/2020   Aspirin-exacerbated respiratory disease (AERD) 04/12/2019   Adverse food reaction 04/12/2019   Left knee pain 08/22/2017   Acute sinusitis 06/08/2017   Hypothyroid 03/24/2017   Hyperlipidemia 03/24/2017   Gout 03/24/2017   Squamous cell carcinoma 03/24/2017   History of pulmonary embolism 03/24/2017   History of prostate cancer 03/24/2017   OSA (obstructive sleep apnea) 03/24/2017   Hypertension 03/24/2017   Severe persistent asthma 02/09/2017   Other allergic rhinitis 02/09/2017   Recurrent sinusitis 02/09/2017   History of nasal polyp 02/09/2017   Allergy to multiple antibiotics 02/09/2017    PCP: Val EagleDaryel Gerald, MD  REFERRING PROVIDER: Reino Bellis, DO  REFERRING DIAG: L cervical radiculopathy  THERAPY DIAG:  Radiculopathy, cervical region  Pain in thoracic spine  Weakness of left shoulder  Rationale for Evaluation and Treatment: Rehabilitation  ONSET DATE: x 5 years,  progressive over last 6 months  SUBJECTIVE:                                                                                                                                                                                                         SUBJECTIVE STATEMENT:  pain has kept him up since 3AM, 7/10 this AM, took tylenol so down to 4/10. Finished prednisone.  Hand dominance: Right  PERTINENT HISTORY:  Progressive pain L shoulder blade, worsened last 6 months, therefore sought help from MD,referred to PT for probable facet jt arthropathy and radiculopathy  PAIN:  Are you having pain? Yes: NPRS scale: 4-7/10 Pain location: L medial scapular border Pain description: constant Aggravating factors: recliner for reading, lying down, bending head forward Relieving factors: muscle relaxers at times  PRECAUTIONS: None  RED FLAGS: None     WEIGHT BEARING RESTRICTIONS: No  FALLS:  Has patient fallen in last 6 months? 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  DIAGNOSTIC FINDINGS:  Na   PATIENT SURVEYS:  NDI 15/50  COGNITION: Overall cognitive status: Within functional limits for tasks assessed  SENSATION: Wfl with test today but reports intermittent numbness B hands with prolonged reading  POSTURE: rounded shoulders, increased thoracic kyphosis, and depressed L shoulder  PALPATION: Non tender over l medial scapular border, rotator cuff musculature.  Very tender with light palpation along C  7 to T 6 spinous processes, and with PA segmental testing, more tender C7, T1, T2 levels. Very tender with replication of familiar pain L 1st rib/upper traps, and L levator  scapulae   CERVICAL ROM:   Active ROM A/PROM (deg) eval  Flexion 60 p!  Extension 60 p!  Right lateral flexion nt  Left lateral flexion nt  Right rotation 70 p!  Left rotation wnl   THORACIC SPINE: flex, pain, 80%, ext wnl, rotation 80% with pain terminal ranges UPPER EXTREMITY ROM: All B shoulder AROM WNL UPPER EXTREMITY MMT: Noted poor control of L GH jt with seated rows ex with theraband  MMT Right eval Left eval  Shoulder flexion  4+  Shoulder extension    Shoulder abduction    Shoulder adduction    Shoulder extension    Shoulder internal rotation    Shoulder external rotation    Middle trapezius  4-  Lower trapezius  4-  Elbow flexion  4+  Elbow extension    Wrist flexion    Wrist extension    Wrist ulnar deviation    Wrist radial deviation    Wrist pronation    Wrist supination    Grip strength     (Blank rows =wnl)  CERVICAL SPECIAL TESTS:  Cranial cervical flexion test: Negative, Spurling's test: Positive, and Distraction test: Negative  TODAY'S TREATMENT:                                                                                                                              DATE:   02/22/23 Therapeutic Exercise: to improve strength and mobility.  Demo, verbal and tactile cues throughout for technique. UBE L1 x 5 min forward Reviewed HEP, keep arms below 90 deg with exercises to avoid irritation Manual Therapy: to decrease muscle spasm and pain and improve mobility STM/TPR to cervical paraspinals, L UT, L L/S, L rhomboids, skilled palpation and monitoring during dry needling. Trigger Point Dry-Needling  Treatment instructions: Expect mild to moderate muscle soreness. S/S of pneumothorax if dry needled over a lung field, and to seek immediate medical attention should they occur. Patient verbalized understanding of these instructions and education. Patient Consent Given: Yes Education handout provided: Previously provided Muscles treated: L UT, L  cervical multifidi C4-6, L L/S, L supraspinatis Electrical stimulation performed: No Parameters: N/A Treatment response/outcome: Twitch Response Elicited and Palpable Increase in Muscle Length  02/16/23: Therex:  UBE 2 .5 min F, 3 min B at 1.5 level R Side lying:  for open books L UE, utilized 1# wt to pt of fatigue L shoulder, around 30 reps Standing with ball behind head , for light cervical retraction combined with cervical rotation, needed cues to position correctly to avoid going into a forward head position Standing with ball behind head, for "serving tray movement B shoulders, initially B, then switched to alt arms.  Kinesiotaping: utilized 2 I pieces attachments ant delts and medial delts, with 35% pull  distal to proximal middle and upper traps. Utilized to relieve tension, assist scapular retractors in relieving load of L UE on post lower neck 02/09/23: Cervical traction: utilized the static cervical traction unit for 10 min, at 16 #, to provide decompression of cervical spine, and nerve roots.    Manual Therapy: to decrease muscle spasm and pain and improve mobility - performed by Jena Gauss, PT  STM/TPR to L UT, levator scapulae, rhomboids, skilled palpation and monitoring during dry needling. Trigger Point Dry-Needling  Treatment instructions: Expect mild to moderate muscle soreness. S/S of pneumothorax if dry needled over a lung field, and to seek immediate medical attention should they occur. Patient verbalized understanding of these instructions and education. Patient Consent Given: Yes Education handout provided: Previously provided Muscles treated: L UT, L levator scapulae Electrical stimulation performed: No Parameters: N/A Treatment response/outcome: Twitch Response Elicited and Palpable Increase in Muscle Length Did not add more ex and advised pt to stop current therex as per his description the foam roller and arm movements are aggravating his radicular Sx  L  02/01/23 Therapeutic Exercise: to improve strength and mobility.  Demo, verbal and tactile cues throughout for technique. UBE L1 x 6 min (105f/3b) On pool noodle Pec stretch  Back stroke x 10 each - cues to go back to tightness Open book stretch - cues to go back to tightness S/L open book stretch - L side - leg cramp Open book stretch wall - better tolerated  Manual Therapy: to decrease muscle spasm and pain and improve mobility STM/TPR to L subscap, rhomboids, UT, Levator scapula, skilled palpation and monitoring during dry needling. Trigger Point Dry-Needling  Treatment instructions: Expect mild to moderate muscle soreness. S/S of pneumothorax if dry needled over a lung field, and to seek immediate medical attention should they occur. Patient verbalized understanding of these instructions and education. Patient Consent Given: Yes Education handout provided: Yes Muscles treated: subscap, rhomboids, UT, Levator scapula Electrical stimulation performed: No Parameters: N/A Treatment response/outcome: Twitch Response Elicited and Palpable Increase in Muscle Length    PATIENT EDUCATION:  Education details: continue HEP as tolerated Person educated: Patient Education method: Medical illustrator Education comprehension: verbalized understanding  HOME EXERCISE PROGRAM: Access Code: [email or web address] URL: [email or web address] Date: 02/01/2023 Prepared by: Harrie Foreman  Exercises - Supine Chest Stretch on Foam Roll  - 1 x daily - 7 x weekly - 2 sets - 10 reps - Thoracic Foam Roll Mobilization Backstroke  - 1 x daily - 7 x weekly - 2 sets - 10 reps - Open Book Chest Stretch on Towel Roll  - 1 x daily - 7 x weekly - 2 sets - 10 reps - Standing Thoracic Open Book at Wall  - 1 x daily - 7 x weekly - 1 sets - 10 reps  ASSESSMENT:  CLINICAL IMPRESSION: Kardin Kravec Rufo finished prednisone, reported did a lot of overhead activities while on prednise, symptoms  and pain are aggravated today.  Discussed HEP, recommended focusing on exercises that relieve pain, and keeping arms lower to avoid aggravating pain.  Focused on manual therapy and TrDN to decrease muscle spasm and pain, reported felt 100% better after interventions. Paulus Betker Holston continues to demonstrate potential for improvement and would benefit from continued skilled therapy to address impairments.        OBJECTIVE IMPAIRMENTS: decreased ROM, decreased strength, hypomobility, increased fascial restrictions, impaired flexibility, impaired sensation, impaired UE functional use, postural dysfunction, and pain.   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   REHAB POTENTIAL: Good  CLINICAL DECISION MAKING: Evolving/moderate complexity  EVALUATION COMPLEXITY: Moderate   GOALS: Goals reviewed with patient? Yes  SHORT TERM GOALS: Target date: 2 weeks, 02/07/23  I HEP Baseline:  Goal status: MET 02/01/23- reviewed and updated   LONG TERM GOALS: Target date: 8 weeks, 03/21/23  NDI improve to 8/50 from 15/50 Baseline:  Goal status: IN PROGRESS  2.  Pain free full cervical spine flexion  Baseline: 70% with heightened pain L medial scapula Goal status: IN PROGRESS  3.  Increase strength L middle and lower traps to 5/5, symmetrical with R, for improved B UE stability for reaching forward Baseline: 4- Goal status: IN PROGRESS  4.  Improve sleep patterns , patient have no sleep interruption consistently Baseline: wakes frequently Goal status: IN PROGRESS 02/22/23- woken by pain 3AM     PLAN:  PT FREQUENCY: 1x/week  PT DURATION: 8 weeks  PLANNED INTERVENTIONS:  Therapeutic exercises, Therapeutic activity, Neuromuscular re-education, Balance training, Gait training, Patient/Family education, Self Care, and Joint mobilization  PLAN FOR NEXT SESSION:   Re evaluate home program   Jena Gauss, PT, DPT 02/22/2023, 9:42 AM

## 2023-03-03 ENCOUNTER — Ambulatory Visit (INDEPENDENT_AMBULATORY_CARE_PROVIDER_SITE_OTHER): Payer: PPO | Admitting: Medical

## 2023-03-03 VITALS — BP 150/89 | HR 65 | Temp 97.8°F | Resp 20 | Ht 66.0 in | Wt 237.0 lb

## 2023-03-03 DIAGNOSIS — J4541 Moderate persistent asthma with (acute) exacerbation: Secondary | ICD-10-CM

## 2023-03-03 MED ORDER — AZITHROMYCIN 250 MG PO TABS: ORAL_TABLET | ORAL | 0 refills | Status: AC

## 2023-03-03 MED ORDER — PREDNISONE 10 MG (21) PO TBPK: ORAL_TABLET | ORAL | 0 refills | Status: DC

## 2023-03-03 NOTE — Progress Notes (Signed)
Subjective:    Patient ID: Michael [name], male    DOB: [birth year], 72 y.o.   MRN: [id number]  HPI  Discussed the use of AI scribe software for clinical note transcription with the patient, who gave verbal consent to proceed.  History of Present Illness   The patient, with a history of asthma, has been experiencing increased wheezing and difficulty breathing, particularly during the night and from mid-afternoon onwards. Over the past eight weeks, he has completed two courses of prednisone, one lasting two weeks and the other one week. However, symptoms have returned following the completion of each course. The patient has been using a DuoNeb nebulizer multiple times a day, indicating poor asthma control. He is also on Wixela, which he takes twice a day.  In addition to his asthma symptoms, the patient has been experiencing sinus pain and pressure, particularly in the right cheek and forehead. He has a history of sinus infections and uses a CPAP machine. The patient has not smoked and has a scheduled appointment with a pulmonologist in January. He is currently seeking a new pulmonologist closer to his location. The patient's asthma symptoms seem to worsen with high pollen counts, and he is looking forward to colder weather to alleviate these symptoms. Worse since covid in summer as well.        Review of Systems  Constitutional:  Negative for chills, fatigue and fever.  Respiratory:  Positive for shortness of breath and wheezing. Negative for cough and chest tightness.   Cardiovascular:  Negative for chest pain and palpitations.  Gastrointestinal:  Negative for abdominal pain.  Musculoskeletal:  Negative for back pain.  Neurological:  Negative for dizziness, weakness and headaches.  Hematological:  Negative for adenopathy. Does not bruise/bleed easily.  Psychiatric/Behavioral:  Negative for behavioral problems and confusion.     Past Medical History:  Diagnosis Date   Allergy     Arthritis    Asthma    Blood transfusion without reported diagnosis    "as a child"   Cancer (HCC)    basal cell and squamous cell carcinoma   Cancer (HCC)    prostate   Cataract 2023   Clotting disorder (HCC)    Colon polyps    COPD (chronic obstructive pulmonary disease) (HCC) 1975   Difficult airway for intubation 09/28/2022   2015 at [facility]   GERD (gastroesophageal reflux disease)    Gout    High cholesterol    History of hepatitis    due to drug interaction?   History of pancreatitis 2016   ?due to colchicine   History of pulmonary embolus (PE) 2016   History of stomach ulcers    Hypertension    Hypothyroidism    Lung nodule    Neuromuscular disorder (HCC)    restless leg   OSA (obstructive sleep apnea) 2015   has CPAP   Osteopenia    Pneumonia    Seizures (HCC)    x1 10 years ago   Sleep apnea    Thyroid disease    hypothyroid   Ulcer 1985   Urinary incontinence      Social History   Socioeconomic History   Marital status: Married    Spouse name: Not on file   Number of children: 3   Years of education: Not on file   Highest education level: Bachelor's degree (e.g., BA, AB, BS)  Occupational History   Not on file  Tobacco Use   Smoking status: Never   Smokeless  tobacco: Never  Vaping Use   Vaping status: Never Used  Substance and Sexual Activity   Alcohol use: Yes    Alcohol/week: 3.0 standard drinks of alcohol    Types: 3 Glasses of wine per week    Comment: occ wine   Drug use: No   Sexual activity: Yes    Birth control/protection: Surgical    Comment: Prostate removal  Other Topics Concern   Not on file  Social History Narrative   Former Engineer, agricultural at [employer], now Environmental health practitioner.    Married   3 daughters- all live locally   Has 7 grandchildren and 1 great grandchild   Enjoys reading, Environmental manager         Lives in a one story home with a bonus room over the garage.   Social Determinants of Health   Financial  Resource Strain: Low Risk  (08/17/2022)   Overall Financial Resource Strain (CARDIA)    Difficulty of Paying Living Expenses: Not hard at all  Food Insecurity: No Food Insecurity (08/17/2022)   Hunger Vital Sign    Worried About Running Out of Food in the Last Year: Never true    Ran Out of Food in the Last Year: Never true  Transportation Needs: No Transportation Needs (08/17/2022)   PRAPARE - Administrator, Civil Service (Medical): No    Lack of Transportation (Non-Medical): No  Physical Activity: Insufficiently Active (08/17/2022)   Exercise Vital Sign    Days of Exercise per Week: 2 days    Minutes of Exercise per Session: 20 min  Stress: Stress Concern Present (08/17/2022)   [employer] of Occupational Health - Occupational Stress Questionnaire    Feeling of Stress : To some extent  Social Connections: Socially Integrated (08/17/2022)   Social Connection and Isolation Panel [NHANES]    Frequency of Communication with Friends and Family: More than three times a week    Frequency of Social Gatherings with Friends and Family: Twice a week    Attends Religious Services: More than 4 times per year    Active Member of [employer] or Organizations: Yes    Attends Engineer, structural: More than 4 times per year    Marital Status: Married  Catering manager Violence: Not At Risk (08/25/2022)   Humiliation, Afraid, Rape, and Kick questionnaire    Fear of Current or Ex-Partner: No    Emotionally Abused: No    Physically Abused: No    Sexually Abused: No    Past Surgical History:  Procedure Laterality Date   BASAL CELL CARCINOMA EXCISION  2017   MOHS.  beneath right eye   BRAIN SURGERY  1967   Fractured skull in an accident   CHOLECYSTECTOMY  2001   COLON SURGERY  2001   Complication of gall bladder surgery   COLONOSCOPY     multiple - last 06/2017   COLONOSCOPY WITH PROPOFOL N/A 01/17/2023   Procedure: COLONOSCOPY WITH PROPOFOL;  Surgeon: Iva Boop, MD;   Location: [facility];  Service: Gastroenterology;  Laterality: N/A;   COSMETIC SURGERY  2017   Basal cell cancer - MOHS surgery   HERNIA REPAIR  2001   HIP SURGERY     JOINT REPLACEMENT  06/2016   Total hip replacement - left hip   PENILE PROSTHESIS IMPLANT  2016   POLYPECTOMY  01/17/2023   Procedure: POLYPECTOMY;  Surgeon: Iva Boop, MD;  Location: [facility];  Service: Gastroenterology;;   PROSTATECTOMY  2009  SINUS SURGERY WITH INSTATRAK  2014   SQUAMOUS CELL CARCINOMA EXCISION  01/2017   nose   TOOTH EXTRACTION     TOTAL HIP ARTHROPLASTY  06/2016    Family History  Problem Relation Age of Onset   Ulcerative colitis Mother    Colon polyps Mother    Asthma Mother    Arthritis Mother    Skin cancer Mother        basal cell carcinoma   Kidney failure Mother    Atrial fibrillation Mother    Kidney disease Mother    Obesity Mother    Asthma Father    Arthritis Father    Diabetes Father    Heart attack Father    Hyperlipidemia Father    Hypertension Father    Kidney disease Father    Obesity Father    Diabetes Brother    Arthritis Brother    Hyperlipidemia Brother    Brain cancer Brother        GBM   Cancer Brother    Alcohol abuse Brother    Breast cancer Maternal Grandmother    Cancer Maternal Grandmother    Diabetes Paternal Grandfather    Colon cancer Neg Hx    Esophageal cancer Neg Hx    Liver cancer Neg Hx    Pancreatic cancer Neg Hx    Rectal cancer Neg Hx    Stomach cancer Neg Hx    Crohn's disease Neg Hx     Allergies  Allergen Reactions   Baclofen Shortness Of Breath    Asthma exacerbation   Colchicine Other (See Comments)    Pancreatitis   Doxycycline Hives   Statins Other (See Comments)    Joint stiffness   Avelox [Moxifloxacin Hcl In Nacl] Other (See Comments)    SEIZURE   Bactrim [Sulfamethoxazole-Trimethoprim] Hives   Cefdinir Hives   Gabapentin     Eye twitch and difficult focusing his vision   Losartan     Rash     Oregano [Origanum Oil] Cough   Peanut-Containing Drug Products Swelling    Throat swelling   Adhesive  [Tape] Other (See Comments)    "tears skin"   Penicillins Rash    Current Outpatient Medications on File Prior to Visit  Medication Sig Dispense Refill   albuterol (VENTOLIN HFA) 108 (90 Base) MCG/ACT inhaler Inhale 2 puffs into the lungs every 6 (six) hours as needed for wheezing or shortness of breath. 18 g 5   allopurinol (ZYLOPRIM) 100 MG tablet TAKE 1 TABLET(100 MG) BY MOUTH TWICE DAILY 180 tablet 1   amLODipine (NORVASC) 5 MG tablet Take 1 tablet (5 mg total) by mouth 2 (two) times daily. 180 tablet 1   calcium carbonate (OS-CAL - DOSED IN MG OF ELEMENTAL CALCIUM) 1250 (500 Ca) MG tablet Take 1 tablet by mouth daily.     Cholecalciferol (VITAMIN D3) 5000 units CAPS Take by mouth daily.     docusate sodium (COLACE) 100 MG capsule Take 200 mg by mouth daily.      EPINEPHrine 0.3 mg/0.3 mL IJ SOAJ injection Use as directed for severe allergic reactions 2 each 2   fexofenadine (ALLEGRA) 180 MG tablet Take 180 mg by mouth daily.      fluticasone (FLONASE) 50 MCG/ACT nasal spray SPRAY 2 SPRAYS INTO EACH NOSTRIL EVERY DAY 48 mL 1   fluticasone-salmeterol (WIXELA INHUB) 500-50 MCG/ACT AEPB INHALE 1 PUFF INTO THE LUNGS 2 (TWO) TIMES DAILY. IN THE MORNING AND AT BEDTIME. 60 each 2  hydrALAZINE (APRESOLINE) 25 MG tablet Take 1 tablet (25 mg total) by mouth in the morning and at bedtime. 180 tablet 1   ipratropium-albuterol (DUONEB) 0.5-2.5 (3) MG/3ML SOLN 1 vial in nebulizer every 4-6 hours while awake for the next 3-5 days for coughing or wheezing. 360 mL 1   levothyroxine (SYNTHROID) 75 MCG tablet Take 1 tablet (75 mcg total) by mouth daily before breakfast. 90 tablet 1   Magnesium 250 MG TABS Take by mouth daily.     montelukast (SINGULAIR) 10 MG tablet Take one tablet once at night for coughing or wheezing 90 tablet 3   tiZANidine (ZANAFLEX) 2 MG tablet TAKE 1 TO 2 TABLETS(2 TO 4 MG) BY  MOUTH AT BEDTIME AS NEEDED FOR MUSCLE SPASMS 60 tablet 1   No current facility-administered medications on file prior to visit.    BP (!) 150/89   Pulse 65   Temp 97.8 F (36.6 C)   Resp 20   Ht 5\' 6"  (1.676 m)   Wt 237 lb (107.5 kg)   SpO2 96%   BMI 38.25 kg/m        Objective:   Physical Exam  General Mental Status- Alert. General Appearance- Not in acute distress.   Skin General: Color- Normal Color. Moisture- Normal Moisture.  Neck No JVD.  Chest and Lung Exam Auscultation: Breath Sounds:- even and unlabored. But shallow.  Cardiovascular Auscultation:Rythm- Regular, rate and rhythm. Murmurs & Other Heart Sounds:Auscultation of the heart reveals- No Murmurs.  Abdomen Inspection:-Inspeection Normal. Palpation/Percussion:Note:No mass. Palpation and Percussion of the abdomen reveal- Non Tender, Non Distended + BS, no rebound or guarding.   Neurologic Cranial Nerve exam:- CN III-XII intact(No nystagmus), symmetric smile. Strength:- 5/5 equal and symmetric strength both upper and lower extremities.       Assessment & Plan:   Assessment and Plan    Asthma Increased wheezing, particularly in the afternoon and at night. Increased use of DuoNeb nebulizer. Two courses of prednisone in the last eight weeks with temporary relief, but symptoms return after tapering off. Currently on Wixela twice daily. -Start a six-day course of prednisone taper. -Continue Wixela twice daily. -Continue DuoNeb nebulizer as needed. -referral to pulmonolgist  Sinusitis(rt tm mild red in center) Complaints of pain in right cheek and pressure in sinuses. Tenderness on palpation. History of sinus infections. Currently using CPAP. -Start a five-day course of azithromycin (Z-Pak) due to patient's antibiotic restrictions.  Follow-up in 10 days pcp or with me. sooner if needed.        Esperanza Richters, PA-C

## 2023-03-03 NOTE — Patient Instructions (Signed)
Asthma Increased wheezing, particularly in the afternoon and at night. Increased use of DuoNeb nebulizer. Two courses of prednisone in the last eight weeks with temporary relief, but symptoms return after tapering off. Currently on Wixela twice daily. -Start a six-day course of prednisone taper. -Continue Wixela twice daily. -Continue DuoNeb nebulizer as needed. -referral to pulmonolgist  Sinusitis(rt tm mild red in center) Complaints of pain in right cheek and pressure in sinuses. Tenderness on palpation. History of sinus infections. Currently using CPAP. -Start a five-day course of azithromycin (Z-Pak) due to patient's antibiotic restrictions.  Follow-up in 10 days pcp or with me. sooner if needed.

## 2023-03-08 ENCOUNTER — Encounter: Payer: PPO | Admitting: Physical Therapy

## 2023-03-09 DIAGNOSIS — L570 Actinic keratosis: Secondary | ICD-10-CM | POA: Diagnosis not present

## 2023-03-09 DIAGNOSIS — C44719 Basal cell carcinoma of skin of left lower limb, including hip: Secondary | ICD-10-CM | POA: Diagnosis not present

## 2023-03-09 DIAGNOSIS — D485 Neoplasm of uncertain behavior of skin: Secondary | ICD-10-CM | POA: Diagnosis not present

## 2023-03-13 ENCOUNTER — Ambulatory Visit: Payer: PPO | Admitting: Primary Care

## 2023-03-13 ENCOUNTER — Encounter: Payer: Self-pay | Admitting: Primary Care

## 2023-03-13 VITALS — BP 144/75 | HR 59 | Temp 97.9°F | Ht 66.0 in | Wt 234.4 lb

## 2023-03-13 DIAGNOSIS — Z880 Allergy status to penicillin: Secondary | ICD-10-CM

## 2023-03-13 DIAGNOSIS — J4550 Severe persistent asthma, uncomplicated: Secondary | ICD-10-CM | POA: Diagnosis not present

## 2023-03-13 LAB — POCT EXHALED NITRIC OXIDE: FeNO level (ppb): 11

## 2023-03-13 MED ORDER — PREDNISONE 10 MG PO TABS: ORAL_TABLET | ORAL | 0 refills | Status: DC

## 2023-03-13 NOTE — Progress Notes (Signed)
@Patient  ID: Michael [name], male    DOB: 1950-06-02, 72 y.o.   MRN: [id number]  Chief Complaint  Patient presents with   Follow-up    Referring provider: Marisue Brooklyn  HPI: 72 year old male. Never smoked. PMH significant for severe persistent asthma, recurrent sinusitis, hyperlipidemia, hx pulmonary embolism, GERD, retention, colonic polyps, hx prostate cancer, restless leg syndrome, gout, multiple drug allergies.  03/13/2023 Discussed the use of AI scribe software for clinical note transcription with the patient, who gave verbal consent to proceed.  History of Present Illness   Patient presents today for asthma follow-up. Recent exacerbation of asthma symptoms. He has been on multiple biologic shots in the past, including Nucala, Xolair, Dupixent, and Fasenra, but these were discontinued due to copay issues. His current regimen includes Advair 500-60mcg, Singulair, DuoNebs, and prednisone as needed, with occasional breakthrough symptoms.   Approximately a month ago, the patient experienced a severe respiratory bug, which he describes as being close to pneumonia. This resulted in a significant increase in symptoms, including a terrible cough and difficulty getting air in. He sought care from an alternate doctor at his primary care center, who prescribed a short burst of steroids and a Z-Pak, the only antibiotic he can take. This treatment seemed to bring his symptoms back under control.  The patient also has a small lung nodule, which is being monitored with periodic CT scans. He has had two follow-ups so far, with the most recent scan in October. He is considered low risk due to his non-smoking status.  His asthma symptoms typically flare up twice a year, in spring and fall. This year, however, has been particularly challenging, with three rounds of steroids required in the fall. He is up to date with vaccines, including the flu and pneumonia vaccines.      Allergies   Allergen Reactions   Baclofen Shortness Of Breath    Asthma exacerbation   Colchicine Other (See Comments)    Pancreatitis   Doxycycline Hives   Statins Other (See Comments)    Joint stiffness   Avelox [Moxifloxacin Hcl In Nacl] Other (See Comments)    SEIZURE   Bactrim [Sulfamethoxazole-Trimethoprim] Hives   Cefdinir Hives   Gabapentin     Eye twitch and difficult focusing his vision   Losartan     Rash    Oregano [Origanum Oil] Cough   Peanut-Containing Drug Products Swelling    Throat swelling   Adhesive  [Tape] Other (See Comments)    "tears skin"   Penicillins Rash    Immunization History  Administered Date(s) Administered   Fluad Quad(high Dose 65+) 01/29/2019, 07/03/2020, 01/12/2021   Fluad Trivalent(High Dose 65+) 01/24/2023   Influenza Split 04/08/2013   Influenza, High Dose Seasonal PF 01/19/2018   Influenza, Quadrivalent, Recombinant, Inj, Pf 01/13/2017   Influenza-Unspecified 01/21/2014, 01/20/2015, 01/21/2016, 12/10/2021, 01/24/2022   PFIZER Comirnaty(Gray Top)Covid-19 Tri-Sucrose Vaccine 10/09/2020   PFIZER(Purple Top)SARS-COV-2 Vaccination 06/06/2019, 07/01/2019, 03/03/2020, 01/24/2022   Pfizer Covid-19 Vaccine Bivalent Booster 17yrs & up 01/20/2021   Pneumococcal Conjugate-13 08/15/2013, 11/16/2018   Pneumococcal Polysaccharide-23 11/01/2013, 12/18/2019   Respiratory Syncytial Virus Vaccine,Recomb Aduvanted(Arexvy) 02/07/2022   Tdap 08/15/2013   Zoster Recombinant(Shingrix) 01/19/2018, 06/15/2018   Zoster, Live 05/14/2015    Past Medical History:  Diagnosis Date   Allergy    Arthritis    Asthma    Blood transfusion without reported diagnosis    "as a child"   Cancer (HCC)    basal cell and squamous cell carcinoma  Cancer [facility])    prostate   Cataract 2023   Clotting disorder [facility])    Colon polyps    COPD (chronic obstructive pulmonary disease) (HCC) 1975   Difficult airway for intubation 09/28/2022   2015 at [facility]   GERD  (gastroesophageal reflux disease)    Gout    High cholesterol    History of hepatitis    due to drug interaction?   History of pancreatitis 2016   ?due to colchicine   History of pulmonary embolus (PE) 2016   History of stomach ulcers    Hypertension    Hypothyroidism    Lung nodule    Neuromuscular disorder (HCC)    restless leg   OSA (obstructive sleep apnea) 2015   has CPAP   Osteopenia    Pneumonia    Seizures (HCC)    x1 10 years ago   Sleep apnea    Thyroid disease    hypothyroid   Ulcer 1985   Urinary incontinence     Tobacco History: Social History   Tobacco Use  Smoking Status Never  Smokeless Tobacco Never   Counseling given: Not Answered   Outpatient Medications Prior to Visit  Medication Sig Dispense Refill   albuterol (VENTOLIN HFA) 108 (90 Base) MCG/ACT inhaler Inhale 2 puffs into the lungs every 6 (six) hours as needed for wheezing or shortness of breath. 18 g 5   allopurinol (ZYLOPRIM) 100 MG tablet TAKE 1 TABLET(100 MG) BY MOUTH TWICE DAILY 180 tablet 1   amLODipine (NORVASC) 5 MG tablet Take 1 tablet (5 mg total) by mouth 2 (two) times daily. 180 tablet 1   calcium carbonate (OS-CAL - DOSED IN MG OF ELEMENTAL CALCIUM) 1250 (500 Ca) MG tablet Take 1 tablet by mouth daily.     Cholecalciferol (VITAMIN D3) 5000 units CAPS Take by mouth daily.     docusate sodium (COLACE) 100 MG capsule Take 200 mg by mouth daily.      EPINEPHrine 0.3 mg/0.3 mL IJ SOAJ injection Use as directed for severe allergic reactions 2 each 2   fexofenadine (ALLEGRA) 180 MG tablet Take 180 mg by mouth daily.      fluticasone (FLONASE) 50 MCG/ACT nasal spray SPRAY 2 SPRAYS INTO EACH NOSTRIL EVERY DAY 48 mL 1   fluticasone-salmeterol (WIXELA INHUB) 500-50 MCG/ACT AEPB INHALE 1 PUFF INTO THE LUNGS 2 (TWO) TIMES DAILY. IN THE MORNING AND AT BEDTIME. 60 each 2   hydrALAZINE (APRESOLINE) 25 MG tablet Take 1 tablet (25 mg total) by mouth in the morning and at bedtime. 180 tablet 1    ipratropium-albuterol (DUONEB) 0.5-2.5 (3) MG/3ML SOLN 1 vial in nebulizer every 4-6 hours while awake for the next 3-5 days for coughing or wheezing. 360 mL 1   levothyroxine (SYNTHROID) 75 MCG tablet Take 1 tablet (75 mcg total) by mouth daily before breakfast. 90 tablet 1   Magnesium 250 MG TABS Take by mouth daily.     montelukast (SINGULAIR) 10 MG tablet Take one tablet once at night for coughing or wheezing 90 tablet 3   predniSONE (STERAPRED UNI-PAK 21 TAB) 10 MG (21) TBPK tablet Taper over 6 days. 21 tablet 0   tiZANidine (ZANAFLEX) 2 MG tablet TAKE 1 TO 2 TABLETS(2 TO 4 MG) BY MOUTH AT BEDTIME AS NEEDED FOR MUSCLE SPASMS 60 tablet 1   No facility-administered medications prior to visit.   Review of Systems  Review of Systems  Constitutional: Negative.   HENT: Negative.    Respiratory:  Negative  for cough, shortness of breath and wheezing.   Cardiovascular: Negative.    Physical Exam  BP (!) 144/75 (BP Location: Left Arm, Patient Position: Sitting, Cuff Size: Normal)   Pulse (!) 59   Temp 97.9 F (36.6 C) (Oral)   Ht 5\' 6"  (1.676 m)   Wt 234 lb 6.4 oz (106.3 kg)   SpO2 96%   BMI 37.83 kg/m  Physical Exam Constitutional:      Appearance: Normal appearance.  HENT:     Head: Normocephalic and atraumatic.  Cardiovascular:     Rate and Rhythm: Normal rate and regular rhythm.  Pulmonary:     Effort: Pulmonary effort is normal.     Breath sounds: Normal breath sounds. No wheezing, rhonchi or rales.     Comments: CTA, diminished. No wheezing or rhonchi Musculoskeletal:        General: Normal range of motion.  Skin:    General: Skin is warm and dry.  Neurological:     General: No focal deficit present.     Mental Status: He is alert and oriented to person, place, and time. Mental status is at baseline.  Psychiatric:        Mood and Affect: Mood normal.        Behavior: Behavior normal.        Thought Content: Thought content normal.        Judgment: Judgment normal.      Lab Results:  CBC    Component Value Date/Time   WBC 6.5 08/17/2021 1635   RBC 4.79 08/17/2021 1635   HGB 15.6 08/17/2021 1635   HGB 15.4 02/09/2017 1629   HCT 46.1 08/17/2021 1635   HCT 45.3 02/09/2017 1629   PLT 253.0 08/17/2021 1635   PLT 340 02/09/2017 1629   MCV 96.2 08/17/2021 1635   MCV 94 02/09/2017 1629   MCH 32.5 12/18/2019 0820   MCHC 33.8 08/17/2021 1635   RDW 13.1 08/17/2021 1635   RDW 13.2 02/09/2017 1629   LYMPHSABS 1.5 08/17/2021 1635   LYMPHSABS 1.5 02/09/2017 1629   MONOABS 0.7 08/17/2021 1635   EOSABS 0.4 08/17/2021 1635   EOSABS 0.1 02/09/2017 1629   BASOSABS 0.1 08/17/2021 1635   BASOSABS 0.0 02/09/2017 1629    BMET    Component Value Date/Time   NA 139 01/24/2023 0833   K 3.8 01/24/2023 0833   CL 104 01/24/2023 0833   CO2 28 01/24/2023 0833   GLUCOSE 109 (H) 01/24/2023 0833   BUN 12 01/24/2023 0833   CREATININE 1.08 01/24/2023 0833   CREATININE 1.11 12/18/2019 0820   CALCIUM 9.2 01/24/2023 0833   GFRNONAA >60 06/09/2018 2136   GFRAA >60 06/09/2018 2136    BNP No results found for: "BNP"  ProBNP No results found for: "PROBNP"  Imaging: No results found.   Assessment & Plan:   1. Severe persistent asthma without complication   Severe Persistent Asthma Recent exacerbation likely secondary to respiratory infection, managed with a short course of prednisone and Z-Pak. Asthma symptoms typically flare up due to allergies twice a year, every Spring and Fall. Currently stable on Advair 500-56mcg one puff BID, Singulair, and DuoNeb as needed. Previously on biologics but discontinued due to copay issues. -Continue current regimen, advised patient if he has more than 2-3 exacerbations a year requiring oral prednisone we should discuss resuming biologics. He would like to hold off at this time. FENO today was 11. Rx prednisone taper for patient to have on hand for future exacerbation (patient  will notify office if he needs to use). Refer to  Allergy for re-evaluation of penicillin allergy when able to come off antihistamines in the winter months.  Sleep Apnea Managed with noninvasive ventilator. -Continue current management.  Lung Nodule Small 4mm nodule noted on recent CT scan, low risk due to non-smoker status. Followed up twice already. -Plan for another follow-up in 12-18 months.  Vaccinations Up to date on all recommended vaccines including RSV. -Continue routine vaccinations as recommended.  Follow-up Encourage patient to contact the office at the onset of symptoms for early intervention. Plan for a virtual or in-person visit as needed.     Glenford Bayley, NP 03/13/2023

## 2023-03-13 NOTE — Patient Instructions (Addendum)
-  ASTHMA: Severe persistent asthma is a long-term condition where the airways in the lungs are always inflamed and can become even more swollen and narrow when triggered. You recently had an exacerbation likely due to a respiratory infection, which was managed with a short course of prednisone and Z-Pak. You should continue your current regimen of Advair, Singulair, and DuoNeb as needed. We will send in a prednisone taper for you to have on hand for future exacerbations (please notify office by mychart if you have to use). If having more than 2-3 exacerbations a year requiring prednisone we should discuss resuming biologics. Additionally, we will refer you to an allergist for re-evaluation of your penicillin allergy in the winter months.  -SLEEP APNEA: Your condition is currently managed with a noninvasive ventilator, and you should continue with this management.  -LUNG NODULE: A lung nodule is a small growth in the lung that is usually benign, especially in non-smokers. Your recent CT scan showed a small 4mm nodule, and given your non-smoker status, it is considered low risk. We will plan for another follow-up scan in 12-18 months.  -VACCINATIONS: You are up to date on all recommended vaccines, including the flu, pneumonia, and RSV vaccines. Continue to follow routine vaccination recommendations.  RX: Take 4 tabs daily x 3 days, 3 tabs daily x 3 days, 2 tabs daily x 3 days, 1 tab daily x 3 days (keep on hand for asthma exacerbation)  Orders: FENO [id number]   Follow-up 6 months with Dr. Everardo All or sooner if you have uncontrolled symptoms

## 2023-03-14 ENCOUNTER — Encounter: Payer: Self-pay | Admitting: Physical Therapy

## 2023-03-14 ENCOUNTER — Ambulatory Visit: Payer: PPO | Attending: Sports Medicine | Admitting: Physical Therapy

## 2023-03-14 DIAGNOSIS — M5412 Radiculopathy, cervical region: Secondary | ICD-10-CM | POA: Diagnosis not present

## 2023-03-14 DIAGNOSIS — M546 Pain in thoracic spine: Secondary | ICD-10-CM | POA: Diagnosis not present

## 2023-03-14 DIAGNOSIS — R29898 Other symptoms and signs involving the musculoskeletal system: Secondary | ICD-10-CM

## 2023-03-14 NOTE — Therapy (Signed)
OUTPATIENT PHYSICAL THERAPY TREATMENT   Patient Name: Michael [name] MRN: [id number] DOB:March 08, 1951, 72 y.o., male Today's Date: 03/14/2023  END OF SESSION:  PT End of Session - 03/14/23 0806     Visit Number 6    Date for PT Re-Evaluation 03/21/23    Progress Note Due on Visit 10    PT Start Time 0804    PT Stop Time 0845    PT Time Calculation (min) 41 min    Activity Tolerance Patient tolerated treatment well    Behavior During Therapy [facility] for tasks assessed/performed               Past Medical History:  Diagnosis Date   Allergy    Arthritis    Asthma    Blood transfusion without reported diagnosis    "as a child"   Cancer (HCC)    basal cell and squamous cell carcinoma   Cancer (HCC)    prostate   Cataract 2023   Clotting disorder (HCC)    Colon polyps    COPD (chronic obstructive pulmonary disease) (HCC) 1975   Difficult airway for intubation 09/28/2022   2015 at [facility]   GERD (gastroesophageal reflux disease)    Gout    High cholesterol    History of hepatitis    due to drug interaction?   History of pancreatitis 2016   ?due to colchicine   History of pulmonary embolus (PE) 2016   History of stomach ulcers    Hypertension    Hypothyroidism    Lung nodule    Neuromuscular disorder (HCC)    restless leg   OSA (obstructive sleep apnea) 2015   has CPAP   Osteopenia    Pneumonia    Seizures (HCC)    x1 10 years ago   Sleep apnea    Thyroid disease    hypothyroid   Ulcer 1985   Urinary incontinence    Past Surgical History:  Procedure Laterality Date   BASAL CELL CARCINOMA EXCISION  2017   MOHS.  beneath right eye   BRAIN SURGERY  1967   Fractured skull in an accident   CHOLECYSTECTOMY  2001   COLON SURGERY  2001   Complication of gall bladder surgery   COLONOSCOPY     multiple - last 06/2017   COLONOSCOPY WITH PROPOFOL N/A 01/17/2023   Procedure: COLONOSCOPY WITH PROPOFOL;  Surgeon: Iva Boop, MD;  Location: [facility]  END[facility];  Service: Gastroenterology;  Laterality: N/A;   COSMETIC SURGERY  2017   Basal cell cancer - MOHS surgery   HERNIA REPAIR  2001   HIP SURGERY     JOINT REPLACEMENT  06/2016   Total hip replacement - left hip   PENILE PROSTHESIS IMPLANT  2016   POLYPECTOMY  01/17/2023   Procedure: POLYPECTOMY;  Surgeon: Iva Boop, MD;  Location: [facility] ENDOSCOPY;  Service: Gastroenterology;;   PROSTATECTOMY  2009   SINUS SURGERY WITH INSTATRAK  2014   SQUAMOUS CELL CARCINOMA EXCISION  01/2017   nose   TOOTH EXTRACTION     TOTAL HIP ARTHROPLASTY  06/2016   Patient Active Problem List   Diagnosis Date Noted   Skin lesion 01/24/2023   Benign neoplasm of transverse colon 01/17/2023   History of colonic polyps 01/17/2023   Chronic scapular pain 12/23/2022   Difficult airway for intubation 09/28/2022   RLS (restless legs syndrome) 07/14/2021   Bradycardia 02/12/2021   Tremor of right hand 10/21/2020   Seasonal allergic conjunctivitis 06/16/2020  Gastroesophageal reflux disease 02/16/2020   Aspirin-exacerbated respiratory disease (AERD) 04/12/2019   Adverse food reaction 04/12/2019   Left knee pain 08/22/2017   Acute sinusitis 06/08/2017   Hypothyroid 03/24/2017   Hyperlipidemia 03/24/2017   Gout 03/24/2017   Squamous cell carcinoma 03/24/2017   History of pulmonary embolism 03/24/2017   History of prostate cancer 03/24/2017   OSA (obstructive sleep apnea) 03/24/2017   Hypertension 03/24/2017   Severe persistent asthma 02/09/2017   Other allergic rhinitis 02/09/2017   Recurrent sinusitis 02/09/2017   History of nasal polyp 02/09/2017   Allergy to multiple antibiotics 02/09/2017    PCP: Val EagleDaryel Gerald, MD  REFERRING PROVIDER: Reino Bellis, DO  REFERRING DIAG: L cervical radiculopathy  THERAPY DIAG:  Radiculopathy, cervical region  Pain in thoracic spine  Weakness of left shoulder  Rationale for Evaluation and Treatment: Rehabilitation  ONSET DATE: x 5 years,  progressive over last 6 months  SUBJECTIVE:                                                                                                                                                                                                         SUBJECTIVE STATEMENT:  been sick with pneumonia.   Cpughing a lot so pain has been worse.  Cough is gone now.  Hand dominance: Right  PERTINENT HISTORY:  Progressive pain L shoulder blade, worsened last 6 months, therefore sought help from MD,referred to PT for probable facet jt arthropathy and radiculopathy  PAIN:  Are you having pain? Yes: NPRS scale: 6/10 Pain location: L medial scapular border Pain description: constant Aggravating factors: recliner for reading, lying down, bending head forward Relieving factors: muscle relaxers at times  PRECAUTIONS: None  RED FLAGS: None     WEIGHT BEARING RESTRICTIONS: No  FALLS:  Has patient fallen in last 6 months? 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  DIAGNOSTIC FINDINGS:  Na   PATIENT SURVEYS:  NDI 15/50  COGNITION: Overall cognitive status: Within functional limits for tasks assessed  SENSATION: Wfl with test today but reports intermittent numbness B hands with prolonged reading  POSTURE: rounded shoulders, increased thoracic kyphosis, and depressed L shoulder  PALPATION: Non tender over l medial scapular border, rotator cuff musculature.  Very tender with light palpation along  C 7 to T 6 spinous processes, and with PA segmental testing, more tender C7, T1, T2 levels. Very tender with replication of familiar pain L 1st rib/upper traps, and L levator  scapulae   CERVICAL ROM:   Active ROM A/PROM (deg) eval  Flexion 60 p!  Extension 60 p!  Right lateral flexion nt  Left lateral flexion nt  Right rotation 70 p!  Left rotation wnl   THORACIC SPINE: flex, pain, 80%, ext wnl, rotation 80% with pain terminal ranges UPPER EXTREMITY ROM: All B shoulder AROM WNL UPPER EXTREMITY MMT: Noted poor control of L GH jt with seated rows ex with theraband  MMT Right eval Left eval  Shoulder flexion  4+  Shoulder extension    Shoulder abduction    Shoulder adduction    Shoulder extension    Shoulder internal rotation    Shoulder external rotation    Middle trapezius  4-  Lower trapezius  4-  Elbow flexion  4+  Elbow extension    Wrist flexion    Wrist extension    Wrist ulnar deviation    Wrist radial deviation    Wrist pronation    Wrist supination    Grip strength     (Blank rows =wnl)  CERVICAL SPECIAL TESTS:  Cranial cervical flexion test: Negative, Spurling's test: Positive, and Distraction test: Negative  TODAY'S TREATMENT:                                                                                                                              DATE:   03/14/23 Therapeutic Exercise: to improve strength and mobility.  Demo, verbal and tactile cues throughout for technique. Nerve glides Wall cat cows Review HEP Manual Therapy: to decrease muscle spasm and pain and improve mobility Thoracic PA mobs, STM/TPR to cervical paraspinals, L UT, L L/S, PA mobs cervical spine, NAGs into rotation, suboccipital release, skilled palpation and monitoring during dry needling. Trigger Point Dry-Needling  Treatment instructions: Expect mild to moderate muscle soreness. S/S of pneumothorax if dry needled over a lung field, and to seek immediate medical attention should they occur. Patient verbalized understanding of these instructions and education. Patient Consent Given: Yes Education handout provided: Previously provided Muscles  treated: bil cervical multifidi C4-6, L UT, L L/S, L thoracic erector spinae T6-8.   Electrical stimulation performed: No Parameters: N/A Treatment response/outcome: Twitch Response Elicited and Palpable Increase in Muscle Length   02/22/23 Therapeutic Exercise: to improve strength and mobility.  Demo, verbal and tactile cues throughout for technique. UBE L1 x 5 min forward Reviewed HEP, keep arms below 90 deg with exercises to avoid irritation Manual Therapy: to decrease muscle spasm and pain and improve mobility STM/TPR to cervical paraspinals, L UT, L L/S, L rhomboids, skilled palpation and monitoring during dry needling. Trigger Point Dry-Needling  Treatment instructions: Expect mild to moderate muscle soreness. S/S of pneumothorax if dry needled over a lung field, and to seek immediate  medical attention should they occur. Patient verbalized understanding of these instructions and education. Patient Consent Given: Yes Education handout provided: Previously provided Muscles treated: L UT, L cervical multifidi C4-6, L L/S, L supraspinatis Electrical stimulation performed: No Parameters: N/A Treatment response/outcome: Twitch Response Elicited and Palpable Increase in Muscle Length  02/16/23: Therex:  UBE 2 .5 min F, 3 min B at 1.5 level R Side lying:  for open books L UE, utilized 1# wt to pt of fatigue L shoulder, around 30 reps Standing with ball behind head , for light cervical retraction combined with cervical rotation, needed cues to position correctly to avoid going into a forward head position Standing with ball behind head, for "serving tray movement B shoulders, initially B, then switched to alt arms.  Kinesiotaping: utilized 2 I pieces attachments ant delts and medial delts, with 35% pull distal to proximal middle and upper traps. Utilized to relieve tension, assist scapular retractors in relieving load of L UE on post lower neck 02/09/23: Cervical traction: utilized the  static cervical traction unit for 10 min, at 16 #, to provide decompression of cervical spine, and nerve roots.    Manual Therapy: to decrease muscle spasm and pain and improve mobility - performed by Jena Gauss, PT  STM/TPR to L UT, levator scapulae, rhomboids, skilled palpation and monitoring during dry needling. Trigger Point Dry-Needling  Treatment instructions: Expect mild to moderate muscle soreness. S/S of pneumothorax if dry needled over a lung field, and to seek immediate medical attention should they occur. Patient verbalized understanding of these instructions and education. Patient Consent Given: Yes Education handout provided: Previously provided Muscles treated: L UT, L levator scapulae Electrical stimulation performed: No Parameters: N/A Treatment response/outcome: Twitch Response Elicited and Palpable Increase in Muscle Length Did not add more ex and advised pt to stop current therex as per his description the foam roller and arm movements are aggravating his radicular Sx L    PATIENT EDUCATION:  Education details: continue HEP as tolerated Person educated: Patient Education method: Medical illustrator Education comprehension: verbalized understanding  HOME EXERCISE PROGRAM: Access Code: [email or web address] URL: [email or web address] Date: 02/01/2023 Prepared by: Harrie Foreman  Exercises - Supine Chest Stretch on Foam Roll  - 1 x daily - 7 x weekly - 2 sets - 10 reps - Thoracic Foam Roll Mobilization Backstroke  - 1 x daily - 7 x weekly - 2 sets - 10 reps - Open Book Chest Stretch on Towel Roll  - 1 x daily - 7 x weekly - 2 sets - 10 reps - Standing Thoracic Open Book at Wall  - 1 x daily - 7 x weekly - 1 sets - 10 reps  ASSESSMENT:  CLINICAL IMPRESSION: Teyon Sneed [place] reports recovering from bacterial PNA so missed over 2 weeks of therapy.  Radicular symptoms have worsened down L arm to triceps.  Reported immediate relief with  manual therapy and trigger point dry needling.  At this time, due to his schedule, he would like to be placed on 30 day hold.  Did recommend he return to his Dr. Margaretha Sheffield and discuss next steps, as PT only provides 2-3 days relief before symptoms return.  Enzi Barnhard Helmuth continues to demonstrate potential for improvement and would benefit from continued skilled therapy to address impairments.        OBJECTIVE IMPAIRMENTS: decreased ROM, decreased strength, hypomobility, increased fascial restrictions, impaired flexibility, impaired sensation, impaired UE functional use, postural dysfunction, and pain.   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   REHAB POTENTIAL: Good  CLINICAL DECISION MAKING: Evolving/moderate complexity  EVALUATION COMPLEXITY: Moderate   GOALS: Goals reviewed with patient? Yes  SHORT TERM GOALS: Target date: 2 weeks, 02/07/23  I HEP Baseline:  Goal status: MET 02/01/23- reviewed and updated   LONG TERM GOALS: Target date: 8 weeks, 03/21/23  NDI improve to 8/50 from 15/50 Baseline:  Goal status: IN PROGRESS  2.  Pain free full cervical spine flexion  Baseline: 70% with heightened pain L medial scapula Goal status: IN PROGRESS  3.  Increase strength L middle and lower traps to 5/5, symmetrical with R, for improved B UE stability for reaching forward Baseline: 4- Goal status: IN PROGRESS  4.  Improve sleep patterns , patient have no sleep interruption consistently Baseline: wakes frequently Goal status: IN PROGRESS 02/22/23- woken by pain 3AM     PLAN:  PT FREQUENCY: 1x/week  PT DURATION: 8 weeks  PLANNED INTERVENTIONS: Therapeutic  exercises, Therapeutic activity, Neuromuscular re-education, Balance training, Gait training, Patient/Family education, Self Care, and Joint mobilization  PLAN FOR NEXT SESSION:   30 day hold   Jena Gauss, PT, DPT 03/14/2023, 9:05 AM

## 2023-04-04 ENCOUNTER — Encounter: Payer: Self-pay | Admitting: Sports Medicine

## 2023-04-05 DIAGNOSIS — C44719 Basal cell carcinoma of skin of left lower limb, including hip: Secondary | ICD-10-CM | POA: Diagnosis not present

## 2023-04-17 ENCOUNTER — Ambulatory Visit (INDEPENDENT_AMBULATORY_CARE_PROVIDER_SITE_OTHER): Payer: PPO | Admitting: Family

## 2023-04-17 VITALS — BP 139/76 | HR 56 | Temp 98.7°F | Resp 16 | Ht 66.0 in | Wt 236.0 lb

## 2023-04-17 DIAGNOSIS — I10 Essential (primary) hypertension: Secondary | ICD-10-CM | POA: Diagnosis not present

## 2023-04-17 DIAGNOSIS — R600 Localized edema: Secondary | ICD-10-CM | POA: Diagnosis not present

## 2023-04-17 DIAGNOSIS — L03116 Cellulitis of left lower limb: Secondary | ICD-10-CM | POA: Insufficient documentation

## 2023-04-17 MED ORDER — AMLODIPINE BESYLATE 5 MG PO TABS
5.0000 mg | ORAL_TABLET | Freq: Every day | ORAL | Status: DC
Start: 2023-04-17 — End: 2023-07-11

## 2023-04-17 MED ORDER — LISINOPRIL 10 MG PO TABS: 10.0000 mg | ORAL_TABLET | Freq: Every day | ORAL | 0 refills | Status: DC

## 2023-04-17 MED ORDER — CLINDAMYCIN HCL 300 MG PO CAPS: 300.0000 mg | ORAL_CAPSULE | Freq: Three times a day (TID) | ORAL | 0 refills | Status: AC

## 2023-04-17 NOTE — Patient Instructions (Signed)
VISIT SUMMARY:  During today's visit, we addressed your leg swelling, a potential skin infection, and your blood pressure management. We also discussed your recent visual disturbances and family history of lymphedema. Additionally, we confirmed that you have received your influenza vaccine.  YOUR PLAN:  -LOWER EXTREMITY EDEMA: Lower extremity edema means swelling in your legs, which can be caused by various factors including medication. We will reduce your Amlodipine to 5mg  once daily and start you on Lisinopril. Please return in 2 weeks to assess your blood pressure and swelling.  -SKIN INFECTION: A skin infection is when bacteria enter the skin, causing redness and swelling. We noticed redness around the site where your basal cell carcinoma was removed, which may indicate a mild infection. We are starting you on Clindamycin. If the redness worsens or enlarges, please call our office.  -HYPERTENSION: Hypertension is high blood pressure, which can cause various symptoms including visual changes. We will reduce your Amlodipine to 5mg  once daily and start you on Lisinopril. Please return in 2 weeks to assess your blood pressure and repeat labs.  -GENERAL HEALTH MAINTENANCE: We confirmed that you have received your influenza vaccine. Keeping up with vaccinations is important for your overall health.  INSTRUCTIONS:  Please return in 2 weeks to assess your blood pressure and swelling. If the redness around your basal cell carcinoma removal site worsens or enlarges, call our office immediately.

## 2023-04-17 NOTE — Assessment & Plan Note (Signed)
Bp fair today.   Blood pressure readings variable at home. -Reduce Amlodipine to 5mg  once daily. -Start Lisinopril 10mg .  -Return in 2 weeks to assess blood pressure and repeat labs.

## 2023-04-17 NOTE — Progress Notes (Addendum)
Subjective:     Patient ID: Michael [name], male    DOB: Sep 21, 1950, 72 y.o.   MRN: [id number]  Chief Complaint  Patient presents with   Leg Swelling    Patient complains of bilateral leg swelling    Wound Check    Wound ln left leg after basal cell removal, check for infection    HPI  Discussed the use of AI scribe software for clinical note transcription with the patient, who gave verbal consent to proceed.  History of Present Illness    The patient, with a history of hypertension, eczema, and gout, presents for a wound check and ongoing bilateral leg swelling. He reports that the leg swelling has been persistent since a trip to [place] over the summer and has been worsening. Bilateral LE doppler was performed back in August and was negative for cellulitis. The swelling is so severe that it impedes his ability to walk when wearing compression socks. He also expresses concern about a recent basal cell removal site on his left leg, fearing it may be infected.  In addition to these issues, the patient reports visual disturbances in the first few hours after taking his blood pressure medication, amlodipine. He describes his distance vision as being off, which resolves after a few hours. He also mentions a history of dizziness spells.  The patient has a family history of lymphedema and is concerned about the possibility of developing the condition. He also expresses concern about his mobility and the impact of his health issues on his ability to exercise and participate in daily activities.         Health Maintenance Due  Topic Date Due   COVID-19 Vaccine (7 - 2024-25 season) 12/25/2022    Past Medical History:  Diagnosis Date   Allergy    Arthritis    Asthma    Blood transfusion without reported diagnosis    "as a child"   Cancer (HCC)    basal cell and squamous cell carcinoma   Cancer (HCC)    prostate   Cataract 2023   Clotting disorder (HCC)    Colon polyps     COPD (chronic obstructive pulmonary disease) (HCC) 1975   Difficult airway for intubation 09/28/2022   2015 at [facility]   GERD (gastroesophageal reflux disease)    Gout    High cholesterol    History of hepatitis    due to drug interaction?   History of pancreatitis 2016   ?due to colchicine   History of pulmonary embolus (PE) 2016   History of stomach ulcers    Hypertension    Hypothyroidism    Lung nodule    Neuromuscular disorder (HCC)    restless leg   OSA (obstructive sleep apnea) 2015   has CPAP   Osteopenia    Pneumonia    Seizures (HCC)    x1 10 years ago   Sleep apnea    Thyroid disease    hypothyroid   Ulcer 1985   Urinary incontinence     Past Surgical History:  Procedure Laterality Date   BASAL CELL CARCINOMA EXCISION  2017   MOHS.  beneath right eye   BRAIN SURGERY  1967   Fractured skull in an accident   CHOLECYSTECTOMY  2001   COLON SURGERY  2001   Complication of gall bladder surgery   COLONOSCOPY     multiple - last 06/2017   COLONOSCOPY WITH PROPOFOL N/A 01/17/2023   Procedure: COLONOSCOPY WITH PROPOFOL;  Surgeon:  Iva Boop, MD;  Location: [facility];  Service: Gastroenterology;  Laterality: N/A;   COSMETIC SURGERY  2017   Basal cell cancer - MOHS surgery   HERNIA REPAIR  2001   HIP SURGERY     JOINT REPLACEMENT  06/2016   Total hip replacement - left hip   PENILE PROSTHESIS IMPLANT  2016   POLYPECTOMY  01/17/2023   Procedure: POLYPECTOMY;  Surgeon: Iva Boop, MD;  Location: [facility];  Service: Gastroenterology;;   PROSTATECTOMY  2009   SINUS SURGERY WITH INSTATRAK  2014   SQUAMOUS CELL CARCINOMA EXCISION  01/2017   nose   TOOTH EXTRACTION     TOTAL HIP ARTHROPLASTY  06/2016    Family History  Problem Relation Age of Onset   Ulcerative colitis Mother    Colon polyps Mother    Asthma Mother    Arthritis Mother    Skin cancer Mother        basal cell carcinoma   Kidney failure Mother    Atrial fibrillation Mother     Kidney disease Mother    Obesity Mother    Asthma Father    Arthritis Father    Diabetes Father    Heart attack Father    Hyperlipidemia Father    Hypertension Father    Kidney disease Father    Obesity Father    Diabetes Brother    Arthritis Brother    Hyperlipidemia Brother    Brain cancer Brother        GBM   Cancer Brother    Alcohol abuse Brother    Breast cancer Maternal Grandmother    Cancer Maternal Grandmother    Diabetes Paternal Grandfather    Colon cancer Neg Hx    Esophageal cancer Neg Hx    Liver cancer Neg Hx    Pancreatic cancer Neg Hx    Rectal cancer Neg Hx    Stomach cancer Neg Hx    Crohn's disease Neg Hx     Social History   Socioeconomic History   Marital status: Married    Spouse name: Not on file   Number of children: 3   Years of education: Not on file   Highest education level: Bachelor's degree (e.g., BA, AB, BS)  Occupational History   Not on file  Tobacco Use   Smoking status: Never   Smokeless tobacco: Never  Vaping Use   Vaping status: Never Used  Substance and Sexual Activity   Alcohol use: Yes    Alcohol/week: 3.0 standard drinks of alcohol    Types: 3 Glasses of wine per week    Comment: occ wine   Drug use: No   Sexual activity: Yes    Birth control/protection: Surgical    Comment: Prostate removal  Other Topics Concern   Not on file  Social History Narrative   Former Engineer, agricultural at [employer], now Environmental health practitioner.    Married   3 daughters- all live locally   Has 7 grandchildren and 1 great grandchild   Enjoys reading, Environmental manager         Lives in a one story home with a bonus room over the garage.   Social Drivers of Corporate investment banker Strain: Low Risk  (04/16/2023)   Overall Financial Resource Strain (CARDIA)    Difficulty of Paying Living Expenses: Not very hard  Food Insecurity: No Food Insecurity (04/16/2023)   Hunger Vital Sign    Worried About Running Out of Food  in the Last  Year: Never true    Ran Out of Food in the Last Year: Never true  Transportation Needs: No Transportation Needs (04/16/2023)   PRAPARE - Administrator, Civil Service (Medical): No    Lack of Transportation (Non-Medical): No  Physical Activity: Insufficiently Active (04/16/2023)   Exercise Vital Sign    Days of Exercise per Week: 3 days    Minutes of Exercise per Session: 30 min  Stress: Stress Concern Present (04/16/2023)   [employer] of Occupational Health - Occupational Stress Questionnaire    Feeling of Stress : To some extent  Social Connections: Socially Integrated (04/16/2023)   Social Connection and Isolation Panel [NHANES]    Frequency of Communication with Friends and Family: Twice a week    Frequency of Social Gatherings with Friends and Family: Once a week    Attends Religious Services: 1 to 4 times per year    Active Member of [employer] or Organizations: Yes    Attends Engineer, structural: More than 4 times per year    Marital Status: Married  Catering manager Violence: Not At Risk (08/25/2022)   Humiliation, Afraid, Rape, and Kick questionnaire    Fear of Current or Ex-Partner: No    Emotionally Abused: No    Physically Abused: No    Sexually Abused: No    Outpatient Medications Prior to Visit  Medication Sig Dispense Refill   albuterol (VENTOLIN HFA) 108 (90 Base) MCG/ACT inhaler Inhale 2 puffs into the lungs every 6 (six) hours as needed for wheezing or shortness of breath. 18 g 5   allopurinol (ZYLOPRIM) 100 MG tablet TAKE 1 TABLET(100 MG) BY MOUTH TWICE DAILY 180 tablet 1   calcium carbonate (OS-CAL - DOSED IN MG OF ELEMENTAL CALCIUM) 1250 (500 Ca) MG tablet Take 1 tablet by mouth daily.     Cholecalciferol (VITAMIN D3) 5000 units CAPS Take by mouth daily.     docusate sodium (COLACE) 100 MG capsule Take 200 mg by mouth daily.      EPINEPHrine 0.3 mg/0.3 mL IJ SOAJ injection Use as directed for severe allergic reactions 2 each 2    fexofenadine (ALLEGRA) 180 MG tablet Take 180 mg by mouth daily.      fluticasone (FLONASE) 50 MCG/ACT nasal spray SPRAY 2 SPRAYS INTO EACH NOSTRIL EVERY DAY 48 mL 1   fluticasone-salmeterol (WIXELA INHUB) 500-50 MCG/ACT AEPB INHALE 1 PUFF INTO THE LUNGS 2 (TWO) TIMES DAILY. IN THE MORNING AND AT BEDTIME. 60 each 2   hydrALAZINE (APRESOLINE) 25 MG tablet Take 1 tablet (25 mg total) by mouth in the morning and at bedtime. 180 tablet 1   ipratropium-albuterol (DUONEB) 0.5-2.5 (3) MG/3ML SOLN 1 vial in nebulizer every 4-6 hours while awake for the next 3-5 days for coughing or wheezing. 360 mL 1   levothyroxine (SYNTHROID) 75 MCG tablet Take 1 tablet (75 mcg total) by mouth daily before breakfast. 90 tablet 1   Magnesium 250 MG TABS Take by mouth daily.     montelukast (SINGULAIR) 10 MG tablet Take one tablet once at night for coughing or wheezing 90 tablet 3   tiZANidine (ZANAFLEX) 2 MG tablet TAKE 1 TO 2 TABLETS(2 TO 4 MG) BY MOUTH AT BEDTIME AS NEEDED FOR MUSCLE SPASMS 60 tablet 1   amLODipine (NORVASC) 5 MG tablet Take 1 tablet (5 mg total) by mouth 2 (two) times daily. 180 tablet 1   predniSONE (DELTASONE) 10 MG tablet Take 4 tabs daily x 3  days, 3 tabs daily x 3 days, 2 tabs daily x 3 days, 1 tab daily x 3 days (keep on hand for asthma exacerbation) 30 tablet 0   No facility-administered medications prior to visit.    Allergies  Allergen Reactions   Baclofen Shortness Of Breath    Asthma exacerbation   Colchicine Other (See Comments)    Pancreatitis   Doxycycline Hives   Statins Other (See Comments)    Joint stiffness   Avelox [Moxifloxacin Hcl In Nacl] Other (See Comments)    SEIZURE   Bactrim [Sulfamethoxazole-Trimethoprim] Hives   Cefdinir Hives   Gabapentin     Eye twitch and difficult focusing his vision   Losartan     Rash    Oregano [Origanum Oil] Cough   Peanut-Containing Drug Products Swelling    Throat swelling   Adhesive  [Tape] Other (See Comments)    "tears skin"    Penicillins Rash    ROS     Objective:    Physical Exam Constitutional:      General: He is not in acute distress.    Appearance: He is well-developed.  HENT:     Head: Normocephalic and atraumatic.  Cardiovascular:     Rate and Rhythm: Normal rate and regular rhythm.     Heart sounds: No murmur heard. Pulmonary:     Effort: Pulmonary effort is normal. No respiratory distress.     Breath sounds: Normal breath sounds. Decreased air movement (to bases) present. No wheezing or rales.  Musculoskeletal:        General: Swelling (RLE 2+ , LLE 3+) present.  Skin:    General: Skin is warm and dry.     Comments: Erythema surrounding left shin wound from basal cell excision  Neurological:     Mental Status: He is alert and oriented to person, place, and time.  Psychiatric:        Behavior: Behavior normal.        Thought Content: Thought content normal.       BP 139/76 (BP Location: Right Arm, Patient Position: Sitting, Cuff Size: Large)   Pulse (!) 56   Temp 98.7 F (37.1 C) (Oral)   Resp 16   Ht 5\' 6"  (1.676 m)   Wt 236 lb (107 kg)   SpO2 97%   BMI 38.09 kg/m  Wt Readings from Last 3 Encounters:  04/17/23 236 lb (107 kg)  03/13/23 234 lb 6.4 oz (106.3 kg)  03/03/23 237 lb (107.5 kg)       Assessment & Plan:   Problem List Items Addressed This Visit       Unprioritized   Hypertension (Chronic)   Bp fair today.   Blood pressure readings variable at home. -Reduce Amlodipine to 5mg  once daily. -Start Lisinopril 10mg .  -Return in 2 weeks to assess blood pressure and repeat labs.       Relevant Medications   lisinopril (ZESTRIL) 10 MG tablet   amLODipine (NORVASC) 5 MG tablet   Cellulitis of left lower extremity - Primary   New. Mild. Multiple drug allergies limit antibiotic choices. Start Clindamycin. If redness worsens or enlarges, patient to call office.       Bilateral lower extremity edema   Suspect primary cause is amlodipine.  Will decrease  amlodipine from 5mg  bid to 5mg  once daily and adjust bp medications as above.       I have discontinued Trevaughn Mullane. Pilant "Martin"'s predniSONE. I have also changed his amLODipine. Additionally, I am  having him start on clindamycin and lisinopril. Lastly, I am having him maintain his Vitamin D3, docusate sodium, fexofenadine, Magnesium, calcium carbonate, ipratropium-albuterol, tiZANidine, montelukast, EPINEPHrine, allopurinol, fluticasone, hydrALAZINE, levothyroxine, Wixela Inhub, and albuterol.  Meds ordered this encounter  Medications   clindamycin (CLEOCIN) 300 MG capsule    Sig: Take 1 capsule (300 mg total) by mouth 3 (three) times daily for 7 days.    Dispense:  21 capsule    Refill:  0    Supervising Provider:   Danise Edge A [[id number]]   lisinopril (ZESTRIL) 10 MG tablet    Sig: Take 1 tablet (10 mg total) by mouth daily.    Dispense:  90 tablet    Refill:  0    Supervising Provider:   Danise Edge A [[id number]]   amLODipine (NORVASC) 5 MG tablet    Sig: Take 1 tablet (5 mg total) by mouth daily.    Supervising Provider:   Danise Edge A [phone]

## 2023-04-17 NOTE — Assessment & Plan Note (Signed)
Suspect primary cause is amlodipine.  Will decrease amlodipine from 5mg  bid to 5mg  once daily and adjust bp medications as above.

## 2023-04-17 NOTE — Assessment & Plan Note (Signed)
New. Mild. Multiple drug allergies limit antibiotic choices. Start Clindamycin. If redness worsens or enlarges, patient to call office.

## 2023-05-03 ENCOUNTER — Other Ambulatory Visit (HOSPITAL_BASED_OUTPATIENT_CLINIC_OR_DEPARTMENT_OTHER): Payer: Self-pay | Admitting: Pulmonary Disease

## 2023-05-03 NOTE — Telephone Encounter (Signed)
 Pt is requesting refill. Pt was last seen on 03-13-23, and the lov does not state information about rx. Please advise.

## 2023-05-04 ENCOUNTER — Ambulatory Visit: Payer: PPO | Admitting: Primary Care

## 2023-05-05 ENCOUNTER — Ambulatory Visit: Payer: PPO | Admitting: Family

## 2023-05-06 ENCOUNTER — Other Ambulatory Visit: Payer: Self-pay | Admitting: Family

## 2023-05-06 DIAGNOSIS — M109 Gout, unspecified: Secondary | ICD-10-CM

## 2023-05-10 ENCOUNTER — Encounter: Payer: Self-pay | Admitting: Internal Medicine

## 2023-05-10 ENCOUNTER — Ambulatory Visit: Payer: PPO | Admitting: Internal Medicine

## 2023-05-10 ENCOUNTER — Ambulatory Visit: Payer: PPO | Admitting: Family

## 2023-05-10 VITALS — BP 130/74 | HR 66 | Temp 98.7°F | Resp 16 | Ht 66.0 in | Wt 234.0 lb

## 2023-05-10 VITALS — BP 120/80 | HR 62 | Temp 97.3°F | Resp 16 | Ht 66.0 in | Wt 234.4 lb

## 2023-05-10 DIAGNOSIS — M5412 Radiculopathy, cervical region: Secondary | ICD-10-CM | POA: Insufficient documentation

## 2023-05-10 DIAGNOSIS — T7800XA Anaphylactic reaction due to unspecified food, initial encounter: Secondary | ICD-10-CM

## 2023-05-10 DIAGNOSIS — Z881 Allergy status to other antibiotic agents status: Secondary | ICD-10-CM | POA: Diagnosis not present

## 2023-05-10 DIAGNOSIS — J4550 Severe persistent asthma, uncomplicated: Secondary | ICD-10-CM

## 2023-05-10 DIAGNOSIS — E039 Hypothyroidism, unspecified: Secondary | ICD-10-CM

## 2023-05-10 DIAGNOSIS — J3089 Other allergic rhinitis: Secondary | ICD-10-CM

## 2023-05-10 DIAGNOSIS — L03116 Cellulitis of left lower limb: Secondary | ICD-10-CM

## 2023-05-10 DIAGNOSIS — M1A9XX0 Chronic gout, unspecified, without tophus (tophi): Secondary | ICD-10-CM | POA: Diagnosis not present

## 2023-05-10 DIAGNOSIS — J302 Other seasonal allergic rhinitis: Secondary | ICD-10-CM

## 2023-05-10 DIAGNOSIS — G2581 Restless legs syndrome: Secondary | ICD-10-CM | POA: Diagnosis not present

## 2023-05-10 DIAGNOSIS — E782 Mixed hyperlipidemia: Secondary | ICD-10-CM

## 2023-05-10 DIAGNOSIS — R739 Hyperglycemia, unspecified: Secondary | ICD-10-CM

## 2023-05-10 DIAGNOSIS — J330 Polyp of nasal cavity: Secondary | ICD-10-CM

## 2023-05-10 DIAGNOSIS — I10 Essential (primary) hypertension: Secondary | ICD-10-CM | POA: Diagnosis not present

## 2023-05-10 DIAGNOSIS — Z8739 Personal history of other diseases of the musculoskeletal system and connective tissue: Secondary | ICD-10-CM | POA: Diagnosis not present

## 2023-05-10 DIAGNOSIS — T7800XD Anaphylactic reaction due to unspecified food, subsequent encounter: Secondary | ICD-10-CM

## 2023-05-10 LAB — COMPREHENSIVE METABOLIC PANEL
ALT: 25 U/L (ref 0–53)
AST: 16 U/L (ref 0–37)
Albumin: 4.5 g/dL (ref 3.5–5.2)
Alkaline Phosphatase: 48 U/L (ref 39–117)
BUN: 18 mg/dL (ref 6–23)
CO2: 29 meq/L (ref 19–32)
Calcium: 9.4 mg/dL (ref 8.4–10.5)
Chloride: 104 meq/L (ref 96–112)
Creatinine, Ser: 1.16 mg/dL (ref 0.40–1.50)
GFR: 63.6 mL/min (ref >60.00)
Glucose, Bld: 102 mg/dL — ABNORMAL HIGH (ref 70–99)
Potassium: 4.5 meq/L (ref 3.5–5.1)
Sodium: 138 meq/L (ref 135–145)
Total Bilirubin: 0.6 mg/dL (ref 0.2–1.2)
Total Protein: 6.6 g/dL (ref 6.0–8.3)

## 2023-05-10 LAB — LIPID PANEL
Cholesterol: 180 mg/dL (ref 0–200)
HDL: 41.1 mg/dL (ref >39.00)
LDL Cholesterol: 101 mg/dL — ABNORMAL HIGH (ref 0–99)
NonHDL: 138.69
Total CHOL/HDL Ratio: 4
Triglycerides: 186 mg/dL — ABNORMAL HIGH (ref 0.0–149.0)
VLDL: 37.2 mg/dL (ref 0.0–40.0)

## 2023-05-10 LAB — HEMOGLOBIN A1C: Hgb A1c MFr Bld: 5.4 % (ref 4.6–6.5)

## 2023-05-10 MED ORDER — CLINDAMYCIN HCL 300 MG PO CAPS: 300.0000 mg | ORAL_CAPSULE | Freq: Three times a day (TID) | ORAL | 0 refills | Status: DC

## 2023-05-10 MED ORDER — GABAPENTIN 300 MG PO CAPS: 300.0000 mg | ORAL_CAPSULE | Freq: Every day | ORAL | 1 refills | Status: DC

## 2023-05-10 NOTE — Assessment & Plan Note (Addendum)
 Could not tolerate requip .  Did find some relief with gabapentin  but felt it gave him an eye twitch.  He would like to retrial gabapentin  HS.

## 2023-05-10 NOTE — Patient Instructions (Signed)
 VISIT SUMMARY:  Today, we addressed several of your health concerns, including severe neck pain, restless leg syndrome, a leg infection, high blood pressure, asthma, and gout. We discussed your symptoms, reviewed your current medications, and made adjustments to your treatment plan to better manage your conditions.  YOUR PLAN:  -DEGENERATIVE DISC DISEASE: Degenerative disc disease is a condition where the discs between the vertebrae of the spine break down, causing pain. Your severe neck pain is due to this condition affecting the C5 region. We discussed the potential benefit of using steroids during severe episodes. An MRI of your neck has been ordered to better assess the severity, and you will be referred to neurosurgery once the MRI results are available.  -RESTLESS LEG SYNDROME: Restless leg syndrome is a condition that causes an uncontrollable urge to move your legs, usually due to discomfort. Your symptoms are not well controlled, so we will resume Gabapentin  300mg  at night and monitor for any side effects.  -CELLULITIS: Cellulitis is a bacterial skin infection. You have a recurrent infection on your leg that has improved with a recent course of Clindamycin . We will repeat the course of Clindamycin  and request that you send a picture after completing the antibiotic course.  -HYPERTENSION: Hypertension is high blood pressure. Your blood pressure is well controlled with your current medications, Lisinopril  and Amlodipine . Continue with your current regimen.  -ASTHMA: Asthma is a condition where your airways narrow and swell, making it difficult to breathe. Your asthma is stable and currently managed with Wixela. Continue with your current regimen.  -GOUT: Gout is a form of arthritis characterized by severe pain, redness, and tenderness in joints. You have not had a recent flare-up and are managing it with increased water intake and dietary modifications. Continue with your current management  strategy.  -HYPERLIPIDEMIA: Hyperlipidemia is having high levels of lipids (fats) in your blood. Your last cholesterol level was elevated, so we have ordered a lipid panel to check your current levels.  -HYPERGLYCEMIA: Hyperglycemia is high blood sugar. Your last glucose level was elevated, possibly due to recent steroid use. We have ordered an HbA1c test to assess your blood sugar control over the past 3 months.  INSTRUCTIONS:  Please follow up with the results of your MRI and blood tests. Send a picture of your leg after completing the antibiotic course for cellulitis. Continue with your current medications for high blood pressure, asthma, and gout. Monitor for any side effects from Gabapentin  and report them to us . We will adjust your management plan as necessary based on the test results.

## 2023-05-10 NOTE — Assessment & Plan Note (Signed)
 Lab Results  Component Value Date   TSH 3.51 01/24/2023   Stable on synthroid , continue same.

## 2023-05-10 NOTE — Assessment & Plan Note (Signed)
 Some improvement. Not resolved.  Repeat clindamycin .  Pt to send update pic in 1 week.

## 2023-05-10 NOTE — Assessment & Plan Note (Signed)
 Stable currently on allopurinol .  Monitors diet.

## 2023-05-10 NOTE — Assessment & Plan Note (Signed)
 Reports asthma has been stable.  Continues Wixella.

## 2023-05-10 NOTE — Assessment & Plan Note (Addendum)
 BP Readings from Last 3 Encounters:  05/10/23 130/74  04/17/23 139/76  03/13/23 (!) 144/75  At goal on lisinopril  and amlodipine  and hydralazin continue same.

## 2023-05-10 NOTE — Progress Notes (Signed)
 Subjective:     Patient ID: Michael [name], male    DOB: Apr 23, 1951, 73 y.o.   MRN: [id number]  Chief Complaint  Patient presents with   Hypertension    Here for follow up    HPI  Discussed the use of AI scribe software for clinical note transcription with the patient, who gave verbal consent to proceed.  History of Present Illness   Michael [name], a 73 year old patient with a history of degenerative disc disease, gout, high blood pressure, restless leg syndrome, and asthma, presents with multiple complaints. The most pressing issue is a recent episode of severe neck pain, which the patient describes as being due to degenerative disc disease in the C5 region. This pain radiates under the left shoulder blade and into the back of the left arm. The patient reports that the pain comes and goes, and during a recent episode, it was so severe that it caused a spike in blood pressure. The patient attempted to alleviate the pain by changing pillows, as recommended by physical therapy and ultimately found a pillow that helped. The patient also reports having a leg infection, which has improved but is still causing significant pain.  In addition to these issues, the patient's high blood pressure seems to be more controlled with the use of lisinopril . The patient's restless leg syndrome, however, is not well controlled and is particularly bothersome at night. The patient's asthma has been stable, and the patient has not needed to use a nebulizer or any other extraordinary measures. The patient also reports having a history of gout. He continues allopurinol . Had a mild flare at  Christmas which he treated with dietary modification.       Health Maintenance Due  Topic Date Due   COVID-19 Vaccine (7 - 2024-25 season) 12/25/2022    Past Medical History:  Diagnosis Date   Allergy     Arthritis    Asthma    Blood transfusion without reported diagnosis    "as a child"   Cancer (HCC)    basal cell  and squamous cell carcinoma   Cancer (HCC)    prostate   Cataract 2023   Clotting disorder (HCC)    Colon polyps    COPD (chronic obstructive pulmonary disease) (HCC) 1975   Difficult airway for intubation 09/28/2022   2015 at [facility]   GERD (gastroesophageal reflux disease)    Gout    High cholesterol    History of hepatitis    due to drug interaction?   History of pancreatitis 2016   ?due to colchicine   History of pulmonary embolus (PE) 2016   History of stomach ulcers    Hypertension    Hypothyroidism    Lung nodule    Neuromuscular disorder (HCC)    restless leg   OSA (obstructive sleep apnea) 2015   has CPAP   Osteopenia    Pneumonia    Seizures (HCC)    x1 10 years ago   Sleep apnea    Thyroid  disease    hypothyroid   Ulcer 1985   Urinary incontinence     Past Surgical History:  Procedure Laterality Date   BASAL CELL CARCINOMA EXCISION  2017   MOHS.  beneath right eye   BRAIN SURGERY  1967   Fractured skull in an accident   CHOLECYSTECTOMY  2001   COLON SURGERY  2001   Complication of gall bladder surgery   COLONOSCOPY     multiple - last 06/2017   COLONOSCOPY  WITH PROPOFOL  N/A 01/17/2023   Procedure: COLONOSCOPY WITH PROPOFOL ;  Surgeon: Kenney Peacemaker, MD;  Location: [facility];  Service: Gastroenterology;  Laterality: N/A;   COSMETIC SURGERY  2017   Basal cell cancer - MOHS surgery   HERNIA REPAIR  2001   HIP SURGERY     JOINT REPLACEMENT  06/2016   Total hip replacement - left hip   PENILE PROSTHESIS IMPLANT  2016   POLYPECTOMY  01/17/2023   Procedure: POLYPECTOMY;  Surgeon: Kenney Peacemaker, MD;  Location: [facility];  Service: Gastroenterology;;   PROSTATECTOMY  2009   SINOSCOPY     SINUS SURGERY WITH INSTATRAK  2014   SQUAMOUS CELL CARCINOMA EXCISION  01/2017   nose   TOOTH EXTRACTION     TOTAL HIP ARTHROPLASTY  06/2016    Family History  Problem Relation Age of Onset   Ulcerative colitis Mother    Colon polyps Mother    Asthma  Mother    Arthritis Mother    Skin cancer Mother        basal cell carcinoma   Kidney failure Mother    Atrial fibrillation Mother    Kidney disease Mother    Obesity Mother    Asthma Father    Arthritis Father    Diabetes Father    Heart attack Father    Hyperlipidemia Father    Hypertension Father    Kidney disease Father    Obesity Father    Diabetes Brother    Arthritis Brother    Hyperlipidemia Brother    Brain cancer Brother        GBM   Cancer Brother    Alcohol abuse Brother    Breast cancer Maternal Grandmother    Cancer Maternal Grandmother    Diabetes Paternal Grandfather    Colon cancer Neg Hx    Esophageal cancer Neg Hx    Liver cancer Neg Hx    Pancreatic cancer Neg Hx    Rectal cancer Neg Hx    Stomach cancer Neg Hx    Crohn's disease Neg Hx     Social History   Socioeconomic History   Marital status: Married    Spouse name: Not on file   Number of children: 3   Years of education: Not on file   Highest education level: Bachelor's degree (e.g., BA, AB, BS)  Occupational History   Not on file  Tobacco Use   Smoking status: Never    Passive exposure: Never   Smokeless tobacco: Never  Vaping Use   Vaping status: Never Used  Substance and Sexual Activity   Alcohol use: Yes    Alcohol/week: 3.0 standard drinks of alcohol    Types: 3 Glasses of wine per week    Comment: occ wine   Drug use: No   Sexual activity: Yes    Birth control/protection: Surgical    Comment: Prostate removal  Other Topics Concern   Not on file  Social History Narrative   Former Engineer, agricultural at [employer], now Environmental health practitioner.    Married   3 daughters- all live locally   Has 7 grandchildren and 1 great grandchild   Enjoys reading, Environmental manager         Lives in a one story home with a bonus room over the garage.   Social Drivers of Health   Financial Resource Strain: Low Risk  (04/16/2023)   Overall Financial Resource Strain (CARDIA)     Difficulty of Paying Living Expenses:  Not very hard  Food Insecurity: No Food Insecurity (04/16/2023)   Hunger Vital Sign    Worried About Running Out of Food in the Last Year: Never true    Ran Out of Food in the Last Year: Never true  Transportation Needs: No Transportation Needs (04/16/2023)   PRAPARE - Administrator, Civil Service (Medical): No    Lack of Transportation (Non-Medical): No  Physical Activity: Insufficiently Active (04/16/2023)   Exercise Vital Sign    Days of Exercise per Week: 3 days    Minutes of Exercise per Session: 30 min  Stress: Stress Concern Present (04/16/2023)   [employer] of Occupational Health - Occupational Stress Questionnaire    Feeling of Stress : To some extent  Social Connections: Socially Integrated (04/16/2023)   Social Connection and Isolation Panel [NHANES]    Frequency of Communication with Friends and Family: Twice a week    Frequency of Social Gatherings with Friends and Family: Once a week    Attends Religious Services: 1 to 4 times per year    Active Member of [employer] or Organizations: Yes    Attends Engineer, structural: More than 4 times per year    Marital Status: Married  Catering manager Violence: Not At Risk (08/25/2022)   Humiliation, Afraid, Rape, and Kick questionnaire    Fear of Current or Ex-Partner: No    Emotionally Abused: No    Physically Abused: No    Sexually Abused: No    Outpatient Medications Prior to Visit  Medication Sig Dispense Refill   albuterol  (VENTOLIN  HFA) 108 (90 Base) MCG/ACT inhaler Inhale 2 puffs into the lungs every 6 (six) hours as needed for wheezing or shortness of breath. 18 g 5   allopurinol  (ZYLOPRIM ) 100 MG tablet TAKE 1 TABLET BY MOUTH TWICE A DAY 180 tablet 1   amLODipine  (NORVASC ) 5 MG tablet Take 1 tablet (5 mg total) by mouth daily.     calcium carbonate (OS-CAL - DOSED IN MG OF ELEMENTAL CALCIUM) 1250 (500 Ca) MG tablet Take 1 tablet by mouth daily.     docusate  sodium (COLACE) 100 MG capsule Take 200 mg by mouth daily.      EPINEPHrine  0.3 mg/0.3 mL IJ SOAJ injection Use as directed for severe allergic reactions 2 each 2   fexofenadine (ALLEGRA) 180 MG tablet Take 180 mg by mouth daily.      fluticasone  (FLONASE ) 50 MCG/ACT nasal spray SPRAY 2 SPRAYS INTO EACH NOSTRIL EVERY DAY 48 mL 1   fluticasone -salmeterol (WIXELA INHUB) 500-50 MCG/ACT AEPB INHALE 1 PUFF INTO THE LUNGS 2 (TWO) TIMES DAILY. IN THE MORNING AND AT BEDTIME. 60 each 11   hydrALAZINE  (APRESOLINE ) 25 MG tablet Take 1 tablet (25 mg total) by mouth in the morning and at bedtime. 180 tablet 1   ipratropium-albuterol  (DUONEB) 0.5-2.5 (3) MG/3ML SOLN 1 vial in nebulizer every 4-6 hours while awake for the next 3-5 days for coughing or wheezing. 360 mL 1   levothyroxine  (SYNTHROID ) 75 MCG tablet Take 1 tablet (75 mcg total) by mouth daily before breakfast. 90 tablet 1   lisinopril  (ZESTRIL ) 10 MG tablet Take 1 tablet (10 mg total) by mouth daily. 90 tablet 0   Magnesium 250 MG TABS Take by mouth daily.     montelukast  (SINGULAIR ) 10 MG tablet Take one tablet once at night for coughing or wheezing 90 tablet 3   tiZANidine  (ZANAFLEX ) 2 MG tablet TAKE 1 TO 2 TABLETS(2 TO 4 MG) BY  MOUTH AT BEDTIME AS NEEDED FOR MUSCLE SPASMS 60 tablet 1   Cholecalciferol (VITAMIN D3) 5000 units CAPS Take by mouth daily.     No facility-administered medications prior to visit.    Allergies  Allergen Reactions   Baclofen  Shortness Of Breath    Asthma exacerbation   Colchicine Other (See Comments)    Pancreatitis   Doxycycline Hives   Statins Other (See Comments)    Joint stiffness   Avelox [Moxifloxacin Hcl In Nacl] Other (See Comments)    SEIZURE   Bactrim  [Sulfamethoxazole -Trimethoprim ] Hives   Cefdinir  Hives   Losartan      Rash    Oregano [Origanum Oil] Cough   Peanut -Containing Drug Products Swelling    Throat swelling   Adhesive  [Tape] Other (See Comments)    "tears skin"   Penicillins Rash     ROS    See HPI Objective:    Physical Exam Constitutional:      General: He is not in acute distress.    Appearance: He is well-developed.  HENT:     Head: Normocephalic and atraumatic.  Cardiovascular:     Rate and Rhythm: Normal rate and regular rhythm.     Heart sounds: No murmur heard. Pulmonary:     Effort: Pulmonary effort is normal. No respiratory distress.     Breath sounds: Normal breath sounds. No wheezing or rales.  Skin:    General: Skin is warm and dry.     Comments: Scabbed lesion left shin with some surrounding erythema  Neurological:     Mental Status: He is alert and oriented to person, place, and time.  Psychiatric:        Behavior: Behavior normal.        Thought Content: Thought content normal.       BP 130/74   Pulse 66   Temp 98.7 F (37.1 C) (Oral)   Resp 16   Ht 5\' 6"  (1.676 m)   Wt 234 lb (106.1 kg)   SpO2 96%   BMI 37.77 kg/m  Wt Readings from Last 3 Encounters:  05/10/23 234 lb 6.4 oz (106.3 kg)  05/10/23 234 lb 6.4 oz (106.3 kg)  05/10/23 234 lb (106.1 kg)       Assessment & Plan:   Problem List Items Addressed This Visit       Unprioritized   Hypertension (Chronic)   BP Readings from Last 3 Encounters:  05/10/23 130/74  04/17/23 139/76  03/13/23 (!) 144/75  At goal on lisinopril  and amlodipine  and hydralazin continue same.        Relevant Orders   Comp Met (CMET)   Severe persistent asthma   Reports asthma has been stable.  Continues Wixella.        RLS (restless legs syndrome)   Could not tolerate requip .  Did find some relief with gabapentin  but felt it gave him an eye twitch.  He would like to retrial gabapentin  HS.       Hypothyroid   Lab Results  Component Value Date   TSH 3.51 01/24/2023   Stable on synthroid , continue same.       Hyperlipidemia   Lab Results  Component Value Date   CHOL 188 07/14/2021   HDL 39.90 07/14/2021   LDLCALC 121 (H) 07/14/2021   TRIG 138.0 07/14/2021   CHOLHDL 5  07/14/2021   Update lipid.       Relevant Orders   Lipid panel   Gout   Stable currently on allopurinol .  Monitors diet.        Cervical radiculopathy - Primary    Severe pain due to C5 nerve impingement. Recent exacerbation managed with change in pillow and Tramadol. Discussed potential benefit of steroids during severe episodes. -Did not have much improvement with PT.  Noted increased pain after PT.   -Consider steroids for future severe episodes. -Order MRI of the neck to better assess the severity of the degenerative changes. -Refer to neurosurgery once MRI results are available.      Relevant Orders   MR CERVICAL SPINE WO CONTRAST   Cellulitis of left lower extremity   Some improvement. Not resolved.  Repeat clindamycin .  Pt to send update pic in 1 week.       Other Visit Diagnoses       Hyperglycemia       Relevant Orders   HgB A1c       I am having Clyde Darling. Chait "Michael [name]" maintain his docusate sodium, fexofenadine, Magnesium, calcium carbonate, ipratropium-albuterol , tiZANidine , montelukast , EPINEPHrine , fluticasone , hydrALAZINE , levothyroxine , albuterol , lisinopril , amLODipine , Wixela Inhub, and allopurinol .  Meds ordered this encounter  Medications   DISCONTD: clindamycin  (CLEOCIN ) 300 MG capsule    Sig: Take 1 capsule (300 mg total) by mouth 3 (three) times daily.    Dispense:  21 capsule    Refill:  0    Supervising Provider:   Randie Bustle A [[id number]]   DISCONTD: gabapentin  (NEURONTIN ) 300 MG capsule    Sig: Take 1 capsule (300 mg total) by mouth at bedtime.    Dispense:  30 capsule    Refill:  1    Supervising Provider:   Randie Bustle A [[id number]]

## 2023-05-10 NOTE — Assessment & Plan Note (Signed)
 Lab Results  Component Value Date   CHOL 188 07/14/2021   HDL 39.90 07/14/2021   LDLCALC 121 (H) 07/14/2021   TRIG 138.0 07/14/2021   CHOLHDL 5 07/14/2021   Update lipid.

## 2023-05-10 NOTE — Patient Instructions (Addendum)
 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 Currently on Wixela and Montelukast  with good adherence. No recent use of rescue inhaler or nebulizer. History of successful treatment with Dupixent , discontinued due to cost. Breathing tests; showed some inflammation in your lungs that did not approve with albuterol   -Continue current regimen of Wixela 1 puff twice daily, Montelukast .10mg  at night  - Rescue Inhaler:  Duonebs  . Use  every 4-6 hours as needed for chest tightness, wheezing, or coughing.  Can also use 15 minutes prior to exercise if you have symptoms with activity. - Asthma is not controlled if:  - Symptoms are occurring >2 times a week OR  - >2 times a month nighttime awakenings  - You are requiring systemic steroids (prednisone /steroid injections) more than once per year  - Your require hospitalization for your asthma.  - Please call the clinic to schedule a follow up if these symptoms arise  -Check with Tammy regarding current copays for biologics, specifically Dupixent , to consider reinitiating therapy.  Drug Allergies Multiple reported drug allergies and adverse reactions, including penicillin, baclofen , Avelox, Bactrim , cefdinir , and doxycycline. Patient expresses desire for retesting to expand antibiotic options. -Schedule for penicillin skin testing and food allergy  testing after ensuring patient has been off antihistamines for the appropriate duration. -If penicillin skin test is negative, schedule for oral penicillin challenge. -Consider future drug challenges for other reported allergies, prioritizing penicillins and cephalosporins.  Allergic Rhinitis with Nasal Polyps Chronic symptoms despite use of Flonase . History of nasal polyp removal. Previous successful treatment with Dupixent . -Continue Flonase .1 spray per nostril twice daily  -Continue Allegra  180mg  daily  -Consider reinitiating Dupixent  if cost can be managed, as it may also benefit nasal polyps.  Food Allergy : Peanut , tree nut, oregano  Patient reports rotation of symptoms every seven years and currently in an "off cycle". Experiences coughing and wheezing with exposure. -Include peanut  in scheduled food allergy  testing. -If skin test is negative or moderately positive, order blood work and consider food challenge if results suggest >50% chance of tolerance.   Reflux  - Continue dietary and lifestyle modifications   Follow up: for skin testing (avoid all antihistamines for 3 days   Thank you so much for letting me partake in your care today.  Don't hesitate to reach out if you have any additional concerns!  Orelia Binet, MD  [facility] [place], [place]

## 2023-05-10 NOTE — Assessment & Plan Note (Addendum)
  Severe pain due to C5 nerve impingement. Recent exacerbation managed with change in pillow and Tramadol. Discussed potential benefit of steroids during severe episodes. -Did not have much improvement with PT.  Noted increased pain after PT.   -Consider steroids for future severe episodes. -Order MRI of the neck to better assess the severity of the degenerative changes. -Refer to neurosurgery once MRI results are available.

## 2023-05-10 NOTE — Progress Notes (Signed)
 FOLLOW UP Date of Service/Encounter:  05/10/23  Subjective:  Michael [name] (DOB: 1951-02-07) is a 73 y.o. male who [facility] in re-evaluation of the following: astha, rhinitis with nasal polyps, food allergy , drug allergies, GERD  History obtained from: chart review and patient.  For Review, LV was on 02/10/21  with Marinus Sic, FNP seen for acute visit for asthma flare  . See below for summary of history and diagnostics.   Therapeutic plans/changes recommended: treated for asthma exacerbation with prednisone , switched to airduo due to cost  ----------------------------------------------------- Pertinent History/Diagnostics:  Asthma: severe persistent, significant prednisone  use over lifetime with avascular necrosis of left hip in 2018; flares in fall improves in winter  Previously on xolair, dupixent  (stopped in 2021 due to cost, but excellent response)  Previously on dulera , airduo, encruse  Currently on montelukast , wixela  - normal  spirometry (02/05/17): ratio 69, 2.27L FEV1 (pre), + 2.50L FEV1 (post) - IgE (08/17/21): 159; AEC 400 Allergic Rhinitis: with nasal polyposis s/p 2 polypectomies Previously on dupixent  with good response Food Allergy :  Peanut , tree nut, oregano  H/O Drug Allergies :  Urticaria: penicillin, cefdinir , doxycyline, bactrim   Delayed Maculopapular rash: clindamycin , cefidinir  Seizures: avelox   --------------------------------------------------- Today presents for follow-up. Discussed the use of AI scribe software for clinical note transcription with the patient, who gave verbal consent to proceed.  The patient, Michael [name], with a long-standing history of asthma, presents for a follow-up visit. Over the past year, the patient's asthma control has been suboptimal, necessitating four courses of systemic steroids. Despite this, the patient did not require any emergency department visits or  hospitalizations. The patient's current asthma regimen includes Wixela 500mcg (one puff twice daily) and Montelukast  at night. The patient reports good adherence to this regimen. The patient's asthma symptoms, including cough, wheeze, and shortness of breath, are reportedly worse in October but have been well-controlled over the winter months. The patient denies any recent use of a nebulizer or frequent use of a rescue inhaler.  The patient has a history of hip replacement six years ago due to avascular necrosis, which he attributes to long-term steroid use for asthma. The patient had previously been on Dupixent  for two years, which he found very effective in controlling his asthma symptoms. However, the medication was discontinued due to cost concerns. The patient expresses interest in reevaluating the cost and potential for restarting Dupixent .  The patient also reports chronic upper airway symptoms, including a runny and stuffy nose, which he manages with Flonase . He has a history of nasal polyps, which have been surgically removed twice. The patient denies any changes in his sense of smell or taste since discontinuing Dupixent .  The patient has a history of multiple drug allergies, including penicillin, which he has avoided for approximately 25 years. He expresses a desire to be retested for penicillin allergy  due to increasing difficulty in finding tolerable antibiotics for infections. The patient also reports a severe reaction to Avelox, which resulted in seizures and an emergency department visit. He suspects this may have been due to a drug interaction. Other reported drug reactions include hives from doxycycline and cefdinir , stiffness from statins, and an asthma exacerbation from baclofen . 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   All medications reviewed by clinical staff and updated in chart. No new pertinent medical or surgical history except as  noted in HPI.  ROS: All others negative except  as noted per HPI.   Objective:  BP 120/80   Pulse 62   Temp (!) 97.3 F (36.3 C) (Temporal)   Resp 16   Ht 5\' 6"  (1.676 m)   Wt 234 lb 6.4 oz (106.3 kg)   SpO2 98%   BMI 37.83 kg/m  Body mass index is 37.83 kg/m. Physical Exam: General Appearance:  Alert, cooperative, no distress, appears stated age  Head:  Normocephalic, without obvious abnormality, atraumatic  Eyes:  Conjunctiva clear, EOM's intact  Ears EACs normal bilaterally  Nose: Nares normal,  erythematous nasal mucosa , no visible anterior polyps, and septum midline  Throat: Lips, tongue normal; teeth and gums normal, + cobblestoning  Neck: Supple, symmetrical  Lungs:   clear to auscultation bilaterally, Respirations unlabored, no coughing  Heart:  regular rate and rhythm and no murmur, Appears well perfused  Extremities: No edema  Skin: Erytehmatous patch on left arm  surrounding skin biopsy sie   Neurologic: No gross deficits   Labs:  Lab Orders  No laboratory test(s) ordered today    Spirometry:  Tracings reviewed. His effort: Good reproducible efforts. FVC: 2.39 L FEV1: 1.75 L, 63% predicted FEV1/FVC ratio: 69% Interpretation: Nonobstructive ratio, low FEV1, possible restriction.  After 4 puffs of albuterol  there was no significant postbronchodilator response Please see scanned spirometry results for details.   Assessment/Plan   Asthma: severe persistent, history of avascular necrosis from chronic steroid use  Stable currently, but history of multiple exacerbations requiring systemic steroids. Currently on Wixela and Montelukast  with good adherence. No recent use of rescue inhaler or nebulizer. History of successful treatment with Dupixent , discontinued due to cost.  Breathing tests; showed some inflammation in your lungs that did not approve with albuterol   -Continue current regimen of Wixela 1 puff twice daily, Montelukast .10mg  at night  - Rescue  Inhaler: Duonebs . Use  every 4-6 hours as needed for chest tightness, wheezing, or coughing.  Can also use 15 minutes prior to exercise if you have symptoms with activity. - Asthma is not controlled if:  - Symptoms are occurring >2 times a week OR  - >2 times a month nighttime awakenings  - You are requiring systemic steroids (prednisone /steroid injections) more than once per year  - Your require hospitalization for your asthma.  - Please call the clinic to schedule a follow up if these symptoms arise  -Check with [facility] regarding current copays for biologics, specifically Dupixent , to consider reinitiating therapy.  Drug Allergies Multiple reported drug allergies and adverse reactions, including penicillin, baclofen , Avelox, Bactrim , cefdinir , and doxycycline. Patient expresses desire for retesting to expand antibiotic options.  -Schedule for penicillin skin testing and food allergy  testing after ensuring patient has been off antihistamines for the appropriate duration. -If penicillin skin test is negative, schedule for oral penicillin challenge. -Consider future drug challenges for other reported allergies, prioritizing penicillins and cephalosporins.  Allergic Rhinitis with Nasal Polyps Chronic symptoms despite use of Flonase . History of nasal polyp removal. Previous successful treatment with Dupixent .  -Continue Flonase .1 spray per nostril twice daily  -Continue Allegra 180mg  daily  -Consider reinitiating Dupixent  if cost can be managed, as it may also benefit nasal polyps.  Food Allergy : Peanut , tree nut, oregano  Patient reports rotation of symptoms every seven years and currently in an "off cycle". Experiences coughing and wheezing with exposure. -Include peanut  in scheduled food allergy  testing. -If skin test is negative or moderately positive, order blood work and consider food challenge if results suggest >50% chance of  tolerance.   Reflux  - Continue dietary and lifestyle  modifications   Other: reviewed spirometry technique and reviewed inhaler technique  Thank you so much for letting me partake in your care today.  Don't hesitate to reach out if you have any additional concerns!  Orelia Binet, MD  [facility] [place] [place]

## 2023-05-14 ENCOUNTER — Ambulatory Visit (HOSPITAL_BASED_OUTPATIENT_CLINIC_OR_DEPARTMENT_OTHER)
Admission: RE | Admit: 2023-05-14 | Discharge: 2023-05-14 | Disposition: A | Discharge status: Home / Self Care | Payer: PPO | Source: Ambulatory Visit | Attending: Family | Admitting: Family

## 2023-05-14 DIAGNOSIS — M5412 Radiculopathy, cervical region: Secondary | ICD-10-CM | POA: Diagnosis not present

## 2023-05-14 DIAGNOSIS — M47812 Spondylosis without myelopathy or radiculopathy, cervical region: Secondary | ICD-10-CM | POA: Diagnosis not present

## 2023-05-14 DIAGNOSIS — M50221 Other cervical disc displacement at C4-C5 level: Secondary | ICD-10-CM | POA: Diagnosis not present

## 2023-05-14 DIAGNOSIS — M4802 Spinal stenosis, cervical region: Secondary | ICD-10-CM | POA: Diagnosis not present

## 2023-05-14 DIAGNOSIS — M4803 Spinal stenosis, cervicothoracic region: Secondary | ICD-10-CM | POA: Diagnosis not present

## 2023-05-16 ENCOUNTER — Ambulatory Visit: Payer: PPO | Admitting: Internal Medicine

## 2023-05-16 DIAGNOSIS — Z881 Allergy status to other antibiotic agents status: Secondary | ICD-10-CM | POA: Diagnosis not present

## 2023-05-16 DIAGNOSIS — T7800XD Anaphylactic reaction due to unspecified food, subsequent encounter: Secondary | ICD-10-CM

## 2023-05-16 DIAGNOSIS — T7800XA Anaphylactic reaction due to unspecified food, initial encounter: Secondary | ICD-10-CM | POA: Diagnosis not present

## 2023-05-16 MED ORDER — CEFDINIR 250 MG/5ML PO SUSR: 600.0000 mg | Freq: Two times a day (BID) | ORAL | 0 refills | Status: AC

## 2023-05-16 NOTE — Progress Notes (Signed)
  Date of Service/Encounter:  05/24/23  Allergy testing appointment   Initial visit on 05/10/23, seen for food allergy, asthma, drug allergies .  Please see that note for additional details.  Today reports for allergy diagnostic testing:    DIAGNOSTICS:  Skin Testing: Select foods and penicillin testing  Adequate positive and negative controls Results discussed with patient/family.  PCN G positive at 5000 u/ML (30x21) - see scanned report Allergy test negative to tree nut and peanut   Allergy testing results were read and interpreted by myself, documented by clinical staff.  Patient provided with copy of allergy testing along with avoidance measures when indicated.   Ferol Luz, MD  [facility] [place]

## 2023-05-16 NOTE — Patient Instructions (Signed)
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 Currently on Wixela and Montelukast with good adherence. No recent use of rescue inhaler or nebulizer. History of successful treatment with Dupixent, discontinued due to cost. -Continue current regimen of Wixela 1 puff twice daily, Montelukast.10mg  at night  - Rescue Inhaler:  Duonebs  . Use  every 4-6 hours as needed for chest tightness, wheezing, or coughing.  Can also use 15 minutes prior to exercise if you have symptoms with activity. - Asthma is not controlled if:  - Symptoms are occurring >2 times a week OR  - >2 times a month nighttime awakenings  - You are requiring systemic steroids (prednisone/steroid injections) more than once per year  - Your require hospitalization for your asthma.  - Please call the clinic to schedule a follow up if these symptoms arise  -Check with [facility] regarding current copays for biologics, specifically Dupixent, to consider reinitiating therapy.  Drug Allergies Multiple reported drug allergies and adverse reactions, including penicillin, baclofen, Avelox, Bactrim, cefdinir, and doxycycline. Patient expresses desire for retesting to expand antibiotic options. -Penicillin testing today positive  -Continue to avoid all penicillins -Recommended moving to cefdinir skin testing as next step, will follow below protocol with 1:1 SPT, 1:100, 1:10 ID    Allergic Rhinitis with Nasal Polyps Chronic symptoms despite use of Flonase. History of nasal polyp removal. Previous successful treatment with Dupixent. -Continue Flonase.1 spray per nostril twice daily  -Continue Allegra 180mg  daily  -Consider reinitiating Dupixent if cost can be managed, as it may also benefit nasal polyps.  Food Allergy: Peanut, tree nut, oregano  Patient reports rotation of symptoms every seven years and currently in an "off  cycle". Experiences coughing and wheezing with exposure. -Allergy test (05/16/23): negative to tree nut and peanut  - Will get blood work today to see if you ave outgrown     Reflux  - Continue dietary and lifestyle modifications   Follow up: for skin testing for cefdinir   Thank you so much for letting me partake in your care today.  Don't hesitate to reach out if you have any additional concerns!  Ferol Luz, MD  [facility] [place], [place]

## 2023-05-18 ENCOUNTER — Encounter: Payer: Self-pay | Admitting: Family

## 2023-05-19 ENCOUNTER — Ambulatory Visit: Payer: PPO | Admitting: Family

## 2023-05-19 ENCOUNTER — Encounter: Payer: Self-pay | Admitting: Family

## 2023-05-19 VITALS — BP 122/68 | HR 69 | Temp 97.2°F | Resp 16

## 2023-05-19 DIAGNOSIS — Z881 Allergy status to other antibiotic agents status: Secondary | ICD-10-CM

## 2023-05-19 DIAGNOSIS — L508 Other urticaria: Secondary | ICD-10-CM

## 2023-05-19 LAB — IGE NUT PROF. W/COMPONENT RFLX
F017-IgE Hazelnut (Filbert): <0.1 kU/L
F018-IgE Brazil Nut: <0.1 kU/L
F020-IgE Almond: <0.1 kU/L
F202-IgE Cashew Nut: <0.1 kU/L
F203-IgE Pistachio Nut: <0.1 kU/L
F256-IgE Walnut: <0.1 kU/L
Macadamia Nut, IgE: <0.1 kU/L
Peanut, IgE: 0.12 kU/L — AB
Pecan Nut IgE: <0.1 kU/L

## 2023-05-19 LAB — PEANUT COMPONENTS
F352-IgE Ara h 8: <0.1 kU/L
F422-IgE Ara h 1: <0.1 kU/L
F423-IgE Ara h 2: <0.1 kU/L
F424-IgE Ara h 3: <0.1 kU/L
F427-IgE Ara h 9: <0.1 kU/L
F447-IgE Ara h 6: <0.1 kU/L

## 2023-05-19 LAB — ALLERGEN COMPONENT COMMENTS

## 2023-05-19 NOTE — Progress Notes (Signed)
[address] [place] [address] Dept: [phone]  FOLLOW UP NOTE  Patient ID: Michael [name], male    DOB: [birth year]  Age: 73 y.o. MRN: [id number] Date of Office Visit: 05/19/2023  Assessment  Chief Complaint: Food/Drug Challenge (Cefdinir skin test only.)  HPI Michael [name] is a 73 year old male who presents today for skin prick testing to cefdinir 250 mg per 5 mL.  He was last seen on May 16, 2023 by Dr. Marlynn Perking for allergy to multiple antibiotics and allergy with anaphylaxis due to food. He denies any any new diagnosis or surgeries since his last office visit.  He has been off all antihistamines for the past 3 days.  He reports that his previous reaction with cefdinir caused hives just on his chest from what he can recall. He does not recall any concomitant cardiorespiratory or gastrointestinal symptoms.  The hives went away by the next day after he stopped taking the cefdinir.  He reports that he always has itchy skin and he has been having a low level of tightness in his chest since having to be off all antihistamines for testing.  He denies any coughing, wheezing, and shortness of breath.  He denies any gastrointestinal symptoms.   Drug Allergies:  Allergies  Allergen Reactions   Baclofen Shortness Of Breath    Asthma exacerbation   Colchicine Other (See Comments)    Pancreatitis   Doxycycline Hives   Statins Other (See Comments)    Joint stiffness   Avelox [Moxifloxacin Hcl In Nacl] Other (See Comments)    SEIZURE   Bactrim [Sulfamethoxazole-Trimethoprim] Hives   Cefdinir Hives   Losartan     Rash    Oregano [Origanum Oil] Cough   Peanut-Containing Drug Products Swelling    Throat swelling   Adhesive  [Tape] Other (See Comments)    "tears skin"   Penicillins Rash    Review of Systems: Negative except as per HPI   Physical Exam: BP 122/68 (BP Location: Right Arm, Patient Position: Sitting, Cuff Size: Large)   Pulse 69   Temp (!)  97.2 F (36.2 C) (Temporal)   Resp 16   SpO2 96%    Physical Exam Constitutional:      Appearance: Normal appearance.  HENT:     Head: Normocephalic and atraumatic.     Comments: Pharynx normal. Eyes normal. Ears normal. Nose normal    Right Ear: Tympanic membrane, ear canal and external ear normal.     Left Ear: Tympanic membrane, ear canal and external ear normal.     Nose: Nose normal.     Mouth/Throat:     Mouth: Mucous membranes are moist.     Pharynx: Oropharynx is clear.  Eyes:     Conjunctiva/sclera: Conjunctivae normal.  Cardiovascular:     Rate and Rhythm: Regular rhythm.     Heart sounds: Normal heart sounds.  Pulmonary:     Effort: Pulmonary effort is normal.     Breath sounds: Normal breath sounds.     Comments: Lungs clear to auscultation Musculoskeletal:     Cervical back: Neck supple.  Skin:    General: Skin is warm.  Neurological:     Mental Status: He is alert and oriented to person, place, and time.  Psychiatric:        Mood and Affect: Mood normal.        Behavior: Behavior normal.        Thought Content: Thought content normal.  Judgment: Judgment normal.     Diagnostics:  Skin prick testing to full strength cefdinir 250 mg/ 5 mL is negative with adequate controls.  Assessment and Plan: 1. Acute urticaria   2. Allergy to multiple antibiotics     No orders of the defined types were placed in this encounter.   Patient Instructions  Multiple drug allergies Your skin prick testing to full strength cefdinir 250 mg per 5 mL was negative with adequate controls.  Please schedule an appointment on Monday for an oral challenge to cefdinir 250 mg per 5 mL.  You will need to remain off all antihistamines until your appointment.  Return for drug challenge-cefdinir.    Thank you for the opportunity to care for this patient.  Please do not hesitate to contact me with questions.  Nehemiah Settle, FNP [facility]

## 2023-05-19 NOTE — Patient Instructions (Addendum)
Multiple drug allergies Your skin prick testing to full strength cefdinir 250 mg per 5 mL was negative with adequate controls.  Please schedule an appointment on Monday for an oral challenge to cefdinir 250 mg per 5 mL.  You will need to remain off all antihistamines until your appointment.

## 2023-05-22 ENCOUNTER — Encounter: Payer: Self-pay | Admitting: Family

## 2023-05-22 ENCOUNTER — Telehealth: Payer: Self-pay | Admitting: *Deleted

## 2023-05-22 ENCOUNTER — Ambulatory Visit: Payer: PPO | Admitting: Family

## 2023-05-22 VITALS — BP 110/70 | HR 80 | Temp 98.1°F | Resp 18

## 2023-05-22 DIAGNOSIS — J4551 Severe persistent asthma with (acute) exacerbation: Secondary | ICD-10-CM | POA: Diagnosis not present

## 2023-05-22 DIAGNOSIS — L508 Other urticaria: Secondary | ICD-10-CM

## 2023-05-22 MED ORDER — PREDNISONE 10 MG PO TABS: ORAL_TABLET | ORAL | 0 refills | Status: DC

## 2023-05-22 NOTE — Patient Instructions (Addendum)
Asthma with acute exacerbation: severe persistent, history of avascular necrosis from chronic steroid use   History of multiple exacerbations requiring systemic steroids. Currently on Wixela and Montelukast with good adherence. History of successful treatment with Dupixent, discontinued due to cost. -Start prednisone 10 mg taking 2 tablets twice a day for 3 days, then on the 4th day take 2 tablets in the morning, and on the 5th day take one tablet and stop. If your symptoms do not get better please go to the Emergency Room -Continue current regimen of Wixela 500/50 mcg 1 puff twice daily, Montelukast.10mg  at night  - Rescue Inhaler:  Duonebs  . Use  every 4-6 hours as needed for chest tightness, wheezing, or coughing.  Can also use 15 minutes prior to exercise if you have symptoms with activity. - Asthma is not controlled if:  - Symptoms are occurring >2 times a week OR  - >2 times a month nighttime awakenings  - You are requiring systemic steroids (prednisone/steroid injections) more than once per year  - Your require hospitalization for your asthma.  - Please call the clinic to schedule a follow up if these symptoms arise  -[employer] recently approved. Tammy, our biologics coordinator, will be in touch with you.  Drug Allergies Multiple reported drug allergies and adverse reactions, including penicillin, baclofen, Avelox, Bactrim, cefdinir, and doxycycline. Patient expresses desire for retesting to expand antibiotic options. -Penicillin testing on 05/16/23 positive  -Continue to avoid all penicillins -Consider cefdinir oral challenge once breathing is under better control and on a biologic   Allergic Rhinitis with Nasal Polyps Chronic symptoms despite use of Flonase. History of nasal polyp removal. Previous successful treatment with Dupixent. -Continue Flonase.1 spray per nostril twice daily  -Continue Allegra 180mg  daily   Food Allergy: Peanut, tree nut, oregano  Patient reports rotation  of symptoms every seven years and currently in an "off cycle". Experiences coughing and wheezing with exposure. -Allergy test (05/16/23): negative to tree nut and peanut     Reflux  - Continue dietary and lifestyle modifications   Chronic hives - start prednisone as above. -Consider lab work - If your symptoms re-occur, begin a journal of events that occurred for up to 6 hours before your symptoms began including foods and beverages consumed, soaps or perfumes you had contact with, and medications.   Follow up in 4 weeks with Dr. Marlynn Perking

## 2023-05-22 NOTE — Telephone Encounter (Signed)
-----   Message from Ferol Luz sent at 05/10/2023  2:58 PM EST ----- Can we look at starting this patient back on dupixent.  He stopped due to high copay a few years ago but has different insurance.  4 courses of prednisone last year and has a history of avascular necrosis due to chronic prednisone use in the past

## 2023-05-22 NOTE — Telephone Encounter (Signed)
L/m for patient to contact me to discuss MCR and affordability for [employer]

## 2023-05-22 NOTE — Progress Notes (Signed)
[address] [place] [address] Dept: [phone]  FOLLOW UP NOTE  Patient ID: Michael [name], male    DOB: 1951/03/30  Age: 73 y.o. MRN: [id number] Date of Office Visit: 05/22/2023  Assessment  Chief Complaint: Asthma  HPI Gary Gabrielsen is a 73 year old male who presents today for an oral challenge to cefdinir.  He was last seen by myself on May 19, 2023 where he had negative cefdinir full-strength skin prick testing.  He reports no new diagnoses or surgeries since his last office visit.  Asthma: He continues to take Wixela 500/50 mcg 1 puff twice a day and has albuterol and DuoNeb to use as needed via his nebulizer.  He does not feel like albuterol does much for him.  On Friday he reports he had a little bit of chest tightness, but then on Saturday he had a dry cough that took 3 rounds of his nebulizer to get under control.  He does not have a dry cough this morning, but mentions he just used his DuoNeb before coming in.  He also had wheezing last night, a lot of tightness in his chest last weekend, but none now, and a substantial amount of shortness of breath before doing his breathing treatment.  His breathing did keep him up at night this past week.  He mentions that his asthma does not do well when he has not had an antihistamine.  He denies any fevers, but reports chills and mentions that this happens when he has hives.  He was previously on Dupixent and his asthma was under better control, but he had to stop due to cost.  He feels like Dorothea Ogle was approved since his last office visit,but he has not had his first injection.  He has not made any trips to the emergency room or urgent care due to breathing problems, but thought seriously about going this weekend.  He has been on 6 rounds of steroids this past year due to his asthma.  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  He reports that his right hip is in pretty good shape.  Reviewed side  effects of prednisone.  He verbalizes understanding.  Urticaria: He reports that he has had hives off and on his whole life.  His hives essentially started after leaving our office on Friday.  He reports that they were all over, but he still had hives on the base of his neck and the top of his shoulders.  He has been off all antihistamines in preparation for today's oral challenge to cefdinir.  He is not sure what is causing his hives.   Drug Allergies:  Allergies  Allergen Reactions   Baclofen Shortness Of Breath    Asthma exacerbation   Colchicine Other (See Comments)    Pancreatitis   Doxycycline Hives   Statins Other (See Comments)    Joint stiffness   Avelox [Moxifloxacin Hcl In Nacl] Other (See Comments)    SEIZURE   Bactrim [Sulfamethoxazole-Trimethoprim] Hives   Cefdinir Hives   Losartan     Rash    Oregano [Origanum Oil] Cough   Peanut-Containing Drug Products Swelling    Throat swelling   Adhesive  [Tape] Other (See Comments)    "tears skin"   Penicillins Rash    Review of Systems: Negative except as per HPI   Physical Exam: BP 110/70   Pulse 80   Temp 98.1 F (36.7 C) (Temporal)   Resp 18  Physical Exam Constitutional:      Appearance: Normal appearance.  HENT:     Head: Normocephalic and atraumatic.     Comments: Pharynx normal, eyes normal, ears normal, nose normal    Right Ear: Tympanic membrane, ear canal and external ear normal.     Left Ear: Tympanic membrane, ear canal and external ear normal.     Nose: Nose normal.     Mouth/Throat:     Mouth: Mucous membranes are moist.     Pharynx: Oropharynx is clear.  Eyes:     Conjunctiva/sclera: Conjunctivae normal.  Cardiovascular:     Rate and Rhythm: Regular rhythm.     Heart sounds: Normal heart sounds.  Pulmonary:     Effort: Pulmonary effort is normal.     Breath sounds: Normal breath sounds.     Comments: Lungs clear to auscultation Musculoskeletal:     Cervical back: Neck supple.   Skin:    General: Skin is warm.     Comments: Erythema noted on anterior and posterior portion of neck few erythematous papules noted on right posterior portion of neck  Neurological:     Mental Status: He is alert and oriented to person, place, and time.  Psychiatric:        Mood and Affect: Mood normal.        Behavior: Behavior normal.        Thought Content: Thought content normal.        Judgment: Judgment normal.     Diagnostics: FVC 2.54 L (70%), FEV1 1.88 L (67%), FEV1/FVC 0.74.  Predicted FVC 3.65 L, predicted FEV1 2.79 L.  Spirometry indicates possible moderate restriction.  He reports that he did a DuoNeb treatment prior to coming in this morning.  Assessment and Plan: 1. Severe persistent asthma with (acute) exacerbation   2. Chronic urticaria     Meds ordered this encounter  Medications   predniSONE (DELTASONE) 10 MG tablet    Sig: Take 2 tablets twice a day for 3 days, then on the 4th day take 2 tablets in the morning, and on the 5th day take 1 tablet and stop    Dispense:  15 tablet    Refill:  0    Patient Instructions  Asthma with acute exacerbation: severe persistent, history of avascular necrosis from chronic steroid use   History of multiple exacerbations requiring systemic steroids. Currently on Wixela and Montelukast with good adherence. History of successful treatment with Dupixent, discontinued due to cost. -Start prednisone 10 mg taking 2 tablets twice a day for 3 days, then on the 4th day take 2 tablets in the morning, and on the 5th day take one tablet and stop. If your symptoms do not get better please go to the Emergency Room -Continue current regimen of Wixela 500/50 mcg 1 puff twice daily, Montelukast.10mg  at night  - Rescue Inhaler:  Duonebs  . Use  every 4-6 hours as needed for chest tightness, wheezing, or coughing.  Can also use 15 minutes prior to exercise if you have symptoms with activity. - Asthma is not controlled if:  - Symptoms are  occurring >2 times a week OR  - >2 times a month nighttime awakenings  - You are requiring systemic steroids (prednisone/steroid injections) more than once per year  - Your require hospitalization for your asthma.  - Please call the clinic to schedule a follow up if these symptoms arise  -Tezspire recently approved. Tammy, our biologics coordinator, will be in touch with you.  Drug Allergies Multiple reported drug allergies and adverse reactions, including penicillin, baclofen, Avelox, Bactrim, cefdinir, and doxycycline. Patient expresses desire for retesting to expand antibiotic options. -Penicillin testing on 05/16/23 positive  -Continue to avoid all penicillins -Consider cefdinir oral challenge once breathing is under better control and on a biologic   Allergic Rhinitis with Nasal Polyps Chronic symptoms despite use of Flonase. History of nasal polyp removal. Previous successful treatment with Dupixent. -Continue Flonase.1 spray per nostril twice daily  -Continue Allegra 180mg  daily   Food Allergy: Peanut, tree nut, oregano  Patient reports rotation of symptoms every seven years and currently in an "off cycle". Experiences coughing and wheezing with exposure. -Allergy test (05/16/23): negative to tree nut and peanut     Reflux  - Continue dietary and lifestyle modifications   Chronic hives - start prednisone as above. -Consider lab work - If your symptoms re-occur, begin a journal of events that occurred for up to 6 hours before your symptoms began including foods and beverages consumed, soaps or perfumes you had contact with, and medications.   Follow up in 4 weeks with Dr. Marlynn Perking  Return in about 4 weeks (around 06/19/2023), or if symptoms worsen or fail to improve.    Thank you for the opportunity to care for this patient.  Please do not hesitate to contact me with questions.  Nehemiah Settle, FNP [facility]

## 2023-05-23 ENCOUNTER — Telehealth: Payer: Self-pay | Admitting: Family

## 2023-05-23 DIAGNOSIS — M5412 Radiculopathy, cervical region: Secondary | ICD-10-CM

## 2023-05-23 MED ORDER — TEZSPIRE 210 MG/1.91ML ~~LOC~~ SOAJ
210.0000 mg | SUBCUTANEOUS | 11 refills | Status: DC
  Filled 2023-05-25: qty 1.91, 28d supply, fill #0
  Filled 2023-06-14: qty 1.91, 28d supply, fill #1
  Filled 2023-07-18: qty 1.91, 28d supply, fill #2
  Filled 2023-08-15: qty 1.91, 28d supply, fill #3
  Filled 2023-09-12: qty 1.91, 28d supply, fill #4
  Filled 2023-10-12: qty 1.91, 28d supply, fill #5
  Filled 2023-11-10: qty 1.91, 28d supply, fill #6
  Filled 2023-12-08: qty 1.91, 28d supply, fill #7
  Filled 2024-01-02: qty 1.91, 28d supply, fill #8

## 2023-05-23 NOTE — Telephone Encounter (Signed)
See mychart.

## 2023-05-23 NOTE — Telephone Encounter (Signed)
Spoke to patient and he will reach out to plan to sign up for [facility] and reach back out to me to get Rx to [facility] to fill same

## 2023-05-23 NOTE — Telephone Encounter (Signed)
Thanks [employer]

## 2023-05-23 NOTE — Telephone Encounter (Signed)
Patient advised signed up for [facility] will send rx to [facility] with instructions for delivery, storage, dosing and initial inj in clinic with appt

## 2023-05-24 ENCOUNTER — Other Ambulatory Visit (HOSPITAL_COMMUNITY): Payer: Self-pay

## 2023-05-25 ENCOUNTER — Other Ambulatory Visit: Payer: Self-pay

## 2023-05-25 ENCOUNTER — Other Ambulatory Visit (HOSPITAL_COMMUNITY): Payer: Self-pay

## 2023-05-25 NOTE — Progress Notes (Signed)
Specialty Pharmacy Initial Fill Coordination Note  [name] is a 73 y.o. male contacted today regarding initial fill of specialty medication(s) [name]   Patient requested Courier to Provider Office   Delivery date: 05/30/23   Verified address: [address] [place] [address]   Medication will be filled on 02/03.   Patient is aware of $100.00 copayment.

## 2023-05-25 NOTE — Progress Notes (Signed)
Specialty Pharmacy Initiation Note   Michael [name] is a 73 y.o. male who will be followed by the specialty pharmacy service for RxSp Asthma/COPD    Review of administration, indication, effectiveness, safety, potential side effects, storage/disposable, and missed dose instructions occurred today for patient's specialty medication(s) [name]     Patient/Caregiver did not have any additional questions or concerns.   Patient's therapy is appropriate to: Initiate    Goals Addressed             This Visit's Progress    Minimize recurrence of flares       Patient is initiating therapy. Patient will maintain adherence         Servando Snare Specialty Pharmacist

## 2023-05-29 ENCOUNTER — Other Ambulatory Visit: Payer: Self-pay

## 2023-06-01 ENCOUNTER — Ambulatory Visit (INDEPENDENT_AMBULATORY_CARE_PROVIDER_SITE_OTHER): Payer: PPO

## 2023-06-01 ENCOUNTER — Ambulatory Visit: Payer: PPO

## 2023-06-01 DIAGNOSIS — J4550 Severe persistent asthma, uncomplicated: Secondary | ICD-10-CM | POA: Diagnosis not present

## 2023-06-01 DIAGNOSIS — J4551 Severe persistent asthma with (acute) exacerbation: Secondary | ICD-10-CM

## 2023-06-01 MED ORDER — TEZEPELUMAB-EKKO 210 MG/1.91ML ~~LOC~~ SOSY
210.0000 mg | PREFILLED_SYRINGE | Freq: Once | SUBCUTANEOUS | Status: AC
  Administered 2023-06-01 – 2023-06-29 (×2): 210 mg via SUBCUTANEOUS

## 2023-06-05 DIAGNOSIS — M4722 Other spondylosis with radiculopathy, cervical region: Secondary | ICD-10-CM | POA: Diagnosis not present

## 2023-06-05 DIAGNOSIS — M47892 Other spondylosis, cervical region: Secondary | ICD-10-CM | POA: Diagnosis not present

## 2023-06-06 ENCOUNTER — Other Ambulatory Visit: Payer: Self-pay | Admitting: Medical

## 2023-06-06 NOTE — Progress Notes (Addendum)
 Erroneous encounter

## 2023-06-14 ENCOUNTER — Other Ambulatory Visit: Payer: Self-pay | Admitting: Family

## 2023-06-14 ENCOUNTER — Other Ambulatory Visit: Payer: Self-pay

## 2023-06-14 NOTE — Progress Notes (Signed)
 Specialty Pharmacy Refill Coordination Note  Michael [name] is a 73 y.o. male contacted today regarding refills of specialty medication(s) [name]   Patient requested Courier to Provider Office   Delivery date: 06/26/23   Verified address: [address] [place] [address]   Medication will be filled on 06/23/23.

## 2023-06-21 NOTE — Patient Instructions (Incomplete)
 Asthma: severe persistent, history of avascular necrosis from chronic steroid use   History of multiple exacerbations requiring systemic steroids. Currently on Wixela and Montelukast with good adherence. History of successful treatment with Dupixent, discontinued due to cost.  -Continue current regimen of Wixela 500/50 mcg 1 puff twice daily, Montelukast.10mg  at night  - Rescue Inhaler:  Duonebs  . Use  every 4-6 hours as needed for chest tightness, wheezing, or coughing.  Can also use 15 minutes prior to exercise if you have symptoms with activity. - Asthma is not controlled if:  - Symptoms are occurring >2 times a week OR  - >2 times a month nighttime awakenings  - You are requiring systemic steroids (prednisone/steroid injections) more than once per year  - Your require hospitalization for your asthma.  - Please call the clinic to schedule a follow up if these symptoms arise  - Continue Tezspire every 4 weeks  Drug Allergies Multiple reported drug allergies and adverse reactions, including penicillin, baclofen, Avelox, Bactrim, cefdinir, and doxycycline. Patient expresses desire for retesting to expand antibiotic options. -Penicillin testing on 05/16/23 positive  -Continue to avoid all penicillins -Consider cefdinir oral challenge once breathing is under better control on a biologic   Allergic Rhinitis with Nasal Polyps Chronic symptoms despite use of Flonase. History of nasal polyp removal. Previous successful treatment with Dupixent. -Continue Flonase.1 spray per nostril twice daily  -Continue Allegra 180mg  daily   Food Allergy: Peanut, tree nut, oregano  Patient reports rotation of symptoms every seven years and currently in an "off cycle". Experiences coughing and wheezing with exposure. -Allergy test (05/16/23): negative to tree nut and peanut     Reflux  - Continue dietary and lifestyle modifications   Chronic hives -Consider lab work - If your symptoms re-occur, begin a  journal of events that occurred for up to 6 hours before your symptoms began including foods and beverages consumed, soaps or perfumes you had contact with, and medications.   Follow up in months with Dr. Marlynn Perking

## 2023-06-22 ENCOUNTER — Other Ambulatory Visit: Payer: Self-pay

## 2023-06-22 ENCOUNTER — Ambulatory Visit: Payer: PPO | Admitting: Family

## 2023-06-22 ENCOUNTER — Encounter: Payer: Self-pay | Admitting: Family

## 2023-06-22 VITALS — BP 136/88 | HR 59 | Temp 97.7°F | Resp 18 | Ht 66.0 in

## 2023-06-22 DIAGNOSIS — J302 Other seasonal allergic rhinitis: Secondary | ICD-10-CM

## 2023-06-22 DIAGNOSIS — J3089 Other allergic rhinitis: Secondary | ICD-10-CM

## 2023-06-22 DIAGNOSIS — J330 Polyp of nasal cavity: Secondary | ICD-10-CM

## 2023-06-22 DIAGNOSIS — J4551 Severe persistent asthma with (acute) exacerbation: Secondary | ICD-10-CM

## 2023-06-22 DIAGNOSIS — T7800XD Anaphylactic reaction due to unspecified food, subsequent encounter: Secondary | ICD-10-CM | POA: Diagnosis not present

## 2023-06-22 DIAGNOSIS — L508 Other urticaria: Secondary | ICD-10-CM

## 2023-06-22 DIAGNOSIS — Z8739 Personal history of other diseases of the musculoskeletal system and connective tissue: Secondary | ICD-10-CM | POA: Diagnosis not present

## 2023-06-22 DIAGNOSIS — Z881 Allergy status to other antibiotic agents status: Secondary | ICD-10-CM | POA: Diagnosis not present

## 2023-06-22 DIAGNOSIS — T7800XA Anaphylactic reaction due to unspecified food, initial encounter: Secondary | ICD-10-CM

## 2023-06-22 MED ORDER — PREDNISONE 10 MG PO TABS: ORAL_TABLET | ORAL | 0 refills | Status: DC

## 2023-06-22 NOTE — Progress Notes (Signed)
 [address] [place] [address] Dept: [phone]  FOLLOW UP NOTE  Patient ID: Michael [name], male    DOB: [birth year]  Age: 73 y.o. MRN: [id number] Date of Office Visit: 06/22/2023  Assessment  Chief Complaint: Follow-up (Hives resolved, tezpire working well, breathing improved)  HPI Michael [name] is a 73 year old male who presents today for follow-up of severe persistent asthma with acute exacerbation and chronic urticaria.  He denies any new diagnosis or surgeries since his last office visit.  Asthma: He reports that his breathing had been doing pretty good until about 2 or 3 days ago.  He received his first Tezspire injection on June 01, 2023.  He feels like this injection has been very helpful.  He did have a slight headaches for 3 days after the Tezspire injection.  He mentions that his wife has been sick for the past week and her COVID-19, influenza, and strep test have been negative.  He feels like he may be coming down with what she has now.  He declines a COVID-19 test today while in the office.  For the past couple days he has had a dry cough, the sensation of feeling like he is wheezing, but not hearing wheezing, and some tightness in his upper chest this morning.  He denies shortness of breath, nocturnal awakenings due to breathing problems, and fever.  Yesterday he did have some chills that he felt like was weird considering it was warm outside.  Since the symptoms started a few days ago he has been using his albuterol inhaler a couple times a day and it has helped as to where previously it has not.  He has not used his DuoNeb which traditionally helps the most.  Prior to this he had only needed to use his albuterol once or twice.  He continues to take Wixela 500/50 mcg 1 puff twice a day and montelukast 10 mg once a day.  He was previously on Dupixent and doing well, but discontinued due to cost.  He also has a history of avascular necrosis from chronic  steroid use.  Discussed options with his current flareup of adding on budesonide during flareups or start a short dose of prednisone and he would like to have prednisone for these symptoms.  Allergic rhinitis with nasal polyps: He reports clear rhinorrhea a little bit of postnasal drip on 1 side and denies nasal congestion and sore throat.  He continues to use Flonase nasal spray daily and Allegra daily.  Food allergy: He continues to avoid peanuts, tree nuts, and oregano.  Reflux is reported as controlled with dietary and lifestyle modifications.  Chronic hives: He reports that these are dramatically better.  He has had  much less skin problems since starting Tezspire.  He has not had any hives since his last appointment.   Drug Allergies:  Allergies  Allergen Reactions   Baclofen Shortness Of Breath    Asthma exacerbation   Colchicine Other (See Comments)    Pancreatitis   Doxycycline Hives   Statins Other (See Comments)    Joint stiffness   Avelox [Moxifloxacin Hcl In Nacl] Other (See Comments)    SEIZURE   Bactrim [Sulfamethoxazole-Trimethoprim] Hives   Cefdinir Hives   Losartan     Rash    Oregano [Origanum Oil] Cough   Peanut-Containing Drug Products Swelling    Throat swelling   Adhesive  [Tape] Other (See Comments)    "tears skin"   Penicillins Rash  Review of Systems: Negative except as per HPI   Physical Exam: BP 136/88   Pulse (!) 59   Temp 97.7 F (36.5 C) (Temporal)   Resp 18   Ht 5\' 6"  (1.676 m)   SpO2 96%   BMI 37.83 kg/m    Physical Exam Constitutional:      Appearance: Normal appearance.  HENT:     Head: Normocephalic and atraumatic.     Comments: Pharynx normal, eyes normal. Ears normal. Nose: bilateral lower turbinates mildly edematous with clear drainage noted.    Right Ear: Tympanic membrane, ear canal and external ear normal.     Left Ear: Tympanic membrane, ear canal and external ear normal.     Mouth/Throat:     Mouth: Mucous  membranes are moist.     Pharynx: Oropharynx is clear.  Eyes:     Conjunctiva/sclera: Conjunctivae normal.  Cardiovascular:     Rate and Rhythm: Regular rhythm.     Heart sounds: Normal heart sounds.  Pulmonary:     Effort: Pulmonary effort is normal.     Breath sounds: Normal breath sounds.     Comments: Lungs clear to auscultation Musculoskeletal:     Cervical back: Neck supple.  Skin:    General: Skin is warm.  Neurological:     Mental Status: He is alert and oriented to person, place, and time.  Psychiatric:        Mood and Affect: Mood normal.        Behavior: Behavior normal.        Thought Content: Thought content normal.        Judgment: Judgment normal.     Diagnostics: FVC 2.68 L (73%), FEV1 1.80 L (65%), FEV1/FVC 0.67.  Predicted FVC 3.65 L, predicted FEV1 2.79 L.  Spirometry indicates possible moderate restriction.  Assessment and Plan: 1. Severe persistent asthma with (acute) exacerbation   2. Seasonal and perennial allergic rhinitis   3. Allergy to multiple antibiotics   4. Allergy with anaphylaxis due to food   5. Chronic urticaria   6. History of avascular necrosis of capital femoral epiphysis   7. Polyp of nasal cavity     Meds ordered this encounter  Medications   predniSONE (DELTASONE) 10 MG tablet    Sig: Take 2 tablets twice a day for 3 days, then on the 4th day take 2 tablets in the morning, and on the 5th day take 1 tablet and stop    Dispense:  15 tablet    Refill:  0    Patient Instructions  Asthma with acute exacerbation: severe persistent, history of avascular necrosis from chronic steroid use   History of multiple exacerbations requiring systemic steroids. Currently on Wixela and Montelukast with good adherence. History of successful treatment with Dupixent, discontinued due to cost. -Start prednisone 10 mg taking 2 tablets twice a day for 3 days, then on the 4th day take 2 tablets in the morning, and on the 5th day take one tablet and  stop - Offered testing for Covid-19,what we have available to test for at our office, but he declines due to his wife's influneza, Covid-19 and strep test being negative. If you develop a fever recommend getting tested - Also, if symptoms worsen please go to the Emergency Room -Continue current regimen of Wixela 500/50 mcg 1 puff twice daily, Montelukast.10mg  at night  - Rescue Inhaler:  Duonebs  . Use  every 4-6 hours as needed for chest tightness, wheezing, or coughing.  Can  also use 15 minutes prior to exercise if you have symptoms with activity. - Asthma is not controlled if:  - Symptoms are occurring >2 times a week OR  - >2 times a month nighttime awakenings  - You are requiring systemic steroids (prednisone/steroid injections) more than once per year  - Your require hospitalization for your asthma.  - Please call the clinic to schedule a follow up if these symptoms arise  - Continue Tezspire every 4 weeks  Drug Allergies Multiple reported drug allergies and adverse reactions, including penicillin, baclofen, Avelox, Bactrim, cefdinir, and doxycycline. Patient expresses desire for retesting to expand antibiotic options. -Penicillin testing on 05/16/23 positive  -Continue to avoid all penicillins -Consider cefdinir oral challenge once breathing is under better control on a biologic   Allergic Rhinitis with Nasal Polyps Chronic symptoms despite use of Flonase. History of nasal polyp removal. Previous successful treatment with Dupixent. -Continue Flonase.1 spray per nostril twice daily  -Continue Allegra 180mg  daily   Food Allergy: Peanut, tree nut, oregano  Patient reports rotation of symptoms every seven years and currently in an "off cycle". Experiences coughing and wheezing with exposure. -Allergy test (05/16/23): negative to tree nut and peanut     Reflux  - Continue dietary and lifestyle modifications   Chronic hives -Consider lab work - If your symptoms re-occur, begin a  journal of events that occurred for up to 6 hours before your symptoms began including foods and beverages consumed, soaps or perfumes you had contact with, and medications.   Follow up in 2-3 months with Dr. Marlynn Perking  Return in about 2 months (around 08/20/2023), or if symptoms worsen or fail to improve.    Thank you for the opportunity to care for this patient.  Please do not hesitate to contact me with questions.  Nehemiah Settle, FNP [facility]

## 2023-06-23 ENCOUNTER — Other Ambulatory Visit: Payer: Self-pay

## 2023-06-29 ENCOUNTER — Ambulatory Visit: Payer: PPO

## 2023-06-29 DIAGNOSIS — J4550 Severe persistent asthma, uncomplicated: Secondary | ICD-10-CM | POA: Diagnosis not present

## 2023-07-11 ENCOUNTER — Other Ambulatory Visit (HOSPITAL_BASED_OUTPATIENT_CLINIC_OR_DEPARTMENT_OTHER): Payer: Self-pay | Admitting: Pulmonary Disease

## 2023-07-11 ENCOUNTER — Other Ambulatory Visit: Payer: Self-pay | Admitting: Family

## 2023-07-11 DIAGNOSIS — I10 Essential (primary) hypertension: Secondary | ICD-10-CM

## 2023-07-12 DIAGNOSIS — Z85828 Personal history of other malignant neoplasm of skin: Secondary | ICD-10-CM | POA: Diagnosis not present

## 2023-07-12 DIAGNOSIS — L821 Other seborrheic keratosis: Secondary | ICD-10-CM | POA: Diagnosis not present

## 2023-07-13 ENCOUNTER — Ambulatory Visit (HOSPITAL_BASED_OUTPATIENT_CLINIC_OR_DEPARTMENT_OTHER): Payer: PPO | Admitting: Pulmonary Disease

## 2023-07-14 ENCOUNTER — Encounter: Payer: Self-pay | Admitting: Family

## 2023-07-14 MED ORDER — LISINOPRIL 10 MG PO TABS: 10.0000 mg | ORAL_TABLET | Freq: Every day | ORAL | 0 refills | Status: DC

## 2023-07-14 MED ORDER — GABAPENTIN 300 MG PO CAPS: 300.0000 mg | ORAL_CAPSULE | Freq: Every day | ORAL | 0 refills | Status: DC

## 2023-07-18 ENCOUNTER — Other Ambulatory Visit: Payer: Self-pay

## 2023-07-18 NOTE — Progress Notes (Signed)
 Specialty Pharmacy Refill Coordination Note  Michael [name] is a 73 y.o. male contacted today regarding refills of specialty medication(s) [name]   Patient requested (Patient-Rptd) Delivery   Delivery date: (Patient-Rptd) 07/21/23   Verified address: (Patient-Rptd) [address] [place] [address]   Medication will be filled on 03.27.25.

## 2023-07-20 ENCOUNTER — Other Ambulatory Visit: Payer: Self-pay

## 2023-07-20 ENCOUNTER — Other Ambulatory Visit (HOSPITAL_COMMUNITY): Payer: Self-pay

## 2023-07-24 NOTE — Progress Notes (Unsigned)
 Subjective:     Patient ID: Michael [name], male    DOB: Dec 03, 1950, 73 y.o.   MRN: [id number]  No chief complaint on file.   HPI  Discussed the use of AI scribe software for clinical note transcription with the patient, who gave verbal consent to proceed.  History of Present Illness       Health Maintenance Due  Topic Date Due   COVID-19 Vaccine (7 - 2024-25 season) 12/25/2022   Medicare Annual Wellness (AWV)  08/25/2023    Past Medical History:  Diagnosis Date   Allergy    Arthritis    Asthma    Blood transfusion without reported diagnosis    "as a child"   Cancer (HCC)    basal cell and squamous cell carcinoma   Cancer (HCC)    prostate   Cataract 2023   Clotting disorder (HCC)    Colon polyps    COPD (chronic obstructive pulmonary disease) (HCC) 1975   Difficult airway for intubation 09/28/2022   2015 at [facility]   GERD (gastroesophageal reflux disease)    Gout    High cholesterol    History of hepatitis    due to drug interaction?   History of pancreatitis 2016   ?due to colchicine   History of pulmonary embolus (PE) 2016   History of stomach ulcers    Hypertension    Hypothyroidism    Lung nodule    Neuromuscular disorder (HCC)    restless leg   OSA (obstructive sleep apnea) 2015   has CPAP   Osteopenia    Pneumonia    Seizures (HCC)    x1 10 years ago   Sleep apnea    Thyroid disease    hypothyroid   Ulcer 1985   Urinary incontinence     Past Surgical History:  Procedure Laterality Date   BASAL CELL CARCINOMA EXCISION  2017   MOHS.  beneath right eye   BRAIN SURGERY  1967   Fractured skull in an accident   CHOLECYSTECTOMY  2001   COLON SURGERY  2001   Complication of gall bladder surgery   COLONOSCOPY     multiple - last 06/2017   COLONOSCOPY WITH PROPOFOL N/A 01/17/2023   Procedure: COLONOSCOPY WITH PROPOFOL;  Surgeon: Iva Boop, MD;  Location: [facility];  Service: Gastroenterology;  Laterality: N/A;   COSMETIC  SURGERY  2017   Basal cell cancer - MOHS surgery   HERNIA REPAIR  2001   HIP SURGERY     JOINT REPLACEMENT  06/2016   Total hip replacement - left hip   PENILE PROSTHESIS IMPLANT  2016   POLYPECTOMY  01/17/2023   Procedure: POLYPECTOMY;  Surgeon: Iva Boop, MD;  Location: [facility];  Service: Gastroenterology;;   PROSTATECTOMY  2009   SINOSCOPY     SINUS SURGERY WITH INSTATRAK  2014   SQUAMOUS CELL CARCINOMA EXCISION  01/2017   nose   TOOTH EXTRACTION     TOTAL HIP ARTHROPLASTY  06/2016    Family History  Problem Relation Age of Onset   Ulcerative colitis Mother    Colon polyps Mother    Asthma Mother    Arthritis Mother    Skin cancer Mother        basal cell carcinoma   Kidney failure Mother    Atrial fibrillation Mother    Kidney disease Mother    Obesity Mother    Asthma Father    Arthritis Father    Diabetes  Father    Heart attack Father    Hyperlipidemia Father    Hypertension Father    Kidney disease Father    Obesity Father    Diabetes Brother    Arthritis Brother    Hyperlipidemia Brother    Brain cancer Brother        GBM   Cancer Brother    Alcohol abuse Brother    Breast cancer Maternal Grandmother    Cancer Maternal Grandmother    Diabetes Paternal Grandfather    Colon cancer Neg Hx    Esophageal cancer Neg Hx    Liver cancer Neg Hx    Pancreatic cancer Neg Hx    Rectal cancer Neg Hx    Stomach cancer Neg Hx    Crohn's disease Neg Hx     Social History   Socioeconomic History   Marital status: Married    Spouse name: Not on file   Number of children: 3   Years of education: Not on file   Highest education level: Bachelor's degree (e.g., BA, AB, BS)  Occupational History   Not on file  Tobacco Use   Smoking status: Never    Passive exposure: Never   Smokeless tobacco: Never  Vaping Use   Vaping status: Never Used  Substance and Sexual Activity   Alcohol use: Yes    Alcohol/week: 3.0 standard drinks of alcohol    Types: 3  Glasses of wine per week    Comment: occ wine   Drug use: No   Sexual activity: Yes    Birth control/protection: Surgical    Comment: Prostate removal  Other Topics Concern   Not on file  Social History Narrative   Former Engineer, agricultural at [employer], now Environmental health practitioner.    Married   3 daughters- all live locally   Has 7 grandchildren and 1 great grandchild   Enjoys reading, Environmental manager         Lives in a one story home with a bonus room over the garage.   Social Drivers of Corporate investment banker Strain: Low Risk  (04/16/2023)   Overall Financial Resource Strain (CARDIA)    Difficulty of Paying Living Expenses: Not very hard  Food Insecurity: No Food Insecurity (04/16/2023)   Hunger Vital Sign    Worried About Running Out of Food in the Last Year: Never true    Ran Out of Food in the Last Year: Never true  Transportation Needs: No Transportation Needs (04/16/2023)   PRAPARE - Administrator, Civil Service (Medical): No    Lack of Transportation (Non-Medical): No  Physical Activity: Insufficiently Active (04/16/2023)   Exercise Vital Sign    Days of Exercise per Week: 3 days    Minutes of Exercise per Session: 30 min  Stress: Stress Concern Present (04/16/2023)   [employer] of Occupational Health - Occupational Stress Questionnaire    Feeling of Stress : To some extent  Social Connections: Socially Integrated (04/16/2023)   Social Connection and Isolation Panel [NHANES]    Frequency of Communication with Friends and Family: Twice a week    Frequency of Social Gatherings with Friends and Family: Once a week    Attends Religious Services: 1 to 4 times per year    Active Member of [employer] or Organizations: Yes    Attends Banker Meetings: More than 4 times per year    Marital Status: Married  Catering manager Violence: Not At Risk (08/25/2022)  Humiliation, Afraid, Rape, and Kick questionnaire    Fear of Current or  Ex-Partner: No    Emotionally Abused: No    Physically Abused: No    Sexually Abused: No    Outpatient Medications Prior to Visit  Medication Sig Dispense Refill   albuterol (VENTOLIN HFA) 108 (90 Base) MCG/ACT inhaler Inhale 2 puffs into the lungs every 6 (six) hours as needed for wheezing or shortness of breath. 18 g 5   allopurinol (ZYLOPRIM) 100 MG tablet TAKE 1 TABLET BY MOUTH TWICE A DAY 180 tablet 1   amLODipine (NORVASC) 5 MG tablet TAKE 1 TABLET EVERY DAY 90 tablet 1   calcium carbonate (OS-CAL - DOSED IN MG OF ELEMENTAL CALCIUM) 1250 (500 Ca) MG tablet Take 1 tablet by mouth daily.     clindamycin (CLEOCIN) 300 MG capsule Take 300 mg by mouth 3 (three) times daily.     diphenhydrAMINE (BENADRYL) 25 mg capsule Take 25 mg by mouth every 6 (six) hours as needed.     docusate sodium (COLACE) 100 MG capsule Take 200 mg by mouth daily.      EPINEPHrine 0.3 mg/0.3 mL IJ SOAJ injection Use as directed for severe allergic reactions 2 each 2   fexofenadine (ALLEGRA) 180 MG tablet Take 180 mg by mouth daily.      fluticasone (FLONASE) 50 MCG/ACT nasal spray Place 2 sprays into both nostrils daily. 48 mL 1   fluticasone-salmeterol (WIXELA INHUB) 500-50 MCG/ACT AEPB INHALE 1 PUFF INTO THE LUNGS 2 (TWO) TIMES DAILY. IN THE MORNING AND AT BEDTIME. 60 each 11   gabapentin (NEURONTIN) 300 MG capsule Take 1 capsule (300 mg total) by mouth at bedtime. 90 capsule 0   hydrALAZINE (APRESOLINE) 25 MG tablet Take 1 tablet (25 mg total) by mouth in the morning and at bedtime. 180 tablet 1   ipratropium-albuterol (DUONEB) 0.5-2.5 (3) MG/3ML SOLN 1 vial in nebulizer every 4-6 hours while awake for the next 3-5 days for coughing or wheezing. 360 mL 1   levothyroxine (SYNTHROID) 75 MCG tablet Take 1 tablet (75 mcg total) by mouth daily before breakfast. 90 tablet 1   lisinopril (ZESTRIL) 10 MG tablet Take 1 tablet (10 mg total) by mouth daily. 90 tablet 0   Magnesium 250 MG TABS Take by mouth daily.      montelukast (SINGULAIR) 10 MG tablet TAKE ONE TABLET ONCE AT NIGHT FOR COUGHING OR WHEEZING 90 tablet 1   predniSONE (DELTASONE) 10 MG tablet Take 2 tablets twice a day for 3 days, then on the 4th day take 2 tablets in the morning, and on the 5th day take 1 tablet and stop 15 tablet 0   Tezepelumab-ekko (TEZSPIRE) 210 MG/1. SOAJ Inject 210 mg into the skin every 28 (twenty-eight) days. 1.91 mL 11   tiZANidine (ZANAFLEX) 2 MG tablet TAKE 1 TO 2 TABLETS(2 TO 4 MG) BY MOUTH AT BEDTIME AS NEEDED FOR MUSCLE SPASMS 60 tablet 1   No facility-administered medications prior to visit.    Allergies  Allergen Reactions   Baclofen Shortness Of Breath    Asthma exacerbation   Colchicine Other (See Comments)    Pancreatitis   Doxycycline Hives   Statins Other (See Comments)    Joint stiffness   Avelox [Moxifloxacin Hcl In Nacl] Other (See Comments)    SEIZURE   Bactrim [Sulfamethoxazole-Trimethoprim] Hives   Cefdinir Hives   Losartan     Rash    Oregano [Origanum Oil] Cough   Peanut-Containing Drug Products Swelling  Throat swelling   Adhesive  [Tape] Other (See Comments)    "tears skin"   Penicillins Rash    ROS     Objective:    Physical Exam   There were no vitals taken for this visit. Wt Readings from Last 3 Encounters:  05/10/23 234 lb 6.4 oz (106.3 kg)  05/10/23 234 lb 6.4 oz (106.3 kg)  05/10/23 234 lb (106.1 kg)       Assessment & Plan:   Problem List Items Addressed This Visit   None   I am having Lesia Hausen. Mcgue "Daphine Deutscher" maintain his docusate sodium, fexofenadine, Magnesium, calcium carbonate, ipratropium-albuterol, tiZANidine, EPINEPHrine, levothyroxine, albuterol, Wixela Inhub, allopurinol, diphenhydrAMINE, clindamycin, Tezspire, fluticasone, hydrALAZINE, predniSONE, montelukast, amLODipine, gabapentin, and lisinopril.  No orders of the defined types were placed in this encounter.

## 2023-07-25 ENCOUNTER — Ambulatory Visit: Payer: PPO | Admitting: Family

## 2023-07-25 ENCOUNTER — Ambulatory Visit (INDEPENDENT_AMBULATORY_CARE_PROVIDER_SITE_OTHER): Payer: PPO | Admitting: Family

## 2023-07-25 ENCOUNTER — Telehealth: Payer: Self-pay | Admitting: Family

## 2023-07-25 ENCOUNTER — Encounter: Payer: Self-pay | Admitting: Family

## 2023-07-25 VITALS — BP 126/68 | HR 54 | Temp 98.9°F | Resp 16 | Ht 66.0 in | Wt 232.0 lb

## 2023-07-25 DIAGNOSIS — M1A9XX0 Chronic gout, unspecified, without tophus (tophi): Secondary | ICD-10-CM | POA: Diagnosis not present

## 2023-07-25 DIAGNOSIS — I10 Essential (primary) hypertension: Secondary | ICD-10-CM | POA: Diagnosis not present

## 2023-07-25 DIAGNOSIS — J0100 Acute maxillary sinusitis, unspecified: Secondary | ICD-10-CM | POA: Diagnosis not present

## 2023-07-25 DIAGNOSIS — E039 Hypothyroidism, unspecified: Secondary | ICD-10-CM | POA: Diagnosis not present

## 2023-07-25 DIAGNOSIS — Z8546 Personal history of malignant neoplasm of prostate: Secondary | ICD-10-CM

## 2023-07-25 DIAGNOSIS — G4733 Obstructive sleep apnea (adult) (pediatric): Secondary | ICD-10-CM

## 2023-07-25 LAB — TSH: TSH: 2.75 u[IU]/mL (ref 0.35–5.50)

## 2023-07-25 LAB — PSA: PSA: 0 ng/mL — ABNORMAL LOW (ref 0.10–4.00)

## 2023-07-25 MED ORDER — AZITHROMYCIN 250 MG PO TABS: ORAL_TABLET | ORAL | 0 refills | Status: AC

## 2023-07-25 MED ORDER — AMLODIPINE BESYLATE 5 MG PO TABS: 5.0000 mg | ORAL_TABLET | Freq: Every day | ORAL | 1 refills | Status: DC

## 2023-07-25 NOTE — Assessment & Plan Note (Addendum)
 Lab Results  Component Value Date   TSH 3.51 01/24/2023   Clinically stable on synthroid, continue same.  Update TSH.

## 2023-07-25 NOTE — Telephone Encounter (Signed)
 See mychart.

## 2023-07-25 NOTE — Assessment & Plan Note (Signed)
Continues cpap.  

## 2023-07-25 NOTE — Assessment & Plan Note (Signed)
 New- will rx with azithromycin. Limited in antibiotic choices due to his multiple drug allergies.

## 2023-07-25 NOTE — Assessment & Plan Note (Signed)
 Stable

## 2023-07-25 NOTE — Assessment & Plan Note (Signed)
 BP Readings from Last 3 Encounters:  07/25/23 126/68  06/22/23 136/88  05/22/23 110/70   BP stable, continue amlodipine, lisinopril and hydralazine.

## 2023-07-25 NOTE — Progress Notes (Addendum)
   Established Patient Office Visit  Subjective   Patient ID: Michael [name], male    DOB: 1951/01/18  Age: 73 y.o. MRN: [id number]  Chief Complaint  Patient presents with   Hypertension    Here for follow up   Hypothyroidism    Here for follow up    Pleasant 73 yo patient presents for routine follow-up of hypertension and hypothyroidism. He states he is feeling well overall. He is requesting to change his blood pressure management because he feels like his blood pressure is dropping too low when lying down. Since his last visit, he has seen an allergy and asthma specialist and completed oral prednisone taper. He also saw neurosurgery for cervical neck pain. Patient reports that neurosurgeon said surgery is not warranted.    Hypertension    ROS See HPI   Objective:     BP 126/68 (BP Location: Right Arm, Patient Position: Sitting, Cuff Size: Large)   Pulse (!) 54   Temp 98.9 F (37.2 C) (Oral)   Resp 16   Ht 5\' 6"  (1.676 m)   Wt 232 lb (105.2 kg)   SpO2 97%   BMI 37.45 kg/m    Physical Exam Constitutional:      Appearance: Normal appearance.  HENT:     Head: Normocephalic.     Right Ear: External ear normal.     Left Ear: External ear normal.  Cardiovascular:     Rate and Rhythm: Normal rate and regular rhythm.  Pulmonary:     Effort: Pulmonary effort is normal.  Skin:    General: Skin is warm and dry.     Capillary Refill: Capillary refill takes less than 2 seconds.     Comments: Flushed face and neck. Patient states this is per norm.  Neurological:     Mental Status: He is alert and oriented to person, place, and time.  Psychiatric:        Mood and Affect: Mood normal.        Behavior: Behavior normal.      Assessment & Plan:   -Hypertension - swelling improved. Blood pressures good at home. States he is having episodes where he feels his BP is dropping.   -Gout - occasional flares. Continues to make dietary modifications.   -Asthma -  recently began Tezspire. Feels asthma symptoms are well-managed at this time. Following up with allergy and asthma clinic.  -Hypothyroid - repeat TSH today.   -Cervical radiculopathy - saw neurosurgery. Per note, surgery not indicated at this time. C4-C5 injection offered.    Cristopher Peru, RN

## 2023-07-27 ENCOUNTER — Ambulatory Visit

## 2023-07-27 DIAGNOSIS — J4550 Severe persistent asthma, uncomplicated: Secondary | ICD-10-CM

## 2023-07-27 MED ORDER — TEZEPELUMAB-EKKO 210 MG/1.91ML ~~LOC~~ SOSY
210.0000 mg | PREFILLED_SYRINGE | SUBCUTANEOUS | Status: DC
Start: 2023-07-27 — End: 2023-09-08
  Administered 2023-07-27 – 2023-08-24 (×2): 210 mg via SUBCUTANEOUS

## 2023-08-15 ENCOUNTER — Other Ambulatory Visit: Payer: Self-pay

## 2023-08-15 ENCOUNTER — Other Ambulatory Visit: Payer: Self-pay | Admitting: Pharmacy Technician

## 2023-08-15 NOTE — Progress Notes (Signed)
 Specialty Pharmacy Refill Coordination Note  Michael [name] is a 73 y.o. male contacted today regarding refills of specialty medication(s) Tezepelumab -ekko (Tezspire )   Patient requested (Patient-Rptd) Delivery   Delivery date: (Patient-Rptd) 08/22/23   Verified address: (Patient-Rptd) [address] [place], [place] [address]   Medication will be filled on 08/21/23.

## 2023-08-21 ENCOUNTER — Ambulatory Visit: Payer: PPO | Admitting: Internal Medicine

## 2023-08-21 ENCOUNTER — Other Ambulatory Visit: Payer: Self-pay

## 2023-08-21 VITALS — BP 158/94 | HR 66 | Temp 97.3°F | Resp 17

## 2023-08-21 DIAGNOSIS — Z881 Allergy status to other antibiotic agents status: Secondary | ICD-10-CM | POA: Diagnosis not present

## 2023-08-21 DIAGNOSIS — L508 Other urticaria: Secondary | ICD-10-CM | POA: Diagnosis not present

## 2023-08-21 DIAGNOSIS — Z8739 Personal history of other diseases of the musculoskeletal system and connective tissue: Secondary | ICD-10-CM

## 2023-08-21 DIAGNOSIS — T7800XD Anaphylactic reaction due to unspecified food, subsequent encounter: Secondary | ICD-10-CM

## 2023-08-21 DIAGNOSIS — J4550 Severe persistent asthma, uncomplicated: Secondary | ICD-10-CM | POA: Diagnosis not present

## 2023-08-21 DIAGNOSIS — J330 Polyp of nasal cavity: Secondary | ICD-10-CM

## 2023-08-21 DIAGNOSIS — T7800XA Anaphylactic reaction due to unspecified food, initial encounter: Secondary | ICD-10-CM

## 2023-08-21 DIAGNOSIS — J302 Other seasonal allergic rhinitis: Secondary | ICD-10-CM | POA: Diagnosis not present

## 2023-08-21 DIAGNOSIS — J3089 Other allergic rhinitis: Secondary | ICD-10-CM | POA: Diagnosis not present

## 2023-08-21 NOTE — Patient Instructions (Addendum)
 Asthma: severe persistent, controlled   -Continue current regimen of Wixela 500/50 mcg 1 puff twice daily, Montelukast .10mg  at night  - Rescue Inhaler:  Duonebs  . Use  every 4-6 hours as needed for chest tightness, wheezing, or coughing.  Can also use 15 minutes prior to exercise if you have symptoms with activity. - Asthma is not controlled if:  - Symptoms are occurring >2 times a week OR  - >2 times a month nighttime awakenings  - You are requiring systemic steroids (prednisone /steroid injections) more than once per year  - Your require hospitalization for your asthma.  - Please call the clinic to schedule a follow up if these symptoms arise  - Continue Tezspire  every 4 weeks  Drug Allergies. -Penicillin testing on 05/16/23 positive  -Continue to avoid all penicillins -Schedule cefdinir  oral challenge in July   -call a week prior to send in the prescription    Allergic Rhinitis with Nasal Polyps. -Continue Flonase .1 spray per nostril twice daily  -Continue Allegra 180mg  daily   Food Allergy : Peanut , tree nut, oregano  Patient reports rotation of symptoms every seven years and currently in an "off cycle". Experiences coughing and wheezing with exposure. -Allergy  test (05/16/23): negative to tree nut and peanut      Reflux  - Continue dietary and lifestyle modifications   Follow up: for cefdinir  challenge, call 1 week prior so we can put the prescription in   Thank you so much for letting me partake in your care today.  Don't hesitate to reach out if you have any additional concerns!  Orelia Binet, MD  [facility] [place], [place]

## 2023-08-21 NOTE — Progress Notes (Unsigned)
 FOLLOW UP Date of Service/Encounter:  08/22/23  Subjective:  Michael [name] (DOB: 12-Jul-1950) is a 73 y.o. male who [facility] in re-evaluation of the following: asthma, CRSwNP, multiple antibiotics, food allergy , drug allergy , urticaria  History obtained from: chart review and patient.  For Review, LV was on 06/22/23  with Tinnie Forehand, FNP seen for acute visit for asthma flare, treated ith prednisone   . See below for summary of history and diagnostics.   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 History of successful treatment with Dupixent , discontinued due to cost.  - OCS 2/25  -Tezspire  started 2/25 - RX: wixela 500, montelukast   - mod obstr spirometry (06/22/23): ratio 67, 1.80L, 65% FEV1 (pre),  Allergic Rhinitis with nasal polyps  Chronic symptoms despite use of Flonase . History of nasal polyp removal. Previous successful treatment with Dupixent . Food Allergy : Peanut , tree nut, oregano  Patient reports rotation of symptoms every seven years and currently in an "off cycle". Experiences coughing and wheezing with exposure. - SPT select foods (05/16/23): negative to peanut  and tree nut   Multiple drug allergies  : Multiple reported drug allergies and adverse reactions, including penicillin, baclofen , Avelox, Bactrim , cefdinir , and doxycycline. - PCN testing (05/16/23): positive  - Plan for cefdinir  challenge  Urticaria:  - labs (not done ) Other: GERD  --------------------------------------------------- Today presents for follow-up. Discussed the use of AI scribe software for clinical note transcription with the patient, who gave verbal consent to proceed.  History of Present Illness Michael [name] "Michael [name]" is a 73 year old male  with asthma and nasal polyps who presents for a follow-up regarding his medication regimen and a planned cefdinir  challenge.  He is currently on Tezspire , which has significantly improved his asthma symptoms, including nocturnal asthma, and his breathing is under control. He has a history of nasal polyps, which have been surgically removed twice. Denies any worsening nasal congestion or anosmia  He has experienced rashes or hives with most antibiotics, except for Avelox, which caused a severe reaction requiring an ER visit. He has tolerated myosins with only a delayed rash and is willing to manage the rash if necessary for severe sinus infections. A cefdinir  challenge is planned to assess his tolerance.  He has a history of sinus surgeries and uses a CPAP machine, which he diligently cleans to prevent infections. He often experiences infections on the left side, which he attributes to his sleeping position. He is unable to use alternative devices like nasal pillows due to the severity of his condition.  He is currently taking Allegra for allergies and is concerned about his use of Benadryl  due to potential dementia risks. He experiences allergy  symptoms such as sneezing and mild congestion, particularly during pollen season.       All medications reviewed by clinical staff and updated in chart. No new pertinent medical or surgical history except as noted in HPI.  ROS: All others negative except as noted per HPI.   Objective:  BP (!) 158/94 Comment: pt states he had a stressful morning right before coming in  Pulse 66   Temp (!) 97.3 F (36.3 C) (Temporal)   Resp 17   SpO2 96%  There is no height or weight on file to calculate BMI. Physical Exam: General Appearance:  Alert, cooperative, no distress, appears stated age  Head:  Normocephalic, without obvious abnormality,  atraumatic  Eyes:  Conjunctiva clear, EOM's intact  Ears EACs normal bilaterally  Nose: Nares normal,  Pink mucosa,  hypertrophic turbinates, no visible anterior polyps, and septum midline  Throat: Lips, tongue normal; teeth and gums normal, normal posterior oropharynx  Neck: Supple, symmetrical  Lungs:   clear to auscultation bilaterally, Respirations unlabored, no coughing  Heart:  regular rate and rhythm and no murmur, Appears well perfused  Extremities: No edema  Skin: Skin color, texture, turgor normal and no rashes or lesions on visualized portions of skin  Neurologic: No gross deficits   Labs:  Lab Orders  No laboratory test(s) ordered today      Assessment/Plan     Asthma: severe persistent, controlled   -Continue current regimen of Wixela 500/50 mcg 1 puff twice daily, Montelukast .10mg  at night  - Rescue Inhaler:  Duonebs  . Use  every 4-6 hours as needed for chest tightness, wheezing, or coughing.  Can also use 15 minutes prior to exercise if you have symptoms with activity. - Asthma is not controlled if:  - Symptoms are occurring >2 times a week OR  - >2 times a month nighttime awakenings  - You are requiring systemic steroids (prednisone /steroid injections) more than once per year  - Your require hospitalization for your asthma.  - Please call the clinic to schedule a follow up if these symptoms arise  - Continue Tezspire  every 4 weeks  Drug Allergies. -Penicillin testing on 05/16/23 positive  -Continue to avoid all penicillins -Schedule cefdinir  oral challenge in July   -call a week prior to send in the prescription    Allergic Rhinitis with Nasal Polyps. -Continue Flonase .1 spray per nostril twice daily  -Continue Allegra 180mg  daily   Food Allergy : Peanut , tree nut, oregano  Patient reports rotation of symptoms every seven years and currently in an "off cycle". Experiences coughing and wheezing with exposure. -Allergy  test (05/16/23): negative to tree nut and peanut      Reflux  - Continue dietary and lifestyle modifications   Follow up: for cefdinir  challenge, call 1  week prior so we can put the prescription in   Thank you so much for letting me partake in your care today.  Don't hesitate to reach out if you have any additional concerns!  Orelia Binet, MD  [facility] [place], [place]    Other:     Thank you so much for letting me partake in your care today.  Don't hesitate to reach out if you have any additional concerns!  Orelia Binet, MD  [facility] [place], [place]

## 2023-08-22 MED ORDER — EPINEPHRINE 0.3 MG/0.3ML IJ SOAJ: INTRAMUSCULAR | 2 refills | Status: AC

## 2023-08-22 MED ORDER — FLUTICASONE-SALMETEROL 500-50 MCG/ACT IN AEPB: 1.0000 | INHALATION_SPRAY | Freq: Two times a day (BID) | RESPIRATORY_TRACT | 11 refills | Status: AC

## 2023-08-22 MED ORDER — IPRATROPIUM-ALBUTEROL 0.5-2.5 (3) MG/3ML IN SOLN: RESPIRATORY_TRACT | 1 refills | Status: AC

## 2023-08-24 ENCOUNTER — Ambulatory Visit

## 2023-08-24 DIAGNOSIS — J4550 Severe persistent asthma, uncomplicated: Secondary | ICD-10-CM | POA: Diagnosis not present

## 2023-08-31 ENCOUNTER — Encounter (HOSPITAL_COMMUNITY): Payer: Self-pay

## 2023-09-01 ENCOUNTER — Other Ambulatory Visit: Payer: Self-pay | Admitting: Medical Genetics

## 2023-09-01 ENCOUNTER — Encounter (HOSPITAL_COMMUNITY): Payer: Self-pay

## 2023-09-07 ENCOUNTER — Ambulatory Visit

## 2023-09-07 VITALS — Ht 66.0 in | Wt 232.0 lb

## 2023-09-07 DIAGNOSIS — Z0000 Encounter for general adult medical examination without abnormal findings: Secondary | ICD-10-CM

## 2023-09-07 NOTE — Patient Instructions (Addendum)
 Michael [name] , Thank you for taking time out of your busy schedule to complete your Annual Wellness Visit with me. I enjoyed our conversation and look forward to speaking with you again next year. I, as well as your care team,  appreciate your ongoing commitment to your health goals. Please review the following plan we discussed and let me know if I can assist you in the future. Your Game plan/ To Do List    Referrals: If you haven't heard from the office you've been referred to, please reach out to them at the phone provided.    Follow up Visits: Next Medicare AWV with our clinical staff: 09/12/24 @ 8:50a   Have you seen your provider in the last 6 months (3 months if uncontrolled diabetes)? Yes Next Office Visit with your provider: 09/08/23 @ 8:50a  Clinician Recommendations:  Aim for 30 minutes of exercise or brisk walking, 6-8 glasses of water, and 5 servings of fruits and vegetables each day.        This is a list of the screening recommended for you and due dates:  Health Maintenance  Topic Date Due   COVID-19 Vaccine (7 - 2024-25 season) 12/25/2022   DTaP/Tdap/Td vaccine (2 - Td or Tdap) 08/16/2023   Flu Shot  11/24/2023   Medicare Annual Wellness Visit  09/06/2024   Colon Cancer Screening  01/16/2026   Pneumonia Vaccine  Completed   Hepatitis C Screening  Completed   Zoster (Shingles) Vaccine  Completed   HPV Vaccine  Aged Out   Meningitis B Vaccine  Aged Out    Advanced directives: (Copy Requested) Please bring a copy of your health care power of attorney and living will to the office to be added to your chart at your convenience. You can mail to [facility] [address] [place], [place] [address] or email to [email or web address] Advance Care Planning is important because it:  [x]  Makes sure you receive the medical care that is consistent with your values, goals, and preferences  [x]  It provides guidance to your family and loved ones and reduces  their decisional burden about whether or not they are making the right decisions based on your wishes.  Follow the link provided in your after visit summary or read over the paperwork we have mailed to you to help you started getting your Advance Directives in place. If you need assistance in completing these, please reach out to us  so that we can help you!  See attachments for Preventive Care and Fall Prevention Tips.

## 2023-09-07 NOTE — Progress Notes (Signed)
 Subjective:   Michael [name] is a 73 y.o. who presents for a Medicare Wellness preventive visit.  As a reminder, Annual Wellness Visits don't include a physical exam, and some assessments may be limited, especially if this visit is performed virtually. We may recommend an in-person visit if needed.  Visit Complete: Virtual I connected with  Michael [name] on 09/07/23 by a video and audio enabled telemedicine application and verified that I am speaking with the correct person using two identifiers.  Patient Location: Home  Provider Location: Home Office  I discussed the limitations of evaluation and management by telemedicine. The patient expressed understanding and agreed to proceed.  Vital Signs: Because this visit was a virtual/telehealth visit, some criteria may be missing or patient reported. Any vitals not documented were not able to be obtained and vitals that have been documented are patient reported.    Persons Participating in Visit: Patient.  AWV Questionnaire: No: Patient Medicare AWV questionnaire was not completed prior to this visit.  Cardiac Risk Factors include: advanced age (>50men, >37 women);male gender;hypertension     Objective:     Today's Vitals   09/07/23 0843  Weight: 232 lb (105.2 kg)  Height: 5\' 6"  (1.676 m)   Body mass index is 37.45 kg/m.     09/07/2023    8:50 AM 01/17/2023    9:22 AM 08/25/2022    3:42 PM 08/13/2021    3:42 PM 11/16/2020    9:04 AM 01/01/2020    8:03 AM 09/25/2019    8:02 AM  Advanced Directives  Does Patient Have a Medical Advance Directive? Yes Yes Yes Yes Yes Yes Yes  Type of Estate agent of [place];Living will Healthcare Power of [place];Living will Healthcare Power of [place];Living will Healthcare Power of [place];Living will Healthcare Power of [place];Living will;Out of facility DNR (pink MOST or yellow form) Healthcare Power of [place];Living will   Does patient want to make  changes to medical advance directive?      No - Patient declined No - Patient declined  Copy of Healthcare Power of Attorney in Chart? No - copy requested No - copy requested No - copy requested No - copy requested  No - copy requested     Current Medications (verified) Outpatient Encounter Medications as of 09/07/2023  Medication Sig   albuterol  (VENTOLIN  HFA) 108 (90 Base) MCG/ACT inhaler Inhale 2 puffs into the lungs every 6 (six) hours as needed for wheezing or shortness of breath.   allopurinol  (ZYLOPRIM ) 100 MG tablet TAKE 1 TABLET BY MOUTH TWICE A DAY   amLODipine  (NORVASC ) 5 MG tablet Take 1 tablet (5 mg total) by mouth at bedtime.   calcium carbonate (OS-CAL - DOSED IN MG OF ELEMENTAL CALCIUM) 1250 (500 Ca) MG tablet Take 1 tablet by mouth daily.   clindamycin  (CLEOCIN ) 300 MG capsule Take 300 mg by mouth 3 (three) times daily.   diphenhydrAMINE  (BENADRYL ) 25 mg capsule Take 25 mg by mouth every 6 (six) hours as needed.   docusate sodium (COLACE) 100 MG capsule Take 200 mg by mouth daily.    EPINEPHrine  0.3 mg/0.3 mL IJ SOAJ injection Use as directed for severe allergic reactions   fexofenadine (ALLEGRA) 180 MG tablet Take 180 mg by mouth daily.    fluticasone  (FLONASE ) 50 MCG/ACT nasal spray Place 2 sprays into both nostrils daily.   fluticasone -salmeterol (WIXELA INHUB) 500-50 MCG/ACT AEPB Inhale 1 puff into the lungs in the morning and at bedtime.   gabapentin  (NEURONTIN )  300 MG capsule Take 1 capsule (300 mg total) by mouth at bedtime.   hydrALAZINE  (APRESOLINE ) 25 MG tablet Take 1 tablet (25 mg total) by mouth in the morning and at bedtime.   ipratropium-albuterol  (DUONEB) 0.5-2.5 (3) MG/3ML SOLN 1 vial in nebulizer every 4-6 hours while awake for the next 3-5 days for coughing or wheezing.   levothyroxine  (SYNTHROID ) 75 MCG tablet Take 1 tablet (75 mcg total) by mouth daily before breakfast.   lisinopril  (ZESTRIL ) 10 MG tablet Take 1 tablet (10 mg total) by mouth daily.    Magnesium 250 MG TABS Take by mouth daily.   montelukast  (SINGULAIR ) 10 MG tablet TAKE ONE TABLET ONCE AT NIGHT FOR COUGHING OR WHEEZING   Tezepelumab -ekko (TEZSPIRE ) 210 MG/1. SOAJ Inject 210 mg into the skin every 28 (twenty-eight) days.   tiZANidine  (ZANAFLEX ) 2 MG tablet TAKE 1 TO 2 TABLETS(2 TO 4 MG) BY MOUTH AT BEDTIME AS NEEDED FOR MUSCLE SPASMS   Facility-Administered Encounter Medications as of 09/07/2023  Medication   tezepelumab -ekko (TEZSPIRE ) 210 MG/1. syringe 210 mg    Allergies (verified) Baclofen , Colchicine, Doxycycline, Statins, Avelox [moxifloxacin hcl in nacl], Bactrim  [sulfamethoxazole -trimethoprim ], Cefdinir , Losartan , Oregano [origanum oil], Peanut -containing drug products, Adhesive  [tape], and Penicillins   History: Past Medical History:  Diagnosis Date   Allergy     Arthritis    Asthma    Blood transfusion without reported diagnosis    "as a child"   Cancer (HCC)    basal cell and squamous cell carcinoma   Cancer (HCC)    prostate   Cataract 2023   Clotting disorder (HCC)    Colon polyps    COPD (chronic obstructive pulmonary disease) (HCC) 1975   Difficult airway for intubation 09/28/2022   2015 at [facility]   GERD (gastroesophageal reflux disease)    Gout    High cholesterol    History of hepatitis    due to drug interaction?   History of pancreatitis 2016   ?due to colchicine   History of pulmonary embolus (PE) 2016   History of stomach ulcers    Hypertension    Hypothyroidism    Lung nodule    Neuromuscular disorder (HCC)    restless leg   OSA (obstructive sleep apnea) 2015   has CPAP   Osteopenia    Pneumonia    Seizures (HCC)    x1 10 years ago   Sleep apnea    Thyroid  disease    hypothyroid   Ulcer 1985   Urinary incontinence    Past Surgical History:  Procedure Laterality Date   BASAL CELL CARCINOMA EXCISION  2017   MOHS.  beneath right eye   BRAIN SURGERY  1967   Fractured skull in an accident   CHOLECYSTECTOMY   2001   COLON SURGERY  2001   Complication of gall bladder surgery   COLONOSCOPY     multiple - last 06/2017   COLONOSCOPY WITH PROPOFOL  N/A 01/17/2023   Procedure: COLONOSCOPY WITH PROPOFOL ;  Surgeon: Kenney Peacemaker, MD;  Location: [facility];  Service: Gastroenterology;  Laterality: N/A;   COSMETIC SURGERY  2017   Basal cell cancer - MOHS surgery   HERNIA REPAIR  2001   HIP SURGERY     JOINT REPLACEMENT  06/2016   Total hip replacement - left hip   PENILE PROSTHESIS IMPLANT  2016   POLYPECTOMY  01/17/2023   Procedure: POLYPECTOMY;  Surgeon: Kenney Peacemaker, MD;  Location: [facility];  Service: Gastroenterology;;   PROSTATECTOMY  2009   SINOSCOPY     SINUS SURGERY WITH INSTATRAK  2014   SQUAMOUS CELL CARCINOMA EXCISION  01/2017   nose   TOOTH EXTRACTION     TOTAL HIP ARTHROPLASTY  06/2016   Family History  Problem Relation Age of Onset   Ulcerative colitis Mother    Colon polyps Mother    Asthma Mother    Arthritis Mother    Skin cancer Mother        basal cell carcinoma   Kidney failure Mother    Atrial fibrillation Mother    Kidney disease Mother    Obesity Mother    Asthma Father    Arthritis Father    Diabetes Father    Heart attack Father    Hyperlipidemia Father    Hypertension Father    Kidney disease Father    Obesity Father    Diabetes Brother    Arthritis Brother    Hyperlipidemia Brother    Brain cancer Brother        GBM   Cancer Brother    Alcohol abuse Brother    Breast cancer Maternal Grandmother    Cancer Maternal Grandmother    Diabetes Paternal Grandfather    Colon cancer Neg Hx    Esophageal cancer Neg Hx    Liver cancer Neg Hx    Pancreatic cancer Neg Hx    Rectal cancer Neg Hx    Stomach cancer Neg Hx    Crohn's disease Neg Hx    Social History   Socioeconomic History   Marital status: Married    Spouse name: Not on file   Number of children: 3   Years of education: Not on file   Highest education level: Bachelor's degree  (e.g., BA, AB, BS)  Occupational History   Not on file  Tobacco Use   Smoking status: Never    Passive exposure: Never   Smokeless tobacco: Never  Vaping Use   Vaping status: Never Used  Substance and Sexual Activity   Alcohol use: Yes    Alcohol/week: 3.0 standard drinks of alcohol    Types: 3 Glasses of wine per week    Comment: occ wine   Drug use: No   Sexual activity: Yes    Birth control/protection: Surgical    Comment: Prostate removal  Other Topics Concern   Not on file  Social History Narrative   Former Engineer, agricultural at [employer], now Environmental health practitioner.    Married   3 daughters- all live locally   Has 7 grandchildren and 1 great grandchild   Enjoys reading, Environmental manager         Lives in a one story home with a bonus room over the garage.   Social Drivers of Corporate investment banker Strain: Low Risk  (09/07/2023)   Overall Financial Resource Strain (CARDIA)    Difficulty of Paying Living Expenses: Not hard at all  Food Insecurity: No Food Insecurity (09/07/2023)   Hunger Vital Sign    Worried About Running Out of Food in the Last Year: Never true    Ran Out of Food in the Last Year: Never true  Transportation Needs: No Transportation Needs (09/07/2023)   PRAPARE - Administrator, Civil Service (Medical): No    Lack of Transportation (Non-Medical): No  Physical Activity: Insufficiently Active (09/07/2023)   Exercise Vital Sign    Days of Exercise per Week: 3 days    Minutes of Exercise per Session: 30  min  Stress: No Stress Concern Present (09/07/2023)   [employer] of Occupational Health - Occupational Stress Questionnaire    Feeling of Stress : Not at all  Social Connections: Socially Integrated (09/07/2023)   Social Connection and Isolation Panel [NHANES]    Frequency of Communication with Friends and Family: More than three times a week    Frequency of Social Gatherings with Friends and Family: More than three times a week     Attends Religious Services: More than 4 times per year    Active Member of [employer] or Organizations: Yes    Attends Engineer, structural: More than 4 times per year    Marital Status: Married    Tobacco Counseling Counseling given: Not Answered    Clinical Intake:  Pre-visit preparation completed: Yes  Pain : No/denies pain     BMI - recorded: 37.45 Nutritional Status: BMI > 30  Obese Nutritional Risks: None Diabetes: No  Lab Results  Component Value Date   HGBA1C 5.4 05/10/2023     How often do you need to have someone help you when you read instructions, pamphlets, or other written materials from your doctor or pharmacy?: 1 - Never  Interpreter Needed?: No  Information entered by :: Farris Hong LPN   Activities of Daily Living     09/07/2023    8:49 AM  In your present state of health, do you have any difficulty performing the following activities:  Hearing? 0  Vision? 0  Difficulty concentrating or making decisions? 0  Walking or climbing stairs? 0  Dressing or bathing? 0  Doing errands, shopping? 0  Preparing Food and eating ? N  Using the Toilet? N  In the past six months, have you accidently leaked urine? N  Do you have problems with loss of bowel control? N  Managing your Medications? N  Managing your Finances? N  Housekeeping or managing your Housekeeping? N    Patient Care Team: Dorrene Gaucher, NP as PCP - General (Internal Medicine) Leim Pupa, OD as Referring Physician (Optometry) Bulah Caro., MD (General Practice) Orvan Blanch, MD as Attending Physician (Urology) Tat, Von Grumbling, DO as Consulting Physician (Neurology)  Indicate any recent Medical Services you may have received from other than [facility] providers in the past year (date may be approximate).     Assessment:    This is a routine wellness examination for [place].  Hearing/Vision screen Hearing Screening - Comments:: Denies hearing difficulties   Vision  Screening - Comments:: Wears rx glasses - up to [facility] exams [facility]   Goals Addressed               This Visit's Progress     Increase physical activity (pt-stated)        Remain active       Depression Screen     09/07/2023    8:49 AM 08/25/2022    3:45 PM 08/13/2021    3:35 PM 10/21/2020   11:08 AM 07/03/2020    7:19 AM 06/23/2017    9:07 AM  PHQ 2/9 Scores  PHQ - 2 Score 0 0 0 0 0 0    Fall Risk     09/07/2023    8:50 AM 08/15/2022    4:42 PM 08/13/2021    3:44 PM 11/16/2020    9:04 AM 10/21/2020   11:08 AM  Fall Risk   Falls in the past year? 0 0 0 0  0  Number falls in past yr: 0 0 0 0 0  Injury with Fall? 0 0 0 0 0  Risk for fall due to : No Fall Risks No Fall Risks No Fall Risks    Follow up Falls prevention discussed;Falls evaluation completed Falls evaluation completed Falls prevention discussed      MEDICARE RISK AT HOME:  Medicare Risk at Home Any stairs in or around the home?: No If so, are there any without handrails?: No Home free of loose throw rugs in walkways, pet beds, electrical cords, etc?: Yes Adequate lighting in your home to reduce risk of falls?: Yes Life alert?: Yes Use of a cane, walker or w/c?: No Grab bars in the bathroom?: No Shower chair or bench in shower?: Yes Elevated toilet seat or a handicapped toilet?: Yes  TIMED UP AND GO:  Was the test performed?  No  Cognitive Function: 6CIT completed        09/07/2023    8:50 AM 08/25/2022    3:57 PM 08/13/2021    3:48 PM  6CIT Screen  What Year? 0 points 0 points 0 points  What month? 0 points 0 points 0 points  What time? 0 points 0 points 0 points  Count back from 20 0 points 0 points 0 points  Months in reverse 0 points 0 points 0 points  Repeat phrase 0 points 0 points 0 points  Total Score 0 points 0 points 0 points    Immunizations Immunization History  Administered Date(s) Administered   Fluad Quad(high Dose 65+) 01/29/2019, 07/03/2020,  01/12/2021   Fluad Trivalent(High Dose 65+) 01/24/2023   Influenza Split 04/08/2013   Influenza, High Dose Seasonal PF 01/19/2018   Influenza, Quadrivalent, Recombinant, Inj, Pf 01/13/2017   Influenza-Unspecified 01/21/2014, 01/20/2015, 01/21/2016, 12/10/2021, 01/24/2022   PFIZER Comirnaty(Gray Top)Covid-19 Tri-Sucrose Vaccine 10/09/2020   PFIZER(Purple Top)SARS-COV-2 Vaccination 06/06/2019, 07/01/2019, 03/03/2020, 01/24/2022   Pfizer Covid-19 Vaccine Bivalent Booster 49yrs & up 01/20/2021   Pneumococcal Conjugate-13 08/15/2013, 11/16/2018   Pneumococcal Polysaccharide-23 11/01/2013, 12/18/2019   Respiratory Syncytial Virus Vaccine,Recomb Aduvanted(Arexvy) 02/07/2022   Tdap 08/15/2013   Zoster Recombinant(Shingrix ) 01/19/2018, 06/15/2018   Zoster, Live 05/14/2015    Screening Tests Health Maintenance  Topic Date Due   COVID-19 Vaccine (7 - 2024-25 season) 12/25/2022   DTaP/Tdap/Td (2 - Td or Tdap) 08/16/2023   INFLUENZA VACCINE  11/24/2023   Medicare Annual Wellness (AWV)  09/06/2024   Colonoscopy  01/16/2026   Pneumonia Vaccine 67+ Years old  Completed   Hepatitis C Screening  Completed   Zoster Vaccines- Shingrix   Completed   HPV VACCINES  Aged Out   Meningococcal B Vaccine  Aged Out    Health Maintenance  Health Maintenance Due  Topic Date Due   COVID-19 Vaccine (7 - 2024-25 season) 12/25/2022   DTaP/Tdap/Td (2 - Td or Tdap) 08/16/2023   Health Maintenance Items Addressed:   Additional Screening:  Vision Screening: Recommended annual ophthalmology exams for early detection of glaucoma and other disorders of the eye.  Dental Screening: Recommended annual dental exams for proper oral hygiene  Community Resource Referral / Chronic Care Management: CRR required this visit?  No   CCM required this visit?  No   Plan:    I have personally reviewed and noted the following in the patient's chart:   Medical and social history Use of alcohol, tobacco or illicit  drugs  Current medications and supplements including opioid prescriptions. Patient is not currently taking opioid prescriptions. Functional ability and status Nutritional  status Physical activity Advanced directives List of other physicians Hospitalizations, surgeries, and ER visits in previous 12 months Vitals Screenings to include cognitive, depression, and falls Referrals and appointments  In addition, I have reviewed and discussed with patient certain preventive protocols, quality metrics, and best practice recommendations. A written personalized care plan for preventive services as well as general preventive health recommendations were provided to patient.   Dewayne Ford, LPN   6/43/3295   After Visit Summary: (MyChart) Due to this being a telephonic visit, the after visit summary with patients personalized plan was offered to patient via MyChart   Notes: Nothing significant to report at this time.

## 2023-09-08 ENCOUNTER — Ambulatory Visit (INDEPENDENT_AMBULATORY_CARE_PROVIDER_SITE_OTHER): Payer: PPO | Admitting: Family

## 2023-09-08 VITALS — BP 130/64 | HR 55 | Temp 98.7°F | Resp 16 | Ht 66.0 in | Wt 232.0 lb

## 2023-09-08 DIAGNOSIS — Z8546 Personal history of malignant neoplasm of prostate: Secondary | ICD-10-CM | POA: Diagnosis not present

## 2023-09-08 DIAGNOSIS — E782 Mixed hyperlipidemia: Secondary | ICD-10-CM

## 2023-09-08 DIAGNOSIS — I10 Essential (primary) hypertension: Secondary | ICD-10-CM | POA: Diagnosis not present

## 2023-09-08 DIAGNOSIS — G2581 Restless legs syndrome: Secondary | ICD-10-CM

## 2023-09-08 DIAGNOSIS — K219 Gastro-esophageal reflux disease without esophagitis: Secondary | ICD-10-CM | POA: Diagnosis not present

## 2023-09-08 DIAGNOSIS — M1A9XX0 Chronic gout, unspecified, without tophus (tophi): Secondary | ICD-10-CM

## 2023-09-08 DIAGNOSIS — J4550 Severe persistent asthma, uncomplicated: Secondary | ICD-10-CM

## 2023-09-08 DIAGNOSIS — M109 Gout, unspecified: Secondary | ICD-10-CM | POA: Diagnosis not present

## 2023-09-08 DIAGNOSIS — G4733 Obstructive sleep apnea (adult) (pediatric): Secondary | ICD-10-CM | POA: Diagnosis not present

## 2023-09-08 DIAGNOSIS — E039 Hypothyroidism, unspecified: Secondary | ICD-10-CM | POA: Diagnosis not present

## 2023-09-08 LAB — LIPID PANEL
Cholesterol: 177 mg/dL (ref 0–200)
HDL: 41.6 mg/dL (ref >39.00)
LDL Cholesterol: 104 mg/dL — ABNORMAL HIGH (ref 0–99)
NonHDL: 135.81
Total CHOL/HDL Ratio: 4
Triglycerides: 159 mg/dL — ABNORMAL HIGH (ref 0.0–149.0)
VLDL: 31.8 mg/dL (ref 0.0–40.0)

## 2023-09-08 MED ORDER — ALLOPURINOL 100 MG PO TABS: 100.0000 mg | ORAL_TABLET | Freq: Two times a day (BID) | ORAL | 1 refills | Status: DC

## 2023-09-08 MED ORDER — LISINOPRIL 10 MG PO TABS: 10.0000 mg | ORAL_TABLET | Freq: Every day | ORAL | 0 refills | Status: DC

## 2023-09-08 MED ORDER — ALBUTEROL SULFATE HFA 108 (90 BASE) MCG/ACT IN AERS: 2.0000 | INHALATION_SPRAY | Freq: Four times a day (QID) | RESPIRATORY_TRACT | 5 refills | Status: AC | PRN

## 2023-09-08 MED ORDER — TEZEPELUMAB-EKKO 210 MG/1.91ML ~~LOC~~ SOSY
210.0000 mg | PREFILLED_SYRINGE | SUBCUTANEOUS | Status: AC
  Administered 2023-09-21 – 2023-10-20 (×2): 210 mg via SUBCUTANEOUS

## 2023-09-08 MED ORDER — GABAPENTIN 300 MG PO CAPS: 300.0000 mg | ORAL_CAPSULE | Freq: Every day | ORAL | 0 refills | Status: DC

## 2023-09-08 NOTE — Assessment & Plan Note (Signed)
 Stable on tezpire, advair and singulair .

## 2023-09-08 NOTE — Progress Notes (Signed)
 Subjective:     Patient ID: Michael [name], male    DOB: [birth year], 73 y.o.   MRN: [id number]  Chief Complaint  Patient presents with   Hypertension    Here for follow up   Hypothyroidism    Here for follow up    Hypertension    Discussed the use of AI scribe software for clinical note transcription with the patient, who gave verbal consent to proceed.  History of Present Illness Michael [name] "Michael [name]" is a 73 year old male with hypertension and asthma who presents for a regular follow-up visit.  His blood pressure is well-controlled with a recent reading of 130/64 mmHg. He takes amlodipine  5 mg, hydralazine , and lisinopril , with only one episode of high blood pressure in the past 30 days. Asthma symptoms have decreased with regular gym visits. He is under pulmonary care and switched from Benadryl  to Allegra twice daily for allergies, noting easier sleep with Benadryl . He consistently uses a CPAP machine and maintains it well. Thyroid  function was normal in April, and he continues on levothyroxine  75 mcg.  He experiences tinnitus, described as 'background noise like cicadas,' without hearing loss. Tinnitus ceased temporarily after propofol  during a colonoscopy. Cholesterol levels are good except for elevated triglycerides, which he addresses with dietary changes, such as eliminating tortillas.  A recent gout attack resolved with increased water intake, and he avoids trigger foods to manage gout. Reflux occurs minimally, mainly when traveling, and he does not take daily medication for it. Restless leg syndrome is managed with gabapentin . He experiences electrolyte issues after exertion, managed with liquid IV solutions.      Health Maintenance Due  Topic Date Due   COVID-19 Vaccine (7 - 2024-25 season) 12/25/2022   DTaP/Tdap/Td (2 - Td or Tdap) 08/16/2023    Past Medical History:  Diagnosis Date   Allergy     Arthritis    Asthma    Blood transfusion  without reported diagnosis    "as a child"   Cancer (HCC)    basal cell and squamous cell carcinoma   Cancer (HCC)    prostate   Cataract 2023   Clotting disorder (HCC)    Colon polyps    COPD (chronic obstructive pulmonary disease) (HCC) 1975   Difficult airway for intubation 09/28/2022   2015 at [facility]   GERD (gastroesophageal reflux disease)    Gout    High cholesterol    History of hepatitis    due to drug interaction?   History of pancreatitis 2016   ?due to colchicine   History of pulmonary embolus (PE) 2016   History of stomach ulcers    Hypertension    Hypothyroidism    Lung nodule    Neuromuscular disorder (HCC)    restless leg   OSA (obstructive sleep apnea) 2015   has CPAP   Osteopenia    Pneumonia    Seizures (HCC)    x1 10 years ago   Sleep apnea    Thyroid  disease    hypothyroid   Ulcer 1985   Urinary incontinence     Past Surgical History:  Procedure Laterality Date   BASAL CELL CARCINOMA EXCISION  2017   MOHS.  beneath right eye   BRAIN SURGERY  1967   Fractured skull in an accident   CHOLECYSTECTOMY  2001   COLON SURGERY  2001   Complication of gall bladder surgery   COLONOSCOPY     multiple - last 06/2017   COLONOSCOPY WITH  PROPOFOL  N/A 01/17/2023   Procedure: COLONOSCOPY WITH PROPOFOL ;  Surgeon: Kenney Peacemaker, MD;  Location: [facility];  Service: Gastroenterology;  Laterality: N/A;   COSMETIC SURGERY  2017   Basal cell cancer - MOHS surgery   HERNIA REPAIR  2001   HIP SURGERY     JOINT REPLACEMENT  06/2016   Total hip replacement - left hip   PENILE PROSTHESIS IMPLANT  2016   POLYPECTOMY  01/17/2023   Procedure: POLYPECTOMY;  Surgeon: Kenney Peacemaker, MD;  Location: [facility];  Service: Gastroenterology;;   PROSTATECTOMY  2009   SINOSCOPY     SINUS SURGERY WITH INSTATRAK  2014   SQUAMOUS CELL CARCINOMA EXCISION  01/2017   nose   TOOTH EXTRACTION     TOTAL HIP ARTHROPLASTY  06/2016    Family History  Problem Relation Age  of Onset   Ulcerative colitis Mother    Colon polyps Mother    Asthma Mother    Arthritis Mother    Skin cancer Mother        basal cell carcinoma   Kidney failure Mother    Atrial fibrillation Mother    Kidney disease Mother    Obesity Mother    Asthma Father    Arthritis Father    Diabetes Father    Heart attack Father    Hyperlipidemia Father    Hypertension Father    Kidney disease Father    Obesity Father    Diabetes Brother    Arthritis Brother    Hyperlipidemia Brother    Brain cancer Brother        GBM   Cancer Brother    Alcohol abuse Brother    Breast cancer Maternal Grandmother    Cancer Maternal Grandmother    Diabetes Paternal Grandfather    Colon cancer Neg Hx    Esophageal cancer Neg Hx    Liver cancer Neg Hx    Pancreatic cancer Neg Hx    Rectal cancer Neg Hx    Stomach cancer Neg Hx    Crohn's disease Neg Hx     Social History   Socioeconomic History   Marital status: Married    Spouse name: Not on file   Number of children: 3   Years of education: Not on file   Highest education level: Bachelor's degree (e.g., BA, AB, BS)  Occupational History   Not on file  Tobacco Use   Smoking status: Never    Passive exposure: Never   Smokeless tobacco: Never  Vaping Use   Vaping status: Never Used  Substance and Sexual Activity   Alcohol use: Yes    Alcohol/week: 3.0 standard drinks of alcohol    Types: 3 Glasses of wine per week    Comment: occ wine   Drug use: No   Sexual activity: Yes    Birth control/protection: Surgical    Comment: Prostate removal  Other Topics Concern   Not on file  Social History Narrative   Former Engineer, agricultural at [employer], now Environmental health practitioner.    Married   3 daughters- all live locally   Has 7 grandchildren and 1 great grandchild   Enjoys reading, Environmental manager         Lives in a one story home with a bonus room over the garage.   Social Drivers of Health   Financial Resource Strain: Low Risk   (09/07/2023)   Overall Financial Resource Strain (CARDIA)    Difficulty of Paying Living Expenses: Not  hard at all  Food Insecurity: No Food Insecurity (09/07/2023)   Hunger Vital Sign    Worried About Running Out of Food in the Last Year: Never true    Ran Out of Food in the Last Year: Never true  Transportation Needs: No Transportation Needs (09/07/2023)   PRAPARE - Administrator, Civil Service (Medical): No    Lack of Transportation (Non-Medical): No  Physical Activity: Insufficiently Active (09/07/2023)   Exercise Vital Sign    Days of Exercise per Week: 3 days    Minutes of Exercise per Session: 30 min  Stress: No Stress Concern Present (09/07/2023)   [employer] of Occupational Health - Occupational Stress Questionnaire    Feeling of Stress : Not at all  Social Connections: Socially Integrated (09/07/2023)   Social Connection and Isolation Panel [NHANES]    Frequency of Communication with Friends and Family: More than three times a week    Frequency of Social Gatherings with Friends and Family: More than three times a week    Attends Religious Services: More than 4 times per year    Active Member of [employer] or Organizations: Yes    Attends Engineer, structural: More than 4 times per year    Marital Status: Married  Catering manager Violence: Not At Risk (09/07/2023)   Humiliation, Afraid, Rape, and Kick questionnaire    Fear of Current or Ex-Partner: No    Emotionally Abused: No    Physically Abused: No    Sexually Abused: No    Outpatient Medications Prior to Visit  Medication Sig Dispense Refill   amLODipine  (NORVASC ) 5 MG tablet Take 1 tablet (5 mg total) by mouth at bedtime. 90 tablet 1   calcium carbonate (OS-CAL - DOSED IN MG OF ELEMENTAL CALCIUM) 1250 (500 Ca) MG tablet Take 1 tablet by mouth daily.     clindamycin  (CLEOCIN ) 300 MG capsule Take 300 mg by mouth 3 (three) times daily.     diphenhydrAMINE  (BENADRYL ) 25 mg capsule Take 25 mg by mouth  every 6 (six) hours as needed.     docusate sodium (COLACE) 100 MG capsule Take 200 mg by mouth daily.      EPINEPHrine  0.3 mg/0.3 mL IJ SOAJ injection Use as directed for severe allergic reactions 2 each 2   fexofenadine (ALLEGRA) 180 MG tablet Take 180 mg by mouth daily.      fluticasone  (FLONASE ) 50 MCG/ACT nasal spray Place 2 sprays into both nostrils daily. 48 mL 1   fluticasone -salmeterol (WIXELA INHUB) 500-50 MCG/ACT AEPB Inhale 1 puff into the lungs in the morning and at bedtime. 60 each 11   hydrALAZINE  (APRESOLINE ) 25 MG tablet Take 1 tablet (25 mg total) by mouth in the morning and at bedtime. 180 tablet 1   ipratropium-albuterol  (DUONEB) 0.5-2.5 (3) MG/3ML SOLN 1 vial in nebulizer every 4-6 hours while awake for the next 3-5 days for coughing or wheezing. 360 mL 1   levothyroxine  (SYNTHROID ) 75 MCG tablet Take 1 tablet (75 mcg total) by mouth daily before breakfast. 90 tablet 1   Magnesium 250 MG TABS Take by mouth daily.     montelukast  (SINGULAIR ) 10 MG tablet TAKE ONE TABLET ONCE AT NIGHT FOR COUGHING OR WHEEZING 90 tablet 1   Tezepelumab -ekko (TEZSPIRE ) 210 MG/1. SOAJ Inject 210 mg into the skin every 28 (twenty-eight) days. 1.91 mL 11   tiZANidine  (ZANAFLEX ) 2 MG tablet TAKE 1 TO 2 TABLETS(2 TO 4 MG) BY MOUTH AT BEDTIME AS NEEDED FOR  MUSCLE SPASMS 60 tablet 1   albuterol  (VENTOLIN  HFA) 108 (90 Base) MCG/ACT inhaler Inhale 2 puffs into the lungs every 6 (six) hours as needed for wheezing or shortness of breath. 18 g 5   allopurinol  (ZYLOPRIM ) 100 MG tablet TAKE 1 TABLET BY MOUTH TWICE A DAY 180 tablet 1   gabapentin  (NEURONTIN ) 300 MG capsule Take 1 capsule (300 mg total) by mouth at bedtime. 90 capsule 0   lisinopril  (ZESTRIL ) 10 MG tablet Take 1 tablet (10 mg total) by mouth daily. 90 tablet 0   Facility-Administered Medications Prior to Visit  Medication Dose Route Frequency Provider Last Rate Last Admin   tezepelumab -ekko (TEZSPIRE ) 210 MG/1. syringe 210 mg  210 mg  Subcutaneous Q28 days Orelia Binet, MD   210 mg at 08/24/23 0981    Allergies  Allergen Reactions   Baclofen  Shortness Of Breath    Asthma exacerbation   Colchicine Other (See Comments)    Pancreatitis   Doxycycline Hives   Statins Other (See Comments)    Joint stiffness   Avelox [Moxifloxacin Hcl In Nacl] Other (See Comments)    SEIZURE   Bactrim  [Sulfamethoxazole -Trimethoprim ] Hives   Cefdinir  Hives   Losartan      Rash    Oregano [Origanum Oil] Cough   Peanut -Containing Drug Products Swelling    Throat swelling   Adhesive  [Tape] Other (See Comments)    "tears skin"   Penicillins Rash    ROS    See HPI Objective:     Physical Exam Constitutional:      General: He is not in acute distress.    Appearance: He is well-developed.  HENT:     Head: Normocephalic and atraumatic.  Cardiovascular:     Rate and Rhythm: Normal rate and regular rhythm.     Heart sounds: No murmur heard. Pulmonary:     Effort: Pulmonary effort is normal. No respiratory distress.     Breath sounds: Normal breath sounds. No wheezing or rales.  Skin:    General: Skin is warm and dry.  Neurological:     Mental Status: He is alert and oriented to person, place, and time.  Psychiatric:        Behavior: Behavior normal.        Thought Content: Thought content normal.      BP 130/64 (BP Location: Right Arm, Patient Position: Sitting, Cuff Size: Normal)   Pulse (!) 55   Temp 98.7 F (37.1 C) (Oral)   Resp 16   Ht 5\' 6"  (1.676 m)   Wt 232 lb (105.2 kg)   SpO2 96%   BMI 37.45 kg/m  Wt Readings from Last 3 Encounters:  09/08/23 232 lb (105.2 kg)  09/07/23 232 lb (105.2 kg)  07/25/23 232 lb (105.2 kg)       Assessment & Plan:   Problem List Items Addressed This Visit       Unprioritized   Hypertension (Chronic)   BP Readings from Last 3 Encounters:  09/08/23 130/64  08/21/23 (!) 158/94  07/25/23 126/68   At goal. Continue hydralazine , lisinopril  and amlodipine .       Relevant Medications   lisinopril  (ZESTRIL ) 10 MG tablet   Severe persistent asthma - Primary   Stable on tezpire, advair and singulair .      Relevant Medications   tezepelumab -ekko (TEZSPIRE ) 210 MG/1. syringe 210 mg (Start on 09/08/2023  9:30 AM)   lisinopril  (ZESTRIL ) 10 MG tablet   albuterol  (VENTOLIN  HFA) 108 (90 Base) MCG/ACT inhaler  RLS (restless legs syndrome)   Relevant Medications   gabapentin  (NEURONTIN ) 300 MG capsule   OSA (obstructive sleep apnea)   Good compliance with cpap.      Hypothyroid   Lab Results  Component Value Date   TSH 2.75 07/25/2023   Stable on levothyroxine  75mcg once daiy.  Conitnue same.      Hyperlipidemia   Lab Results  Component Value Date   CHOL 180 05/10/2023   HDL 41.10 05/10/2023   LDLCALC 101 (H) 05/10/2023   TRIG 186.0 (H) 05/10/2023   CHOLHDL 4 05/10/2023   Stable without statin.  Continue dietary modification for hypertriglyceridemia.       Relevant Medications   lisinopril  (ZESTRIL ) 10 MG tablet   Other Relevant Orders   Lipid panel   History of prostate cancer   Lab Results  Component Value Date   PSA 0.00 (L) 07/25/2023   PSA 0.00 (L) 07/14/2021   PSA 0.00 Repeated and verified X2. (L) 07/03/2020    PSA is zero.       Gout   Did have a gout attack recently.  Continues allopurinol .       Relevant Medications   allopurinol  (ZYLOPRIM ) 100 MG tablet   Gastroesophageal reflux disease   Stable with occasional use of prn tums.       I have changed Clyde Darling. Longanecker "Martin"'s allopurinol . I am also having him maintain his docusate sodium, fexofenadine, Magnesium, calcium carbonate, tiZANidine , levothyroxine , diphenhydrAMINE , clindamycin , Tezspire , fluticasone , hydrALAZINE , montelukast , amLODipine , EPINEPHrine , ipratropium-albuterol , fluticasone -salmeterol, lisinopril , gabapentin , and albuterol . We will stop administering tezepelumab -ekko. Additionally, we will continue to administer  tezepelumab -ekko.  Meds ordered this encounter  Medications   allopurinol  (ZYLOPRIM ) 100 MG tablet    Sig: Take 1 tablet (100 mg total) by mouth 2 (two) times daily.    Dispense:  180 tablet    Refill:  1    Supervising Provider:   Randie Bustle A [[id number]]   tezepelumab -ekko (TEZSPIRE ) 210 MG/1.91ML syringe 210 mg   lisinopril  (ZESTRIL ) 10 MG tablet    Sig: Take 1 tablet (10 mg total) by mouth daily.    Dispense:  90 tablet    Refill:  0    Supervising Provider:   Randie Bustle A [[id number]]   gabapentin  (NEURONTIN ) 300 MG capsule    Sig: Take 1 capsule (300 mg total) by mouth at bedtime.    Dispense:  90 capsule    Refill:  0    Supervising Provider:   Randie Bustle A [[id number]]   albuterol  (VENTOLIN  HFA) 108 (90 Base) MCG/ACT inhaler    Sig: Inhale 2 puffs into the lungs every 6 (six) hours as needed for wheezing or shortness of breath.    Dispense:  18 g    Refill:  5    Supervising Provider:   Randie Bustle A [[id number]]

## 2023-09-08 NOTE — Assessment & Plan Note (Signed)
 Stable with occasional use of prn tums.

## 2023-09-08 NOTE — Assessment & Plan Note (Signed)
 Good compliance with cpap.

## 2023-09-08 NOTE — Assessment & Plan Note (Signed)
 Lab Results  Component Value Date   PSA 0.00 (L) 07/25/2023   PSA 0.00 (L) 07/14/2021   PSA 0.00 Repeated and verified X2. (L) 07/03/2020    PSA is zero.

## 2023-09-08 NOTE — Assessment & Plan Note (Signed)
 Lab Results  Component Value Date   TSH 2.75 07/25/2023   Stable on levothyroxine  75mcg once daiy.  Conitnue same.

## 2023-09-08 NOTE — Assessment & Plan Note (Signed)
 Did have a gout attack recently.  Continues allopurinol .

## 2023-09-08 NOTE — Assessment & Plan Note (Signed)
 Lab Results  Component Value Date   CHOL 180 05/10/2023   HDL 41.10 05/10/2023   LDLCALC 101 (H) 05/10/2023   TRIG 186.0 (H) 05/10/2023   CHOLHDL 4 05/10/2023   Stable without statin.  Continue dietary modification for hypertriglyceridemia.

## 2023-09-08 NOTE — Assessment & Plan Note (Signed)
 BP Readings from Last 3 Encounters:  09/08/23 130/64  08/21/23 (!) 158/94  07/25/23 126/68   At goal. Continue hydralazine , lisinopril  and amlodipine .

## 2023-09-10 ENCOUNTER — Ambulatory Visit: Payer: Self-pay | Admitting: Family

## 2023-09-11 ENCOUNTER — Other Ambulatory Visit: Payer: Self-pay

## 2023-09-12 ENCOUNTER — Other Ambulatory Visit: Payer: Self-pay | Admitting: Pharmacy Technician

## 2023-09-12 ENCOUNTER — Other Ambulatory Visit: Payer: Self-pay

## 2023-09-12 NOTE — Progress Notes (Signed)
 Specialty Pharmacy Refill Coordination Note  Michael [name] is a 73 y.o. male contacted today regarding refills of specialty medication(s) Tezepelumab -ekko (TEZSPIRE )   Patient requested Courier to Provider Office   Delivery date: 09/20/23   Verified address: [facility] [address] [place], [place] [address]   Medication will be filled on 09/19/23.

## 2023-09-21 ENCOUNTER — Ambulatory Visit

## 2023-09-21 ENCOUNTER — Ambulatory Visit (INDEPENDENT_AMBULATORY_CARE_PROVIDER_SITE_OTHER)

## 2023-09-21 DIAGNOSIS — J4550 Severe persistent asthma, uncomplicated: Secondary | ICD-10-CM

## 2023-09-22 ENCOUNTER — Other Ambulatory Visit: Payer: Self-pay | Admitting: Medical Genetics

## 2023-09-22 DIAGNOSIS — Z006 Encounter for examination for normal comparison and control in clinical research program: Secondary | ICD-10-CM

## 2023-10-02 LAB — GENECONNECT MOLECULAR SCREEN: Genetic Analysis Overall Interpretation: NEGATIVE

## 2023-10-05 ENCOUNTER — Other Ambulatory Visit: Payer: Self-pay | Admitting: Family

## 2023-10-12 ENCOUNTER — Other Ambulatory Visit: Payer: Self-pay

## 2023-10-12 ENCOUNTER — Other Ambulatory Visit: Payer: Self-pay | Admitting: Pharmacy Technician

## 2023-10-12 NOTE — Progress Notes (Signed)
 Specialty Pharmacy Refill Coordination Note  Michael [name] is a 73 y.o. male assessed today regarding refills of clinic administered specialty medication(s) Tezepelumab -ekko (TEZSPIRE )   Clinic requested Courier to Provider Office   Delivery date: 10/16/23   Verified address: [facility] [address] [place], [place] [address]   Medication will be filled on 10/13/23.   Sent [facility] regarding clear bags.

## 2023-10-20 ENCOUNTER — Ambulatory Visit (INDEPENDENT_AMBULATORY_CARE_PROVIDER_SITE_OTHER)

## 2023-10-20 DIAGNOSIS — J4550 Severe persistent asthma, uncomplicated: Secondary | ICD-10-CM

## 2023-10-25 ENCOUNTER — Ambulatory Visit (INDEPENDENT_AMBULATORY_CARE_PROVIDER_SITE_OTHER): Admitting: Family

## 2023-10-25 VITALS — BP 137/63 | HR 61 | Temp 98.4°F | Resp 16 | Ht 66.0 in | Wt 228.0 lb

## 2023-10-25 DIAGNOSIS — M7918 Myalgia, other site: Secondary | ICD-10-CM | POA: Diagnosis not present

## 2023-10-25 NOTE — Patient Instructions (Signed)
 VISIT SUMMARY:  Today, you were seen for right-sided chest pain that has been bothering you for about a week and a half. The pain is likely due to muscle strain from gardening. We also reviewed your asthma management and general health maintenance.  YOUR PLAN:  MUSCULOSKELETAL CHEST PAIN: You have been experiencing right-sided chest pain, likely due to muscle strain from gardening. -Use over-the-counter lidocaine patches for pain relief. -Take acetaminophen as needed for additional pain management.  ASTHMA: Your asthma can be worsened by NSAIDs like aspirin. -Avoid NSAIDs to prevent asthma exacerbation.  GENERAL HEALTH MAINTENANCE: Your blood pressure is well-controlled, and you have received all available shingles vaccinations. -Continue with your current health maintenance routine.  FOLLOW-UP: You have a routine follow-up scheduled. -Your next appointment is on November 21.

## 2023-10-25 NOTE — Progress Notes (Signed)
 Subjective:     Patient ID: Michael [name], male    DOB: 1950/04/29, 73 y.o.   MRN: [id number]  Chief Complaint  Patient presents with   Flank Pain    Patient complains of right flank pain    Flank Pain    Discussed the use of AI scribe software for clinical note transcription with the patient, who gave verbal consent to proceed.  History of Present Illness  Michael [name] is a 73 yr old male with hx of asthma who presents with right-sided chest pain.  He experiences a squeezing pain on the right side of his chest for about a week and a half, worsening significantly four to five days ago. Notes that he did recently pull out some bushes at his home. It began while gardening in the heat, initially feeling like a stitch in his side. The pain is deep, underneath the rib cage, and not affected by deep breaths. There are no recent falls or injuries.  He has asthma and cannot use NSAIDs due to exacerbation of symptoms.   There is no rash, and he is familiar with shingles, having had it before and received all available vaccinations.     Health Maintenance Due  Topic Date Due   COVID-19 Vaccine (7 - 2024-25 season) 12/25/2022   DTaP/Tdap/Td (2 - Td or Tdap) 08/16/2023    Past Medical History:  Diagnosis Date   Allergy     Arthritis    Asthma    Blood transfusion without reported diagnosis    as a child   Cancer (HCC)    basal cell and squamous cell carcinoma   Cancer (HCC)    prostate   Cataract 2023   Clotting disorder (HCC)    Colon polyps    COPD (chronic obstructive pulmonary disease) (HCC) 1975   Difficult airway for intubation 09/28/2022   2015 at [facility]   GERD (gastroesophageal reflux disease)    Gout    High cholesterol    History of hepatitis    due to drug interaction?   History of pancreatitis 2016   ?due to colchicine   History of pulmonary embolus (PE) 2016   History of stomach ulcers    Hypertension    Hypothyroidism    Lung nodule     Neuromuscular disorder (HCC)    restless leg   OSA (obstructive sleep apnea) 2015   has CPAP   Osteopenia    Pneumonia    Seizures (HCC)    x1 10 years ago   Sleep apnea    Thyroid  disease    hypothyroid   Ulcer 1985   Urinary incontinence     Past Surgical History:  Procedure Laterality Date   BASAL CELL CARCINOMA EXCISION  2017   MOHS.  beneath right eye   BRAIN SURGERY  1967   Fractured skull in an accident   CHOLECYSTECTOMY  2001   COLON SURGERY  2001   Complication of gall bladder surgery   COLONOSCOPY     multiple - last 06/2017   COLONOSCOPY WITH PROPOFOL  N/A 01/17/2023   Procedure: COLONOSCOPY WITH PROPOFOL ;  Surgeon: Avram Lupita BRAVO, MD;  Location: [facility];  Service: Gastroenterology;  Laterality: N/A;   COSMETIC SURGERY  2017   Basal cell cancer - MOHS surgery   HERNIA REPAIR  2001   HIP SURGERY     JOINT REPLACEMENT  06/2016   Total hip replacement - left hip   PENILE PROSTHESIS IMPLANT  2016   POLYPECTOMY  01/17/2023   Procedure: POLYPECTOMY;  Surgeon: Avram Lupita BRAVO, MD;  Location: [facility];  Service: Gastroenterology;;   PROSTATECTOMY  2009   SINOSCOPY     SINUS SURGERY WITH INSTATRAK  2014   SQUAMOUS CELL CARCINOMA EXCISION  01/2017   nose   TOOTH EXTRACTION     TOTAL HIP ARTHROPLASTY  06/2016    Family History  Problem Relation Age of Onset   Ulcerative colitis Mother    Colon polyps Mother    Asthma Mother    Arthritis Mother    Skin cancer Mother        basal cell carcinoma   Kidney failure Mother    Atrial fibrillation Mother    Kidney disease Mother    Obesity Mother    Asthma Father    Arthritis Father    Diabetes Father    Heart attack Father    Hyperlipidemia Father    Hypertension Father    Kidney disease Father    Obesity Father    Diabetes Brother    Arthritis Brother    Hyperlipidemia Brother    Brain cancer Brother        GBM   Cancer Brother    Alcohol abuse Brother    Breast cancer Maternal Grandmother     Cancer Maternal Grandmother    Diabetes Paternal Grandfather    Colon cancer Neg Hx    Esophageal cancer Neg Hx    Liver cancer Neg Hx    Pancreatic cancer Neg Hx    Rectal cancer Neg Hx    Stomach cancer Neg Hx    Crohn's disease Neg Hx     Social History   Socioeconomic History   Marital status: Married    Spouse name: Not on file   Number of children: 3   Years of education: Not on file   Highest education level: Bachelor's degree (e.g., BA, AB, BS)  Occupational History   Not on file  Tobacco Use   Smoking status: Never    Passive exposure: Never   Smokeless tobacco: Never  Vaping Use   Vaping status: Never Used  Substance and Sexual Activity   Alcohol use: Yes    Alcohol/week: 3.0 standard drinks of alcohol    Types: 3 Glasses of wine per week    Comment: occ wine   Drug use: No   Sexual activity: Yes    Birth control/protection: Surgical    Comment: Prostate removal  Other Topics Concern   Not on file  Social History Narrative   Former Engineer, agricultural at [employer], now Environmental health practitioner.    Married   3 daughters- all live locally   Has 7 grandchildren and 1 great grandchild   Enjoys reading, Environmental manager         Lives in a one story home with a bonus room over the garage.   Social Drivers of Corporate investment banker Strain: Low Risk  (09/07/2023)   Overall Financial Resource Strain (CARDIA)    Difficulty of Paying Living Expenses: Not hard at all  Food Insecurity: No Food Insecurity (09/07/2023)   Hunger Vital Sign    Worried About Running Out of Food in the Last Year: Never true    Ran Out of Food in the Last Year: Never true  Transportation Needs: No Transportation Needs (09/07/2023)   PRAPARE - Administrator, Civil Service (Medical): No    Lack of Transportation (Non-Medical): No  Physical Activity: Insufficiently Active (09/07/2023)  Exercise Vital Sign    Days of Exercise per Week: 3 days    Minutes of Exercise per  Session: 30 min  Stress: No Stress Concern Present (09/07/2023)   [employer] of Occupational Health - Occupational Stress Questionnaire    Feeling of Stress : Not at all  Social Connections: Socially Integrated (09/07/2023)   Social Connection and Isolation Panel    Frequency of Communication with Friends and Family: More than three times a week    Frequency of Social Gatherings with Friends and Family: More than three times a week    Attends Religious Services: More than 4 times per year    Active Member of [employer] or Organizations: Yes    Attends Engineer, structural: More than 4 times per year    Marital Status: Married  Catering manager Violence: Not At Risk (09/07/2023)   Humiliation, Afraid, Rape, and Kick questionnaire    Fear of Current or Ex-Partner: No    Emotionally Abused: No    Physically Abused: No    Sexually Abused: No    Outpatient Medications Prior to Visit  Medication Sig Dispense Refill   albuterol  (VENTOLIN  HFA) 108 (90 Base) MCG/ACT inhaler Inhale 2 puffs into the lungs every 6 (six) hours as needed for wheezing or shortness of breath. 18 g 5   allopurinol  (ZYLOPRIM ) 100 MG tablet Take 1 tablet (100 mg total) by mouth 2 (two) times daily. 180 tablet 1   amLODipine  (NORVASC ) 5 MG tablet Take 1 tablet (5 mg total) by mouth at bedtime. 90 tablet 1   calcium carbonate (OS-CAL - DOSED IN MG OF ELEMENTAL CALCIUM) 1250 (500 Ca) MG tablet Take 1 tablet by mouth daily.     clindamycin  (CLEOCIN ) 300 MG capsule Take 300 mg by mouth 3 (three) times daily.     diphenhydrAMINE  (BENADRYL ) 25 mg capsule Take 25 mg by mouth every 6 (six) hours as needed.     docusate sodium (COLACE) 100 MG capsule Take 200 mg by mouth daily.      EPINEPHrine  0.3 mg/0.3 mL IJ SOAJ injection Use as directed for severe allergic reactions 2 each 2   fexofenadine (ALLEGRA) 180 MG tablet Take 180 mg by mouth daily.      fluticasone  (FLONASE ) 50 MCG/ACT nasal spray Place 2 sprays into both  nostrils daily. 48 mL 1   fluticasone -salmeterol (WIXELA INHUB) 500-50 MCG/ACT AEPB Inhale 1 puff into the lungs in the morning and at bedtime. 60 each 11   gabapentin  (NEURONTIN ) 300 MG capsule Take 1 capsule (300 mg total) by mouth at bedtime. 90 capsule 0   hydrALAZINE  (APRESOLINE ) 25 MG tablet Take 1 tablet (25 mg total) by mouth in the morning and at bedtime. 180 tablet 1   ipratropium-albuterol  (DUONEB) 0.5-2.5 (3) MG/3ML SOLN 1 vial in nebulizer every 4-6 hours while awake for the next 3-5 days for coughing or wheezing. 360 mL 1   levothyroxine  (SYNTHROID ) 75 MCG tablet TAKE 1 TABLET BY MOUTH DAILY BEFORE BREAKFAST. 90 tablet 1   lisinopril  (ZESTRIL ) 10 MG tablet Take 1 tablet (10 mg total) by mouth daily. 90 tablet 0   Magnesium 250 MG TABS Take by mouth daily.     montelukast  (SINGULAIR ) 10 MG tablet TAKE ONE TABLET ONCE AT NIGHT FOR COUGHING OR WHEEZING 90 tablet 1   Tezepelumab -ekko (TEZSPIRE ) 210 MG/1. SOAJ Inject 210 mg into the skin every 28 (twenty-eight) days. 1.91 mL 11   tiZANidine  (ZANAFLEX ) 2 MG tablet TAKE 1 TO 2 TABLETS(2  TO 4 MG) BY MOUTH AT BEDTIME AS NEEDED FOR MUSCLE SPASMS 60 tablet 1   Facility-Administered Medications Prior to Visit  Medication Dose Route Frequency Provider Last Rate Last Admin   tezepelumab -ekko (TEZSPIRE ) 210 MG/1. syringe 210 mg  210 mg Subcutaneous Q28 days    210 mg at 10/20/23 0920    Allergies  Allergen Reactions   Baclofen  Shortness Of Breath    Asthma exacerbation   Colchicine Other (See Comments)    Pancreatitis   Doxycycline Hives   Statins Other (See Comments)    Joint stiffness   Avelox [Moxifloxacin Hcl In Nacl] Other (See Comments)    SEIZURE   Bactrim  [Sulfamethoxazole -Trimethoprim ] Hives   Cefdinir  Hives   Losartan      Rash    Oregano [Origanum Oil] Cough   Peanut -Containing Drug Products Swelling    Throat swelling   Adhesive  [Tape] Other (See Comments)    tears skin   Penicillins Rash    Review of  Systems  Genitourinary:  Positive for flank pain.       Objective:    Physical Exam Constitutional:      General: He is not in acute distress.    Appearance: He is well-developed.  HENT:     Head: Normocephalic and atraumatic.  Cardiovascular:     Rate and Rhythm: Normal rate and regular rhythm.     Heart sounds: No murmur heard. Pulmonary:     Effort: Pulmonary effort is normal. No respiratory distress.     Breath sounds: Normal breath sounds. No wheezing or rales.  Musculoskeletal:     Comments: Reproducible tenderness to palpation noted right lateral chest/beneath right breast, no swelling  Skin:    General: Skin is warm and dry.     Comments: No rash noted on right lateral chest  Neurological:     Mental Status: He is alert and oriented to person, place, and time.  Psychiatric:        Behavior: Behavior normal.        Thought Content: Thought content normal.      BP 137/63 (BP Location: Right Arm, Patient Position: Sitting, Cuff Size: Large)   Pulse 61   Temp 98.4 F (36.9 C) (Oral)   Resp 16   Ht 5' 6 (1.676 m)   Wt 228 lb (103.4 kg)   SpO2 97%   BMI 36.80 kg/m  Wt Readings from Last 3 Encounters:  10/25/23 228 lb (103.4 kg)  09/08/23 232 lb (105.2 kg)  09/07/23 232 lb (105.2 kg)       Assessment & Plan:   Problem List Items Addressed This Visit       Unprioritized   Musculoskeletal pain - Primary   New.  Intermittent right-sided chest pain likely musculoskeletal from gardening. NSAIDs contraindicated due to asthma. - Recommend over-the-counter lidocaine patches for pain relief. - Use acetaminophen for additional pain management.       I am having Ryzen Deady. Eden Martin maintain his docusate sodium, fexofenadine, Magnesium, calcium carbonate, tiZANidine , diphenhydrAMINE , clindamycin , Tezspire , fluticasone , hydrALAZINE , montelukast , amLODipine , EPINEPHrine , ipratropium-albuterol , fluticasone -salmeterol, allopurinol , lisinopril , gabapentin ,  albuterol , and levothyroxine . We will continue to administer tezepelumab -ekko.  No orders of the defined types were placed in this encounter.

## 2023-10-25 NOTE — Assessment & Plan Note (Signed)
 New.  Intermittent right-sided chest pain likely musculoskeletal from gardening. NSAIDs contraindicated due to asthma. - Recommend over-the-counter lidocaine patches for pain relief. - Use acetaminophen for additional pain management.

## 2023-10-26 ENCOUNTER — Other Ambulatory Visit: Payer: Self-pay

## 2023-11-03 ENCOUNTER — Other Ambulatory Visit: Payer: Self-pay

## 2023-11-10 ENCOUNTER — Other Ambulatory Visit: Payer: Self-pay

## 2023-11-10 NOTE — Progress Notes (Signed)
 Specialty Pharmacy Refill Coordination Note  Michael [name] is a 73 y.o. male assessed today regarding refills of clinic administered specialty medication(s) Tezepelumab -ekko (TEZSPIRE )   Clinic requested Courier to Provider Office   Delivery date: 11/14/23   Injection date: 11/20/23  Verified address: [facility] [address]. [place], [place] [address]   Medication will be filled on 11/13/23.

## 2023-11-13 ENCOUNTER — Other Ambulatory Visit: Payer: Self-pay

## 2023-11-15 DIAGNOSIS — Z129 Encounter for screening for malignant neoplasm, site unspecified: Secondary | ICD-10-CM | POA: Diagnosis not present

## 2023-11-15 DIAGNOSIS — Z85828 Personal history of other malignant neoplasm of skin: Secondary | ICD-10-CM | POA: Diagnosis not present

## 2023-11-15 DIAGNOSIS — L570 Actinic keratosis: Secondary | ICD-10-CM | POA: Diagnosis not present

## 2023-11-15 DIAGNOSIS — Z08 Encounter for follow-up examination after completed treatment for malignant neoplasm: Secondary | ICD-10-CM | POA: Diagnosis not present

## 2023-11-15 DIAGNOSIS — D1801 Hemangioma of skin and subcutaneous tissue: Secondary | ICD-10-CM | POA: Diagnosis not present

## 2023-11-15 DIAGNOSIS — D692 Other nonthrombocytopenic purpura: Secondary | ICD-10-CM | POA: Diagnosis not present

## 2023-11-15 DIAGNOSIS — D225 Melanocytic nevi of trunk: Secondary | ICD-10-CM | POA: Diagnosis not present

## 2023-11-15 DIAGNOSIS — L820 Inflamed seborrheic keratosis: Secondary | ICD-10-CM | POA: Diagnosis not present

## 2023-11-15 DIAGNOSIS — L821 Other seborrheic keratosis: Secondary | ICD-10-CM | POA: Diagnosis not present

## 2023-11-17 ENCOUNTER — Other Ambulatory Visit (HOSPITAL_COMMUNITY): Payer: Self-pay

## 2023-11-20 ENCOUNTER — Encounter: Payer: Self-pay | Admitting: Internal Medicine

## 2023-11-20 ENCOUNTER — Ambulatory Visit: Admitting: Internal Medicine

## 2023-11-20 VITALS — HR 71 | Temp 94.3°F | Ht 66.0 in | Wt 231.1 lb

## 2023-11-20 DIAGNOSIS — J330 Polyp of nasal cavity: Secondary | ICD-10-CM | POA: Diagnosis not present

## 2023-11-20 DIAGNOSIS — Z881 Allergy status to other antibiotic agents status: Secondary | ICD-10-CM

## 2023-11-20 DIAGNOSIS — J3089 Other allergic rhinitis: Secondary | ICD-10-CM | POA: Diagnosis not present

## 2023-11-20 DIAGNOSIS — K219 Gastro-esophageal reflux disease without esophagitis: Secondary | ICD-10-CM | POA: Diagnosis not present

## 2023-11-20 DIAGNOSIS — G4733 Obstructive sleep apnea (adult) (pediatric): Secondary | ICD-10-CM | POA: Diagnosis not present

## 2023-11-20 DIAGNOSIS — J4550 Severe persistent asthma, uncomplicated: Secondary | ICD-10-CM | POA: Diagnosis not present

## 2023-11-20 DIAGNOSIS — T7800XA Anaphylactic reaction due to unspecified food, initial encounter: Secondary | ICD-10-CM

## 2023-11-20 DIAGNOSIS — Z8739 Personal history of other diseases of the musculoskeletal system and connective tissue: Secondary | ICD-10-CM

## 2023-11-20 DIAGNOSIS — T7800XD Anaphylactic reaction due to unspecified food, subsequent encounter: Secondary | ICD-10-CM

## 2023-11-20 DIAGNOSIS — J302 Other seasonal allergic rhinitis: Secondary | ICD-10-CM

## 2023-11-20 NOTE — Patient Instructions (Addendum)
 Asthma: severe persistent, controlled   -Continue current regimen of Wixela 500/50 mcg 1 puff twice daily, Montelukast .10mg  at night  - Rescue Inhaler: Duonebs . Use  every 4-6 hours as needed for chest tightness, wheezing, or coughing.  Can also use 15 minutes prior to exercise if you have symptoms with activity. - Asthma is not controlled if:  - Symptoms are occurring >2 times a week OR  - >2 times a month nighttime awakenings  - You are requiring systemic steroids (prednisone /steroid injections) more than once per year  - Your require hospitalization for your asthma.  - Please call the clinic to schedule a follow up if these symptoms arise  - Continue Tezspire  every 4 weeks  Drug Allergies. -Penicillin testing on 05/16/23 positive  -Continue to avoid all penicillins -Consider cefdinir  challenge    Allergic Rhinitis with Nasal Polyps, improved  -Continue Flonase .1 spray per nostril twice daily  -Continue Allegra 180mg  daily   Food Allergy : Peanut , walnut/pecans, oregano  Patient reports rotation of symptoms every seven years and currently in an off cycle. Experiences coughing and wheezing with exposure. -Allergy  test (05/16/23): negative to tree nut and peanut      Reflux  - Continue dietary and lifestyle modifications   CPAP  - you need to find a sleep medicine certified provider to manage the CPAP.   Follow up: 6 months   Thank you so much for letting me partake in your care today.  Don't hesitate to reach out if you have any additional concerns!  Hargis Springer, MD  [facility] [place], [place]

## 2023-11-20 NOTE — Progress Notes (Signed)
 FOLLOW UP Date of Service/Encounter:  11/20/23  Subjective:  Michael [name] (DOB: Jun 13, 1950) is a 73 y.o. male who [facility] in re-evaluation of the following: asthma, CRSwNP, multiple antibiotics, food allergy , drug allergy , urticaria  History obtained from: chart review and patient.  For Review, LV was on 08/21/23  with Dr. Lorin seen for routine follow-up. See below for summary of history and diagnostics.   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 History of successful treatment with Dupixent , discontinued due to cost.  - OCS 2/25  -Tezspire  started 2/25 - RX: wixela 500, montelukast   - mod obstr spirometry (06/22/23): ratio 67, 1.80L, 65% FEV1 (pre),  Allergic Rhinitis with nasal polyps  Chronic symptoms despite use of Flonase . History of nasal polyp removal. Previous successful treatment with Dupixent . Food Allergy : Peanut , tree nut, oregano  Patient reports rotation of symptoms every seven years and currently in an off cycle. Experiences coughing and wheezing with exposure. - SPT select foods (05/16/23): negative to peanut  and tree nut   Multiple drug allergies : Multiple reported drug allergies and adverse reactions, including penicillin, baclofen , Avelox, Bactrim , cefdinir , and doxycycline. - PCN testing (05/16/23): positive  - Plan for cefdinir  challenge  Urticaria:  - labs (not done ) Other: GERD  --------------------------------------------------- Today presents for follow-up. Discussed the use of AI scribe software for clinical note transcription with the patient, who gave verbal consent to proceed.  History of Present Illness Michael [name] is a 73 year old male with asthma who presents for follow-up  regarding his asthma management with Tezspire .  Asthma control and biologic therapy - Asthma management significantly improved with Tezspire , described as a 'life changing experience'. - Experienced worsening of asthma symptoms when Tezspire  wore off last Thursday; symptoms resolved after receiving next dose on Friday. - Occasional use of rescue inhaler. - Continues Wixela and Singulair  for asthma control.  Upper respiratory symptoms - Slight improvement in nasal congestion and sense of smell.. - Quality of life improved, able to walk and use treadmill.  Adverse effects of tezspire  - Experiences fatigue and joint pain for a couple of days post-injection, which then subside. - History of arthritis, particularly in the knee, which may contribute to joint pain.  Allergic rhinitis and medication use - Continues Flonase  and Allegra for allergic rhinitis. - Stopped Benadryl  after nightly use for five years.  Food allergies - History of food allergy  to peanuts, despite negative testing, causing prolonged coughing fits. - Avoids walnuts due to mild allergy  with slight tongue swelling. - Does not consume pecans, but no allergy  to other tree nuts such as almonds.  Gastroesophageal reflux disease - Reflux well-controlled by eating early and avoiding late meals.  Obstructive sleep apnea - Considering obtaining a smaller CPAP machine for travel. - Current CPAP settings well-managed for six to seven years.     All medications reviewed by clinical staff and updated in chart. No new pertinent medical or surgical history except as noted in HPI.  ROS: All others negative except as noted per HPI.   Objective:  Pulse 71   Temp (!) 94.3 F (34.6 C) (Temporal)   Ht 5' 6 (1.676 m)   Wt 231 lb 1.6 oz (104.8 kg)   SpO2 96%   BMI 37.30 kg/m  Body mass index is 37.3 kg/m. Physical Exam: General Appearance:  Alert, cooperative, no distress,  appears stated age  Head:  Normocephalic, without  obvious abnormality, atraumatic  Eyes:  Conjunctiva clear, EOM's intact  Ears EACs normal bilaterally  Nose: Nares normal, Pink mucosa, hypertrophic turbinates, no visible anterior polyps, and septum midline  Throat: Lips, tongue normal; teeth and gums normal, normal posterior oropharynx  Neck: Supple, symmetrical  Lungs:   clear to auscultation bilaterally, Respirations unlabored, no coughing  Heart:  regular rate and rhythm and no murmur, Appears well perfused  Extremities: No edema  Skin: Skin color, texture, turgor normal and no rashes or lesions on visualized portions of skin  Neurologic: No gross deficits   Labs:  Lab Orders  No laboratory test(s) ordered today      Assessment/Plan      Patient Instructions  Asthma: severe persistent, controlled   -Continue current regimen of Wixela 500/50 mcg 1 puff twice daily, Montelukast .10mg  at night  - Rescue Inhaler: Duonebs . Use  every 4-6 hours as needed for chest tightness, wheezing, or coughing.  Can also use 15 minutes prior to exercise if you have symptoms with activity. - Asthma is not controlled if:  - Symptoms are occurring >2 times a week OR  - >2 times a month nighttime awakenings  - You are requiring systemic steroids (prednisone /steroid injections) more than once per year  - Your require hospitalization for your asthma.  - Please call the clinic to schedule a follow up if these symptoms arise  - Continue Tezspire  every 4 weeks  Drug Allergies. -Penicillin testing on 05/16/23 positive  -Continue to avoid all penicillins -Consider cefdinir  challenge    Allergic Rhinitis with Nasal Polyps, improved  -Continue Flonase .1 spray per nostril twice daily  -Continue Allegra 180mg  daily   Food Allergy : Peanut , walnut/pecans, oregano  Patient reports rotation of symptoms every seven years and currently in an off cycle. Experiences coughing and wheezing with exposure. -Allergy  test (05/16/23): negative to tree nut and  peanut      Reflux  - Continue dietary and lifestyle modifications   CPAP  - you need to find a sleep medicine certified provider to manage the CPAP.   Follow up: 6 months   Thank you so much for letting me partake in your care today.  Don't hesitate to reach out if you have any additional concerns!  Hargis Springer, MD  [facility] [place], [place]   Thank you so much for letting me partake in your care today.  Don't hesitate to reach out if you have any additional concerns!   Other:    Thank you so much for letting me partake in your care today.  Don't hesitate to reach out if you have any additional concerns!  Hargis Springer, MD  [facility] [place], [place]

## 2023-11-29 ENCOUNTER — Other Ambulatory Visit: Payer: Self-pay | Admitting: Family

## 2023-12-04 ENCOUNTER — Other Ambulatory Visit: Payer: Self-pay

## 2023-12-08 ENCOUNTER — Other Ambulatory Visit (HOSPITAL_COMMUNITY): Payer: Self-pay

## 2023-12-08 ENCOUNTER — Other Ambulatory Visit: Payer: Self-pay

## 2023-12-08 NOTE — Progress Notes (Signed)
.  Specialty Pharmacy Refill Coordination Note  Michael [name] is a 73 y.o. male contacted today regarding refills of specialty medication(s) Tezepelumab -ekko (TEZSPIRE )   Patient requested Delivery   Delivery date: 12/13/23   Verified address: Patient address [address]  [place] [address]   Medication will be filled on 08.19.25.

## 2023-12-12 ENCOUNTER — Other Ambulatory Visit: Payer: Self-pay

## 2024-01-02 ENCOUNTER — Other Ambulatory Visit: Payer: Self-pay

## 2024-01-02 ENCOUNTER — Other Ambulatory Visit: Payer: Self-pay | Admitting: Internal Medicine

## 2024-01-02 NOTE — Progress Notes (Signed)
 Specialty Pharmacy Refill Coordination Note  Michael [name] is a 73 y.o. male contacted today regarding refills of specialty medication(s) Tezepelumab -ekko (TEZSPIRE )   Patient requested Delivery   Delivery date: 01/10/24   Verified address: [address]  [place] [address]   Medication will be filled on 01/09/24.  This fill date is pending response to refill request from provider. Patient is aware and if they have not received fill by intended date they must follow up with pharmacy.

## 2024-01-03 ENCOUNTER — Other Ambulatory Visit: Payer: Self-pay

## 2024-01-03 ENCOUNTER — Other Ambulatory Visit: Payer: Self-pay | Admitting: Internal Medicine

## 2024-01-03 ENCOUNTER — Other Ambulatory Visit: Payer: Self-pay | Admitting: Family

## 2024-01-03 ENCOUNTER — Other Ambulatory Visit (HOSPITAL_BASED_OUTPATIENT_CLINIC_OR_DEPARTMENT_OTHER): Payer: Self-pay | Admitting: Primary Care

## 2024-01-03 DIAGNOSIS — I10 Essential (primary) hypertension: Secondary | ICD-10-CM

## 2024-01-03 MED ORDER — TEZSPIRE 210 MG/1.91ML ~~LOC~~ SOAJ
210.0000 mg | SUBCUTANEOUS | 11 refills | Status: AC
  Filled 2024-01-03: qty 1.91, 28d supply, fill #0
  Filled 2024-01-30: qty 1.91, 28d supply, fill #1
  Filled 2024-02-22: qty 1.91, 28d supply, fill #2
  Filled 2024-03-25: qty 1.91, 28d supply, fill #3
  Filled 2024-04-22: qty 1.91, 28d supply, fill #4
  Filled 2024-05-17: qty 1.91, 28d supply, fill #5

## 2024-01-04 ENCOUNTER — Other Ambulatory Visit: Payer: Self-pay | Admitting: Family

## 2024-01-04 DIAGNOSIS — J4550 Severe persistent asthma, uncomplicated: Secondary | ICD-10-CM

## 2024-01-06 ENCOUNTER — Other Ambulatory Visit: Payer: Self-pay | Admitting: Family

## 2024-01-06 DIAGNOSIS — G2581 Restless legs syndrome: Secondary | ICD-10-CM

## 2024-01-09 ENCOUNTER — Other Ambulatory Visit: Payer: Self-pay

## 2024-01-29 ENCOUNTER — Encounter (INDEPENDENT_AMBULATORY_CARE_PROVIDER_SITE_OTHER): Payer: Self-pay

## 2024-01-30 ENCOUNTER — Other Ambulatory Visit: Payer: Self-pay

## 2024-01-30 ENCOUNTER — Other Ambulatory Visit (HOSPITAL_COMMUNITY): Payer: Self-pay

## 2024-01-30 NOTE — Progress Notes (Signed)
 Specialty Pharmacy Refill Coordination Note  MyChart Questionnaire Submission  Michael [name] is a 73 y.o. male contacted today regarding refills of specialty medication(s) Tezspire .  Doses on hand: 0   Injection date: (Patient-Rptd) 02/09/24  Patient requested: (Patient-Rptd) Delivery   Delivery date: 02/01/24  Verified address: [address] [place] [address]  Medication will be filled on 01/31/24.

## 2024-01-31 ENCOUNTER — Other Ambulatory Visit: Payer: Self-pay

## 2024-02-12 ENCOUNTER — Telehealth: Payer: Self-pay | Admitting: Family

## 2024-02-12 DIAGNOSIS — R911 Solitary pulmonary nodule: Secondary | ICD-10-CM

## 2024-02-12 NOTE — Telephone Encounter (Signed)
 See mychart.

## 2024-02-16 ENCOUNTER — Ambulatory Visit (HOSPITAL_BASED_OUTPATIENT_CLINIC_OR_DEPARTMENT_OTHER)
Admission: RE | Admit: 2024-02-16 | Discharge: 2024-02-16 | Disposition: A | Discharge status: Home / Self Care | Source: Ambulatory Visit | Attending: Family | Admitting: Family

## 2024-02-16 DIAGNOSIS — I2510 Atherosclerotic heart disease of native coronary artery without angina pectoris: Secondary | ICD-10-CM | POA: Diagnosis not present

## 2024-02-16 DIAGNOSIS — I700 Atherosclerosis of aorta: Secondary | ICD-10-CM | POA: Diagnosis not present

## 2024-02-16 DIAGNOSIS — R911 Solitary pulmonary nodule: Secondary | ICD-10-CM | POA: Insufficient documentation

## 2024-02-19 ENCOUNTER — Ambulatory Visit: Payer: Self-pay | Admitting: Family

## 2024-02-22 ENCOUNTER — Other Ambulatory Visit: Payer: Self-pay

## 2024-02-22 NOTE — Progress Notes (Signed)
 Specialty Pharmacy Ongoing Clinical Assessment Note  Michael [name] is a 73 y.o. male who is being followed by the specialty pharmacy service for RxSp Asthma/COPD   Patient's specialty medication(s) reviewed today: Tezepelumab -ekko (Tezspire )   Missed doses in the last 4 weeks: 0   Patient/Caregiver did not have any additional questions or concerns.   Therapeutic benefit summary: Patient is achieving benefit   Adverse events/side effects summary: No adverse events/side effects   Patient's therapy is appropriate to: Continue    Goals Addressed             This Visit's Progress    Minimize recurrence of flares       Patient is on track. Patient will maintain adherence and avoid flare triggers. Patient reports therapy is working very well for him and he is feeling great.           Follow up: 12 months  Delon CHRISTELLA Brow Specialty Pharmacist

## 2024-02-22 NOTE — Progress Notes (Signed)
 Specialty Pharmacy Refill Coordination Note  Michael [name] is a 73 y.o. male contacted today regarding refills of specialty medication(s) Tezepelumab -ekko (Tezspire )   Patient requested Delivery   Delivery date: 03/05/24   Verified address: [address], [place], [place] [address]   Medication will be filled on: 03/04/24

## 2024-02-26 DIAGNOSIS — J0100 Acute maxillary sinusitis, unspecified: Secondary | ICD-10-CM | POA: Diagnosis not present

## 2024-02-26 DIAGNOSIS — R519 Headache, unspecified: Secondary | ICD-10-CM | POA: Diagnosis not present

## 2024-03-04 ENCOUNTER — Other Ambulatory Visit: Payer: Self-pay

## 2024-03-06 DIAGNOSIS — L578 Other skin changes due to chronic exposure to nonionizing radiation: Secondary | ICD-10-CM | POA: Diagnosis not present

## 2024-03-06 DIAGNOSIS — Z85828 Personal history of other malignant neoplasm of skin: Secondary | ICD-10-CM | POA: Diagnosis not present

## 2024-03-06 DIAGNOSIS — W908XXS Exposure to other nonionizing radiation, sequela: Secondary | ICD-10-CM | POA: Diagnosis not present

## 2024-03-06 DIAGNOSIS — L570 Actinic keratosis: Secondary | ICD-10-CM | POA: Diagnosis not present

## 2024-03-06 DIAGNOSIS — Z08 Encounter for follow-up examination after completed treatment for malignant neoplasm: Secondary | ICD-10-CM | POA: Diagnosis not present

## 2024-03-06 DIAGNOSIS — Z129 Encounter for screening for malignant neoplasm, site unspecified: Secondary | ICD-10-CM | POA: Diagnosis not present

## 2024-03-15 ENCOUNTER — Ambulatory Visit: Admitting: Family

## 2024-03-19 ENCOUNTER — Ambulatory Visit (INDEPENDENT_AMBULATORY_CARE_PROVIDER_SITE_OTHER): Admitting: Family

## 2024-03-19 VITALS — BP 136/77 | HR 54 | Temp 97.5°F | Resp 16 | Ht 66.0 in | Wt 231.0 lb

## 2024-03-19 DIAGNOSIS — H9313 Tinnitus, bilateral: Secondary | ICD-10-CM

## 2024-03-19 DIAGNOSIS — E782 Mixed hyperlipidemia: Secondary | ICD-10-CM | POA: Diagnosis not present

## 2024-03-19 DIAGNOSIS — I10 Essential (primary) hypertension: Secondary | ICD-10-CM

## 2024-03-19 DIAGNOSIS — Z8546 Personal history of malignant neoplasm of prostate: Secondary | ICD-10-CM | POA: Diagnosis not present

## 2024-03-19 DIAGNOSIS — G5603 Carpal tunnel syndrome, bilateral upper limbs: Secondary | ICD-10-CM

## 2024-03-19 DIAGNOSIS — M1A9XX0 Chronic gout, unspecified, without tophus (tophi): Secondary | ICD-10-CM | POA: Diagnosis not present

## 2024-03-19 DIAGNOSIS — Z6837 Body mass index (BMI) 37.0-37.9, adult: Secondary | ICD-10-CM

## 2024-03-19 LAB — LIPID PANEL
Cholesterol: 174 mg/dL (ref 0–200)
HDL: 42.4 mg/dL (ref >39.00)
LDL Cholesterol: 89 mg/dL (ref 0–99)
NonHDL: 131.77
Total CHOL/HDL Ratio: 4
Triglycerides: 215 mg/dL — ABNORMAL HIGH (ref 0.0–149.0)
VLDL: 43 mg/dL — ABNORMAL HIGH (ref 0.0–40.0)

## 2024-03-19 LAB — BASIC METABOLIC PANEL WITH GFR
BUN: 15 mg/dL (ref 6–23)
CO2: 27 meq/L (ref 19–32)
Calcium: 9.2 mg/dL (ref 8.4–10.5)
Chloride: 103 meq/L (ref 96–112)
Creatinine, Ser: 1.05 mg/dL (ref 0.40–1.50)
GFR: 71.24 mL/min (ref >60.00)
Glucose, Bld: 98 mg/dL (ref 70–99)
Potassium: 4.4 meq/L (ref 3.5–5.1)
Sodium: 136 meq/L (ref 135–145)

## 2024-03-19 NOTE — Assessment & Plan Note (Signed)
 Stable on amlodipine , hydralazine  and lisinopril .

## 2024-03-19 NOTE — Assessment & Plan Note (Signed)
 Continues to try to stay active.  When insurance coverage improves for GLP-1/OSA treatment, could consider.

## 2024-03-19 NOTE — Assessment & Plan Note (Signed)
 Lab Results  Component Value Date   CHOL 177 09/08/2023   HDL 41.60 09/08/2023   LDLCALC 104 (H) 09/08/2023   TRIG 159.0 (H) 09/08/2023   CHOLHDL 4 09/08/2023   Not on statin, update FLP today.

## 2024-03-19 NOTE — Assessment & Plan Note (Signed)
 Reports normal ENT evaluation. Recommend pink noise. Denies any issues with hearing.

## 2024-03-19 NOTE — Assessment & Plan Note (Signed)
 He is currently followed at [facility].  Lab Results  Component Value Date   PSA 0.00 (L) 07/25/2023   PSA 0.00 (L) 07/14/2021   PSA 0.00 Repeated and verified X2. (L) 07/03/2020

## 2024-03-19 NOTE — Assessment & Plan Note (Addendum)
 Had flare 2 weeks ago.  Continues allopurinol .  Declines Uric acid. Continue low purine diet.

## 2024-03-19 NOTE — Assessment & Plan Note (Signed)
 New. Recommend bilateral wrist splints at bedtime while sleeping and as able during the evening.

## 2024-03-19 NOTE — Patient Instructions (Signed)
  VISIT SUMMARY: During your visit, we reviewed your blood pressure, cholesterol, gout, restless leg syndrome, carpal tunnel syndrome, cervical spondylosis, tinnitus, and prostate cancer history. We discussed your current medications and made some adjustments to better manage your symptoms.  YOUR PLAN: -ESSENTIAL HYPERTENSION: Your blood pressure is well-controlled at 136/77 mmHg with your current medications. Continue taking amlodipine , hydralazine , and lisinopril  as prescribed.  -MIXED HYPERLIPIDEMIA: Mixed hyperlipidemia means you have high levels of different types of fats in your blood. We ordered a lipid panel and glucose test to check your cholesterol and blood sugar levels. Continue following your poultry and fish diet.  -CHRONIC GOUT: Chronic gout is a type of arthritis caused by high levels of uric acid in the blood. Continue taking allopurinol  100 mg twice daily and avoid foods that trigger gout flares, such as seafood, beer, and beef.  -RESTLESS LEGS SYNDROME: Restless legs syndrome causes an uncontrollable urge to move your legs. Since gabapentin  is causing grogginess and not helping, you should discontinue it. If your symptoms worsen, we may consider reintroducing gabapentin  at a lower dose.  -CARPAL TUNNEL SYNDROME, BILATERAL: Carpal tunnel syndrome is a condition that causes tingling and numbness in your hands. Use over-the-counter wrist splints at night and during activities. If your symptoms do not improve, we may refer you to an orthopedic specialist.  -CERVICAL SPONDYLOSIS: Cervical spondylosis is arthritis of the neck. We will monitor your symptoms, and if they worsen, we may need to do further evaluation.  -TINNITUS, BILATERAL: Tinnitus is a ringing or noise in the ears. Consider using a pink noise machine to help you sleep.  -PERSONAL HISTORY OF MALIGNANT NEOPLASM OF PROSTATE: Your PSA levels are stable, and you should continue occasional follow-ups with urology for  monitoring.  -GENERAL HEALTH MAINTENANCE: Your vaccinations are up to date except for tetanus. Please obtain a tetanus booster at the pharmacy.  INSTRUCTIONS: Please follow up with the recommended tests and obtain a tetanus booster at the pharmacy. If your symptoms worsen or do not improve, contact our office for further evaluation.

## 2024-03-19 NOTE — Progress Notes (Addendum)
 Subjective:     Patient ID: Michael [name], male    DOB: 1950/05/16, 73 y.o.   MRN: [id number]  Chief Complaint  Patient presents with   Hypertension    Here for follow up    HPI  Discussed the use of AI scribe software for clinical note transcription with the patient, who gave verbal consent to proceed.  History of Present Illness Michael [name] is a 73 year old male who presents for a regular follow-up visit.  His blood pressure is well-controlled with amlodipine  5 mg, hydralazine , and lisinopril . He has become more active since retiring, which he believes has positively impacted his blood pressure.  He experienced a gout flare approximately two weeks ago, which he attributes to increased physical activity rather than dietary triggers. He continues to take allopurinol  twice daily and manages flares by increasing water intake. He avoids foods known to aggravate gout, such as seafood and beer, and has significantly reduced beef consumption over the past 18 months.  He is considering discontinuing gabapentin , which he takes once daily for restless leg syndrome, due to side effects of morning grogginess. Gabapentin  has not been effective for his symptoms. He experiences tingling and circulation issues in his hands, which he suspects may be related to his sleeping position or carpal tunnel syndrome. An MRI of his cervical spine in January showed arthritis.  He mentions tinnitus that has worsened since having COVID-19, describing it as a 'cicada-like noise' that intensifies with elevated blood pressure. He has not had a hearing exam with an audiologist and has discussed the issue with an ENT, who suggested it might be related to jaw compression or stress.  His cholesterol was last checked in May, and he has been following a poultry and fish diet to manage his levels. He is not currently on a statin, as he did not tolerate it well in the past.      Health  Maintenance Due  Topic Date Due   DTaP/Tdap/Td (2 - Td or Tdap) 08/16/2023    Past Medical History:  Diagnosis Date   Allergy     Arthritis    Asthma    Blood transfusion without reported diagnosis    as a child   Cancer (HCC)    basal cell and squamous cell carcinoma   Cancer (HCC)    prostate   Cataract 2023   Clotting disorder    Colon polyps    COPD (chronic obstructive pulmonary disease) (HCC) 1975   Difficult airway for intubation 09/28/2022   2015 at [facility]   GERD (gastroesophageal reflux disease)    Gout    High cholesterol    History of hepatitis    due to drug interaction?   History of pancreatitis 2016   ?due to colchicine   History of pulmonary embolus (PE) 2016   History of stomach ulcers    Hypertension    Hypothyroidism    Lung nodule    Neuromuscular disorder (HCC)    restless leg   OSA (obstructive sleep apnea) 2015   has CPAP   Osteopenia    Pneumonia    Seizures (HCC)    x1 10 years ago   Sleep apnea    Thyroid  disease    hypothyroid   Ulcer 1985   Urinary incontinence     Past Surgical History:  Procedure Laterality Date   BASAL CELL CARCINOMA EXCISION  2017   MOHS.  beneath right eye   BRAIN SURGERY  [phone]  Fractured skull in an accident   CHOLECYSTECTOMY  2001   COLON SURGERY  2001   Complication of gall bladder surgery   COLONOSCOPY     multiple - last 06/2017   COLONOSCOPY WITH PROPOFOL  N/A 01/17/2023   Procedure: COLONOSCOPY WITH PROPOFOL ;  Surgeon: Avram Lupita BRAVO, MD;  Location: [facility];  Service: Gastroenterology;  Laterality: N/A;   COSMETIC SURGERY  2017   Basal cell cancer - MOHS surgery   HERNIA REPAIR  2001   HIP SURGERY     JOINT REPLACEMENT  06/2016   Total hip replacement - left hip   PENILE PROSTHESIS IMPLANT  2016   POLYPECTOMY  01/17/2023   Procedure: POLYPECTOMY;  Surgeon: Avram Lupita BRAVO, MD;  Location: [facility];  Service: Gastroenterology;;   PROSTATECTOMY  2009   SINOSCOPY     SINUS SURGERY  WITH INSTATRAK  2014   SQUAMOUS CELL CARCINOMA EXCISION  01/2017   nose   TOOTH EXTRACTION     TOTAL HIP ARTHROPLASTY  06/2016    Family History  Problem Relation Age of Onset   Ulcerative colitis Mother    Colon polyps Mother    Asthma Mother    Arthritis Mother    Skin cancer Mother        basal cell carcinoma   Kidney failure Mother    Atrial fibrillation Mother    Kidney disease Mother    Obesity Mother    Asthma Father    Arthritis Father    Diabetes Father    Heart attack Father    Hyperlipidemia Father    Hypertension Father    Kidney disease Father    Obesity Father    Diabetes Brother    Arthritis Brother    Hyperlipidemia Brother    Brain cancer Brother        GBM   Cancer Brother    Alcohol abuse Brother    Breast cancer Maternal Grandmother    Cancer Maternal Grandmother    Diabetes Paternal Grandfather    Colon cancer Neg Hx    Esophageal cancer Neg Hx    Liver cancer Neg Hx    Pancreatic cancer Neg Hx    Rectal cancer Neg Hx    Stomach cancer Neg Hx    Crohn's disease Neg Hx     Social History   Socioeconomic History   Marital status: Married    Spouse name: Not on file   Number of children: 3   Years of education: Not on file   Highest education level: Bachelor's degree (e.g., BA, AB, BS)  Occupational History   Not on file  Tobacco Use   Smoking status: Never    Passive exposure: Never   Smokeless tobacco: Never  Vaping Use   Vaping status: Never Used  Substance and Sexual Activity   Alcohol use: Yes    Alcohol/week: 3.0 standard drinks of alcohol    Types: 3 Glasses of wine per week    Comment: occ wine   Drug use: No   Sexual activity: Yes    Birth control/protection: Surgical    Comment: Prostate removal  Other Topics Concern   Not on file  Social History Narrative   Former Engineer, agricultural at [employer], now environmental health practitioner.    Married   3 daughters- all live locally   Has 7 grandchildren and 1 great  grandchild   Enjoys reading, environmental manager         Lives in a one story home  with a bonus room over the garage.   Social Drivers of Corporate Investment Banker Strain: Low Risk  (03/14/2024)   Overall Financial Resource Strain (CARDIA)    Difficulty of Paying Living Expenses: Not very hard  Food Insecurity: No Food Insecurity (03/14/2024)   Hunger Vital Sign    Worried About Running Out of Food in the Last Year: Never true    Ran Out of Food in the Last Year: Never true  Transportation Needs: No Transportation Needs (03/14/2024)   PRAPARE - Administrator, Civil Service (Medical): No    Lack of Transportation (Non-Medical): No  Physical Activity: Insufficiently Active (03/14/2024)   Exercise Vital Sign    Days of Exercise per Week: 3 days    Minutes of Exercise per Session: 40 min  Stress: Stress Concern Present (03/14/2024)   [employer] of Occupational Health - Occupational Stress Questionnaire    Feeling of Stress: To some extent  Social Connections: Moderately Integrated (03/14/2024)   Social Connection and Isolation Panel    Frequency of Communication with Friends and Family: More than three times a week    Frequency of Social Gatherings with Friends and Family: Twice a week    Attends Religious Services: More than 4 times per year    Active Member of [employer] or Organizations: No    Attends Engineer, Structural: Not on file    Marital Status: Married  Catering Manager Violence: Not At Risk (09/07/2023)   Humiliation, Afraid, Rape, and Kick questionnaire    Fear of Current or Ex-Partner: No    Emotionally Abused: No    Physically Abused: No    Sexually Abused: No    Outpatient Medications Prior to Visit  Medication Sig Dispense Refill   albuterol  (VENTOLIN  HFA) 108 (90 Base) MCG/ACT inhaler Inhale 2 puffs into the lungs every 6 (six) hours as needed for wheezing or shortness of breath. 18 g 5   allopurinol  (ZYLOPRIM ) 100 MG tablet Take 1 tablet  (100 mg total) by mouth 2 (two) times daily. 180 tablet 1   amLODipine  (NORVASC ) 5 MG tablet TAKE 1 TABLET BY MOUTH EVERYDAY AT BEDTIME 90 tablet 1   calcium carbonate (OS-CAL - DOSED IN MG OF ELEMENTAL CALCIUM) 1250 (500 Ca) MG tablet Take 1 tablet by mouth daily.     clindamycin  (CLEOCIN ) 300 MG capsule Take 300 mg by mouth 3 (three) times daily.     diphenhydrAMINE  (BENADRYL ) 25 mg capsule Take 25 mg by mouth every 6 (six) hours as needed.     docusate sodium (COLACE) 100 MG capsule Take 200 mg by mouth daily.      EPINEPHrine  0.3 mg/0.3 mL IJ SOAJ injection Use as directed for severe allergic reactions 2 each 2   fexofenadine (ALLEGRA) 180 MG tablet Take 180 mg by mouth daily.      fluticasone  (FLONASE ) 50 MCG/ACT nasal spray SPRAY 2 SPRAYS INTO EACH NOSTRIL EVERY DAY 48 mL 1   fluticasone -salmeterol (WIXELA INHUB) 500-50 MCG/ACT AEPB Inhale 1 puff into the lungs in the morning and at bedtime. 60 each 11   gabapentin  (NEURONTIN ) 300 MG capsule TAKE 1 CAPSULE BY MOUTH EVERYDAY AT BEDTIME 90 capsule 0   hydrALAZINE  (APRESOLINE ) 25 MG tablet TAKE 1 TABLET (25 MG) BY MOUTH IN THE MORNING AND AT BEDTIME 180 tablet 1   ipratropium-albuterol  (DUONEB) 0.5-2.5 (3) MG/3ML SOLN 1 vial in nebulizer every 4-6 hours while awake for the next 3-5 days for coughing or wheezing. 360  mL 1   levothyroxine  (SYNTHROID ) 75 MCG tablet TAKE 1 TABLET BY MOUTH DAILY BEFORE BREAKFAST. 90 tablet 1   lisinopril  (ZESTRIL ) 10 MG tablet TAKE 1 TABLET BY MOUTH EVERY DAY 90 tablet 0   Magnesium 250 MG TABS Take by mouth daily.     montelukast  (SINGULAIR ) 10 MG tablet TAKE ONE TABLET ONCE AT NIGHT FOR COUGHING OR WHEEZING 90 tablet 0   Tezepelumab -ekko (TEZSPIRE ) 210 MG/1. SOAJ Inject 210 mg into the skin every 28 (twenty-eight) days. 1.91 mL 11   tiZANidine  (ZANAFLEX ) 2 MG tablet TAKE 1 TO 2 TABLETS(2 TO 4 MG) BY MOUTH AT BEDTIME AS NEEDED FOR MUSCLE SPASMS 60 tablet 1   Facility-Administered Medications Prior to Visit   Medication Dose Route Frequency Provider Last Rate Last Admin   tezepelumab -ekko (TEZSPIRE ) 210 MG/1. syringe 210 mg  210 mg Subcutaneous Q28 days    210 mg at 10/20/23 0920    Allergies  Allergen Reactions   Baclofen  Shortness Of Breath    Asthma exacerbation   Colchicine Other (See Comments)    Pancreatitis   Doxycycline Hives   Statins Other (See Comments)    Joint stiffness   Avelox [Moxifloxacin Hcl In Nacl] Other (See Comments)    SEIZURE   Bactrim  [Sulfamethoxazole -Trimethoprim ] Hives   Cefdinir  Hives   Losartan      Rash    Oregano [Origanum Oil] Cough   Peanut -Containing Drug Products Swelling    Throat swelling   Adhesive  [Tape] Other (See Comments)    tears skin   Penicillins Rash    ROS    See HPI Objective:    Physical Exam Constitutional:      General: He is not in acute distress.    Appearance: He is well-developed.  HENT:     Head: Normocephalic and atraumatic.  Cardiovascular:     Rate and Rhythm: Normal rate and regular rhythm.     Heart sounds: No murmur heard. Pulmonary:     Effort: Pulmonary effort is normal. No respiratory distress.     Breath sounds: No wheezing or rales.     Comments: Mildly decreased breath sounds at bases Skin:    General: Skin is warm and dry.  Neurological:     Mental Status: He is alert and oriented to person, place, and time.     Comments: Neg tinels bilaterally, + Phalans bilaterally.   Psychiatric:        Behavior: Behavior normal.        Thought Content: Thought content normal.      BP 136/77 (BP Location: Right Arm, Patient Position: Sitting, Cuff Size: Normal)   Pulse (!) 54   Temp (!) 97.5 F (36.4 C) (Oral)   Resp 16   Ht 5' 6 (1.676 m)   Wt 231 lb (104.8 kg)   SpO2 97%   BMI 37.28 kg/m  Wt Readings from Last 3 Encounters:  03/19/24 231 lb (104.8 kg)  11/20/23 231 lb 1.6 oz (104.8 kg)  10/25/23 228 lb (103.4 kg)       Assessment & Plan:   Problem List Items Addressed This Visit        Unprioritized   Hypertension - Primary (Chronic)   Stable on amlodipine , hydralazine  and lisinopril .       Tinnitus aurium, bilateral   Reports normal ENT evaluation. Recommend pink noise. Denies any issues with hearing.      Obesity, morbid (HCC)   Continues to try to stay active.  When insurance coverage improves  for GLP-1/OSA treatment, could consider. Associated comorbitity:  HTN.      Hyperlipidemia   Lab Results  Component Value Date   CHOL 177 09/08/2023   HDL 41.60 09/08/2023   LDLCALC 104 (H) 09/08/2023   TRIG 159.0 (H) 09/08/2023   CHOLHDL 4 09/08/2023   Not on statin, update FLP today.       Relevant Orders   Lipid panel (Completed)   Basic Metabolic Panel (BMET) (Completed)   History of prostate cancer   He is currently followed at [facility].  Lab Results  Component Value Date   PSA 0.00 (L) 07/25/2023   PSA 0.00 (L) 07/14/2021   PSA 0.00 Repeated and verified X2. (L) 07/03/2020          Gout   Had flare 2 weeks ago.  Continues allopurinol .  Declines Uric acid. Continue low purine diet.      Bilateral carpal tunnel syndrome   New. Recommend bilateral wrist splints at bedtime while sleeping and as able during the evening.       I am having Michael [name]. Michael [name] maintain his docusate sodium, fexofenadine, Magnesium, calcium carbonate, tiZANidine , diphenhydrAMINE , clindamycin , EPINEPHrine , ipratropium-albuterol , fluticasone -salmeterol, allopurinol , albuterol , levothyroxine , fluticasone , hydrALAZINE , montelukast , amLODipine , Tezspire , lisinopril , and gabapentin . We will continue to administer tezepelumab -ekko.  No orders of the defined types were placed in this encounter.

## 2024-03-20 ENCOUNTER — Ambulatory Visit: Payer: Self-pay | Admitting: Family

## 2024-03-20 ENCOUNTER — Other Ambulatory Visit: Payer: Self-pay | Admitting: Family

## 2024-03-20 DIAGNOSIS — E781 Pure hyperglyceridemia: Secondary | ICD-10-CM

## 2024-03-20 MED ORDER — FISH OIL 1000 MG PO CAPS: 2000.0000 mg | ORAL_CAPSULE | Freq: Two times a day (BID) | ORAL | Status: AC

## 2024-03-25 ENCOUNTER — Other Ambulatory Visit: Payer: Self-pay

## 2024-03-27 ENCOUNTER — Other Ambulatory Visit: Payer: Self-pay

## 2024-03-27 NOTE — Progress Notes (Signed)
 Specialty Pharmacy Refill Coordination Note  Michael [name] is a 73 y.o. male contacted today regarding refills of specialty medication(s) Tezepelumab -ekko (Tezspire )   Patient requested Delivery   Delivery date: 04/02/24   Verified address: [address], [place], [place] [address]   Medication will be filled on: 04/01/24

## 2024-03-30 ENCOUNTER — Other Ambulatory Visit (HOSPITAL_BASED_OUTPATIENT_CLINIC_OR_DEPARTMENT_OTHER): Payer: Self-pay | Admitting: Primary Care

## 2024-04-01 ENCOUNTER — Other Ambulatory Visit: Payer: Self-pay

## 2024-04-01 ENCOUNTER — Encounter: Payer: Self-pay | Admitting: Family

## 2024-04-01 ENCOUNTER — Other Ambulatory Visit: Payer: Self-pay | Admitting: Family

## 2024-04-01 DIAGNOSIS — G2581 Restless legs syndrome: Secondary | ICD-10-CM

## 2024-04-01 DIAGNOSIS — M109 Gout, unspecified: Secondary | ICD-10-CM

## 2024-04-01 MED ORDER — GABAPENTIN 300 MG PO CAPS: 300.0000 mg | ORAL_CAPSULE | Freq: Every day | ORAL | 0 refills | Status: AC

## 2024-04-02 ENCOUNTER — Other Ambulatory Visit (HOSPITAL_BASED_OUTPATIENT_CLINIC_OR_DEPARTMENT_OTHER): Payer: Self-pay | Admitting: Internal Medicine

## 2024-04-02 ENCOUNTER — Other Ambulatory Visit: Payer: Self-pay | Admitting: Family

## 2024-04-02 DIAGNOSIS — J4550 Severe persistent asthma, uncomplicated: Secondary | ICD-10-CM

## 2024-04-22 ENCOUNTER — Other Ambulatory Visit: Payer: Self-pay

## 2024-04-23 ENCOUNTER — Other Ambulatory Visit: Payer: Self-pay

## 2024-04-23 NOTE — Progress Notes (Signed)
 Specialty Pharmacy Refill Coordination Note  Michael [name] is a 73 y.o. male contacted today regarding refills of specialty medication(s) Tezepelumab -ekko (Tezspire )   Patient requested Delivery   Delivery date: 04/24/24   Verified address: [address], [place], [place] [address]   Medication will be filled on: 04/23/24   Next injection: 1.9.26

## 2024-05-11 ENCOUNTER — Other Ambulatory Visit: Payer: Self-pay | Admitting: Family

## 2024-05-17 ENCOUNTER — Other Ambulatory Visit: Payer: Self-pay

## 2024-05-19 ENCOUNTER — Other Ambulatory Visit: Payer: Self-pay | Admitting: Family

## 2024-05-20 ENCOUNTER — Encounter (INDEPENDENT_AMBULATORY_CARE_PROVIDER_SITE_OTHER): Payer: Self-pay

## 2024-05-21 ENCOUNTER — Ambulatory Visit: Admitting: Family

## 2024-05-21 ENCOUNTER — Other Ambulatory Visit (HOSPITAL_COMMUNITY): Payer: Self-pay

## 2024-05-21 ENCOUNTER — Other Ambulatory Visit: Payer: Self-pay

## 2024-05-21 NOTE — Progress Notes (Signed)
 Specialty Pharmacy Refill Coordination Note  Michael [name] is a 74 y.o. male contacted today regarding refills of specialty medication(s) Tezepelumab -ekko (Tezspire )   Patient requested Delivery   Delivery date: 05/28/24   Verified address: [address], [place], [place] [address]   Medication will be filled on: 05/27/24

## 2024-05-22 ENCOUNTER — Other Ambulatory Visit: Payer: Self-pay

## 2024-05-22 NOTE — Progress Notes (Signed)
 Patient was contacted by the pharmacy regarding their specialty medication(s) Tezepelumab -ekko (Tezspire ) to reschedule an earlier delivery date, due to impending winter weather conditions. Medication(s) will be filled 05/23/24 for a delivery by 05/24/24

## 2024-05-23 ENCOUNTER — Other Ambulatory Visit: Payer: Self-pay

## 2024-05-28 ENCOUNTER — Other Ambulatory Visit: Payer: Self-pay | Admitting: Family

## 2024-05-30 ENCOUNTER — Telehealth (HOSPITAL_BASED_OUTPATIENT_CLINIC_OR_DEPARTMENT_OTHER): Payer: Self-pay

## 2024-05-30 ENCOUNTER — Telehealth: Payer: Self-pay | Admitting: *Deleted

## 2024-05-30 MED ORDER — PREDNISONE 20 MG PO TABS: 40.0000 mg | ORAL_TABLET | Freq: Every day | ORAL | 0 refills | Status: AC

## 2024-05-30 NOTE — Telephone Encounter (Signed)
 Pt has been informed- Rx sent. He will let us  know if there is no improvement.

## 2024-05-30 NOTE — Telephone Encounter (Signed)
 Pt notified that he needs an appt as he had not been since since 2024. Appt made for pt NFN   Copied from CRM #[id number]. Topic: Clinical - Order For Equipment >> May 29, 2024  1:37 PM Michael [name] wrote: Reason for CRM: pt is requesting copy of CPAP prescription as she is wanting to order one for travel , he is asking to have the copy sent to his email [email or web address] or for someone to call him and let him know when he come pick up the prescription

## 2024-05-30 NOTE — Telephone Encounter (Signed)
 Please have him take 40 mg of prednisone  for 5 days. Use his albuterol  4 puffs every 4 hours while awake. If he gets worse or not improving, he will need to be evaluated in person.

## 2024-05-30 NOTE — Telephone Encounter (Signed)
 Michael [name] called and said he has had an asthma flare since Tuesday. He is using Duonebs BID, Wixela BID, Allegra BID, and he started a prednisone  taper Tuesday, that he had on hand, 10 mg (Taper is 30mg , 30mg , 20mg , 20mg , 10mg , 10mg ) He is due for his Tezspire  tomorrow at home. He's not sure what else to do. He denies fever and says it is normal every time he takes his temperature but at night it feels like he has the chills. This all started after he was outside shoveling the snow. Dr Lorin is out of office.

## 2024-06-10 ENCOUNTER — Ambulatory Visit (HOSPITAL_BASED_OUTPATIENT_CLINIC_OR_DEPARTMENT_OTHER)

## 2024-09-12 ENCOUNTER — Ambulatory Visit

## 2024-09-13 ENCOUNTER — Ambulatory Visit

## 2024-10-09 ENCOUNTER — Ambulatory Visit: Admitting: Family
